# Patient Record
Sex: Female | Born: 1950
Health system: Southern US, Community
[De-identification: ages and names within clinical notes are randomized; demographics above are authoritative.]

## PROBLEM LIST (undated history)

## (undated) DIAGNOSIS — E7800 Pure hypercholesterolemia, unspecified: Secondary | ICD-10-CM

## (undated) DIAGNOSIS — E039 Hypothyroidism, unspecified: Secondary | ICD-10-CM

## (undated) DIAGNOSIS — E079 Disorder of thyroid, unspecified: Secondary | ICD-10-CM

## (undated) DIAGNOSIS — E871 Hypo-osmolality and hyponatremia: Secondary | ICD-10-CM

## (undated) DIAGNOSIS — G3109 Other frontotemporal dementia: Secondary | ICD-10-CM

## (undated) DIAGNOSIS — F0390 Unspecified dementia without behavioral disturbance: Secondary | ICD-10-CM

## (undated) DIAGNOSIS — E119 Type 2 diabetes mellitus without complications: Secondary | ICD-10-CM

## (undated) DIAGNOSIS — F02818 Dementia in other diseases classified elsewhere, unspecified severity, with other behavioral disturbance: Secondary | ICD-10-CM

## (undated) DIAGNOSIS — I10 Essential (primary) hypertension: Secondary | ICD-10-CM

## (undated) HISTORY — DX: Hypothyroidism, unspecified: E03.9

## (undated) HISTORY — DX: Hypo-osmolality and hyponatremia: E87.1

## (undated) HISTORY — DX: Other frontotemporal neurocognitive disorder: G31.09

## (undated) HISTORY — DX: Essential (primary) hypertension: I10

## (undated) HISTORY — DX: Unspecified dementia, unspecified severity, without behavioral disturbance, psychotic disturbance, mood disturbance, and anxiety: F03.90

## (undated) HISTORY — DX: Dementia in other diseases classified elsewhere, unspecified severity, with other behavioral disturbance: F02.818

## (undated) HISTORY — DX: Pure hypercholesterolemia, unspecified: E78.00

---

## 2015-04-21 DIAGNOSIS — Z1289 Encounter for screening for malignant neoplasm of other sites: Secondary | ICD-10-CM | POA: Diagnosis not present

## 2015-04-21 DIAGNOSIS — E7800 Pure hypercholesterolemia, unspecified: Secondary | ICD-10-CM | POA: Diagnosis not present

## 2015-04-21 DIAGNOSIS — E119 Type 2 diabetes mellitus without complications: Secondary | ICD-10-CM | POA: Diagnosis not present

## 2015-04-21 DIAGNOSIS — R319 Hematuria, unspecified: Secondary | ICD-10-CM | POA: Diagnosis not present

## 2015-04-21 DIAGNOSIS — I10 Essential (primary) hypertension: Secondary | ICD-10-CM | POA: Diagnosis not present

## 2015-04-21 DIAGNOSIS — E039 Hypothyroidism, unspecified: Secondary | ICD-10-CM | POA: Diagnosis not present

## 2015-04-21 DIAGNOSIS — K5790 Diverticulosis of intestine, part unspecified, without perforation or abscess without bleeding: Secondary | ICD-10-CM | POA: Diagnosis not present

## 2015-04-21 DIAGNOSIS — Z0000 Encounter for general adult medical examination without abnormal findings: Secondary | ICD-10-CM | POA: Diagnosis not present

## 2015-04-21 DIAGNOSIS — K219 Gastro-esophageal reflux disease without esophagitis: Secondary | ICD-10-CM | POA: Diagnosis not present

## 2015-07-27 DIAGNOSIS — S0501XA Injury of conjunctiva and corneal abrasion without foreign body, right eye, initial encounter: Secondary | ICD-10-CM | POA: Diagnosis not present

## 2015-07-28 DIAGNOSIS — S0501XD Injury of conjunctiva and corneal abrasion without foreign body, right eye, subsequent encounter: Secondary | ICD-10-CM | POA: Diagnosis not present

## 2015-07-31 DIAGNOSIS — B0052 Herpesviral keratitis: Secondary | ICD-10-CM | POA: Diagnosis not present

## 2015-08-02 DIAGNOSIS — B0052 Herpesviral keratitis: Secondary | ICD-10-CM | POA: Diagnosis not present

## 2015-08-24 DIAGNOSIS — E119 Type 2 diabetes mellitus without complications: Secondary | ICD-10-CM | POA: Diagnosis not present

## 2015-08-24 DIAGNOSIS — D696 Thrombocytopenia, unspecified: Secondary | ICD-10-CM | POA: Diagnosis not present

## 2015-08-24 DIAGNOSIS — E039 Hypothyroidism, unspecified: Secondary | ICD-10-CM | POA: Diagnosis not present

## 2015-08-24 DIAGNOSIS — I10 Essential (primary) hypertension: Secondary | ICD-10-CM | POA: Diagnosis not present

## 2015-11-18 DIAGNOSIS — Z23 Encounter for immunization: Secondary | ICD-10-CM | POA: Diagnosis not present

## 2015-12-31 DIAGNOSIS — E119 Type 2 diabetes mellitus without complications: Secondary | ICD-10-CM | POA: Diagnosis not present

## 2015-12-31 DIAGNOSIS — E7800 Pure hypercholesterolemia, unspecified: Secondary | ICD-10-CM | POA: Diagnosis not present

## 2015-12-31 DIAGNOSIS — E039 Hypothyroidism, unspecified: Secondary | ICD-10-CM | POA: Diagnosis not present

## 2015-12-31 DIAGNOSIS — I10 Essential (primary) hypertension: Secondary | ICD-10-CM | POA: Diagnosis not present

## 2015-12-31 DIAGNOSIS — D696 Thrombocytopenia, unspecified: Secondary | ICD-10-CM | POA: Diagnosis not present

## 2016-12-07 DIAGNOSIS — E119 Type 2 diabetes mellitus without complications: Secondary | ICD-10-CM | POA: Diagnosis not present

## 2016-12-07 DIAGNOSIS — Z23 Encounter for immunization: Secondary | ICD-10-CM | POA: Diagnosis not present

## 2016-12-07 DIAGNOSIS — E7800 Pure hypercholesterolemia, unspecified: Secondary | ICD-10-CM | POA: Diagnosis not present

## 2016-12-07 DIAGNOSIS — I10 Essential (primary) hypertension: Secondary | ICD-10-CM | POA: Diagnosis not present

## 2016-12-07 DIAGNOSIS — E039 Hypothyroidism, unspecified: Secondary | ICD-10-CM | POA: Diagnosis not present

## 2016-12-07 DIAGNOSIS — Z0000 Encounter for general adult medical examination without abnormal findings: Secondary | ICD-10-CM | POA: Diagnosis not present

## 2017-01-19 DIAGNOSIS — R5383 Other fatigue: Secondary | ICD-10-CM | POA: Diagnosis not present

## 2017-01-19 DIAGNOSIS — E039 Hypothyroidism, unspecified: Secondary | ICD-10-CM | POA: Diagnosis not present

## 2017-01-19 DIAGNOSIS — E119 Type 2 diabetes mellitus without complications: Secondary | ICD-10-CM | POA: Diagnosis not present

## 2017-01-19 DIAGNOSIS — E7800 Pure hypercholesterolemia, unspecified: Secondary | ICD-10-CM | POA: Diagnosis not present

## 2017-01-19 DIAGNOSIS — I10 Essential (primary) hypertension: Secondary | ICD-10-CM | POA: Diagnosis not present

## 2017-08-21 ENCOUNTER — Other Ambulatory Visit: Payer: Self-pay

## 2017-08-21 NOTE — Patient Outreach (Signed)
[facility] Care Management  08/21/2017  Alexis Becker 01-Jan-1951 [id number]   Medication Adherence call to Alexis Becker left a message for patient to call back patient is due on Metformin Er 500 mg and Simvastatin 20 mg. Alexis Becker is showing past due under [facility].  Alexis AbedAna Ollison-Moran CPhT Pharmacy Technician [facility] Care Management Direct Dial [phone]  Fax [phone] [email or web address]

## 2017-09-20 DIAGNOSIS — E1169 Type 2 diabetes mellitus with other specified complication: Secondary | ICD-10-CM | POA: Diagnosis not present

## 2017-09-20 DIAGNOSIS — R5383 Other fatigue: Secondary | ICD-10-CM | POA: Diagnosis not present

## 2017-09-20 DIAGNOSIS — D696 Thrombocytopenia, unspecified: Secondary | ICD-10-CM | POA: Diagnosis not present

## 2017-09-20 DIAGNOSIS — E039 Hypothyroidism, unspecified: Secondary | ICD-10-CM | POA: Diagnosis not present

## 2017-09-20 DIAGNOSIS — E7800 Pure hypercholesterolemia, unspecified: Secondary | ICD-10-CM | POA: Diagnosis not present

## 2017-09-20 DIAGNOSIS — I10 Essential (primary) hypertension: Secondary | ICD-10-CM | POA: Diagnosis not present

## 2017-10-11 DIAGNOSIS — Z1231 Encounter for screening mammogram for malignant neoplasm of breast: Secondary | ICD-10-CM | POA: Diagnosis not present

## 2017-10-11 DIAGNOSIS — Z89619 Acquired absence of unspecified leg above knee: Secondary | ICD-10-CM | POA: Diagnosis not present

## 2017-10-11 DIAGNOSIS — E1151 Type 2 diabetes mellitus with diabetic peripheral angiopathy without gangrene: Secondary | ICD-10-CM | POA: Diagnosis not present

## 2017-10-22 ENCOUNTER — Other Ambulatory Visit: Payer: Self-pay

## 2017-10-22 NOTE — Patient Outreach (Signed)
[facility]) Care Management  10/22/2017  Alexis Becker [birth year] [id number]   Medication Adherence call to Alexis Becker left a message for patient to call back patient is due on Metformin Er 500 mg and Simvastatin 20 mg. Patient is showing past due under [facility] Ins.   Lillia Abed CPhT Pharmacy Technician [facility] Care Management Direct Dial [phone]  Fax [phone] [name]

## 2017-10-26 DIAGNOSIS — I63511 Cerebral infarction due to unspecified occlusion or stenosis of right middle cerebral artery: Secondary | ICD-10-CM | POA: Diagnosis not present

## 2017-10-26 DIAGNOSIS — G9340 Encephalopathy, unspecified: Secondary | ICD-10-CM | POA: Diagnosis not present

## 2017-10-26 DIAGNOSIS — G319 Degenerative disease of nervous system, unspecified: Secondary | ICD-10-CM | POA: Diagnosis not present

## 2017-12-20 DIAGNOSIS — Z0000 Encounter for general adult medical examination without abnormal findings: Secondary | ICD-10-CM | POA: Diagnosis not present

## 2017-12-20 DIAGNOSIS — E1169 Type 2 diabetes mellitus with other specified complication: Secondary | ICD-10-CM | POA: Diagnosis not present

## 2017-12-20 DIAGNOSIS — Z23 Encounter for immunization: Secondary | ICD-10-CM | POA: Diagnosis not present

## 2017-12-20 DIAGNOSIS — E039 Hypothyroidism, unspecified: Secondary | ICD-10-CM | POA: Diagnosis not present

## 2017-12-20 DIAGNOSIS — E7800 Pure hypercholesterolemia, unspecified: Secondary | ICD-10-CM | POA: Diagnosis not present

## 2018-02-22 DIAGNOSIS — D696 Thrombocytopenia, unspecified: Secondary | ICD-10-CM | POA: Diagnosis not present

## 2018-02-22 DIAGNOSIS — E1169 Type 2 diabetes mellitus with other specified complication: Secondary | ICD-10-CM | POA: Diagnosis not present

## 2018-02-22 DIAGNOSIS — E039 Hypothyroidism, unspecified: Secondary | ICD-10-CM | POA: Diagnosis not present

## 2018-02-22 DIAGNOSIS — E7800 Pure hypercholesterolemia, unspecified: Secondary | ICD-10-CM | POA: Diagnosis not present

## 2018-02-22 DIAGNOSIS — R5383 Other fatigue: Secondary | ICD-10-CM | POA: Diagnosis not present

## 2018-02-22 DIAGNOSIS — I10 Essential (primary) hypertension: Secondary | ICD-10-CM | POA: Diagnosis not present

## 2018-02-22 DIAGNOSIS — B356 Tinea cruris: Secondary | ICD-10-CM | POA: Diagnosis not present

## 2018-03-06 DIAGNOSIS — L03314 Cellulitis of groin: Secondary | ICD-10-CM | POA: Diagnosis not present

## 2018-03-06 DIAGNOSIS — J189 Pneumonia, unspecified organism: Secondary | ICD-10-CM | POA: Diagnosis not present

## 2018-03-06 DIAGNOSIS — R112 Nausea with vomiting, unspecified: Secondary | ICD-10-CM | POA: Diagnosis not present

## 2018-03-06 DIAGNOSIS — R9089 Other abnormal findings on diagnostic imaging of central nervous system: Secondary | ICD-10-CM | POA: Diagnosis not present

## 2018-03-06 DIAGNOSIS — R509 Fever, unspecified: Secondary | ICD-10-CM | POA: Diagnosis not present

## 2018-03-06 DIAGNOSIS — Z7984 Long term (current) use of oral hypoglycemic drugs: Secondary | ICD-10-CM | POA: Diagnosis not present

## 2018-03-06 DIAGNOSIS — I10 Essential (primary) hypertension: Secondary | ICD-10-CM | POA: Diagnosis not present

## 2018-03-06 DIAGNOSIS — E1151 Type 2 diabetes mellitus with diabetic peripheral angiopathy without gangrene: Secondary | ICD-10-CM | POA: Diagnosis not present

## 2018-03-06 DIAGNOSIS — E871 Hypo-osmolality and hyponatremia: Secondary | ICD-10-CM | POA: Diagnosis not present

## 2018-03-06 DIAGNOSIS — E512 Wernicke's encephalopathy: Secondary | ICD-10-CM | POA: Diagnosis not present

## 2018-03-06 DIAGNOSIS — L03115 Cellulitis of right lower limb: Secondary | ICD-10-CM | POA: Diagnosis not present

## 2018-03-06 DIAGNOSIS — E785 Hyperlipidemia, unspecified: Secondary | ICD-10-CM | POA: Diagnosis not present

## 2018-03-06 DIAGNOSIS — Z7982 Long term (current) use of aspirin: Secondary | ICD-10-CM | POA: Diagnosis not present

## 2018-03-06 DIAGNOSIS — M79604 Pain in right leg: Secondary | ICD-10-CM | POA: Diagnosis not present

## 2018-03-06 DIAGNOSIS — Z87891 Personal history of nicotine dependence: Secondary | ICD-10-CM | POA: Diagnosis not present

## 2018-03-06 DIAGNOSIS — Z66 Do not resuscitate: Secondary | ICD-10-CM | POA: Diagnosis not present

## 2018-03-06 DIAGNOSIS — L03319 Cellulitis of trunk, unspecified: Secondary | ICD-10-CM | POA: Diagnosis not present

## 2018-03-06 DIAGNOSIS — Z743 Need for continuous supervision: Secondary | ICD-10-CM | POA: Diagnosis not present

## 2018-03-06 DIAGNOSIS — R410 Disorientation, unspecified: Secondary | ICD-10-CM | POA: Diagnosis not present

## 2018-03-06 DIAGNOSIS — Z89612 Acquired absence of left leg above knee: Secondary | ICD-10-CM | POA: Diagnosis not present

## 2018-03-06 DIAGNOSIS — R0902 Hypoxemia: Secondary | ICD-10-CM | POA: Diagnosis not present

## 2018-03-06 DIAGNOSIS — E1165 Type 2 diabetes mellitus with hyperglycemia: Secondary | ICD-10-CM | POA: Diagnosis not present

## 2018-03-06 DIAGNOSIS — M79605 Pain in left leg: Secondary | ICD-10-CM | POA: Diagnosis not present

## 2018-03-06 DIAGNOSIS — E039 Hypothyroidism, unspecified: Secondary | ICD-10-CM | POA: Diagnosis not present

## 2018-03-06 DIAGNOSIS — Z79899 Other long term (current) drug therapy: Secondary | ICD-10-CM | POA: Diagnosis not present

## 2018-03-06 DIAGNOSIS — Z89619 Acquired absence of unspecified leg above knee: Secondary | ICD-10-CM | POA: Diagnosis not present

## 2018-03-06 DIAGNOSIS — R404 Transient alteration of awareness: Secondary | ICD-10-CM | POA: Diagnosis not present

## 2018-03-06 DIAGNOSIS — E876 Hypokalemia: Secondary | ICD-10-CM | POA: Diagnosis not present

## 2018-03-27 DIAGNOSIS — Z9181 History of falling: Secondary | ICD-10-CM | POA: Diagnosis not present

## 2018-03-27 DIAGNOSIS — M6281 Muscle weakness (generalized): Secondary | ICD-10-CM | POA: Diagnosis not present

## 2018-03-27 DIAGNOSIS — R278 Other lack of coordination: Secondary | ICD-10-CM | POA: Diagnosis not present

## 2018-03-28 DIAGNOSIS — R2681 Unsteadiness on feet: Secondary | ICD-10-CM | POA: Diagnosis not present

## 2018-03-28 DIAGNOSIS — Z9181 History of falling: Secondary | ICD-10-CM | POA: Diagnosis not present

## 2018-03-28 DIAGNOSIS — R278 Other lack of coordination: Secondary | ICD-10-CM | POA: Diagnosis not present

## 2018-03-28 DIAGNOSIS — R488 Other symbolic dysfunctions: Secondary | ICD-10-CM | POA: Diagnosis not present

## 2018-03-29 DIAGNOSIS — R278 Other lack of coordination: Secondary | ICD-10-CM | POA: Diagnosis not present

## 2018-03-29 DIAGNOSIS — Z9181 History of falling: Secondary | ICD-10-CM | POA: Diagnosis not present

## 2018-03-29 DIAGNOSIS — M6281 Muscle weakness (generalized): Secondary | ICD-10-CM | POA: Diagnosis not present

## 2018-04-01 DIAGNOSIS — M6281 Muscle weakness (generalized): Secondary | ICD-10-CM | POA: Diagnosis not present

## 2018-04-01 DIAGNOSIS — R278 Other lack of coordination: Secondary | ICD-10-CM | POA: Diagnosis not present

## 2018-04-01 DIAGNOSIS — Z9181 History of falling: Secondary | ICD-10-CM | POA: Diagnosis not present

## 2018-04-02 DIAGNOSIS — Z9181 History of falling: Secondary | ICD-10-CM | POA: Diagnosis not present

## 2018-04-02 DIAGNOSIS — R488 Other symbolic dysfunctions: Secondary | ICD-10-CM | POA: Diagnosis not present

## 2018-04-02 DIAGNOSIS — R278 Other lack of coordination: Secondary | ICD-10-CM | POA: Diagnosis not present

## 2018-04-02 DIAGNOSIS — R2681 Unsteadiness on feet: Secondary | ICD-10-CM | POA: Diagnosis not present

## 2018-04-03 DIAGNOSIS — R488 Other symbolic dysfunctions: Secondary | ICD-10-CM | POA: Diagnosis not present

## 2018-04-03 DIAGNOSIS — Z9181 History of falling: Secondary | ICD-10-CM | POA: Diagnosis not present

## 2018-04-03 DIAGNOSIS — R278 Other lack of coordination: Secondary | ICD-10-CM | POA: Diagnosis not present

## 2018-04-03 DIAGNOSIS — R2681 Unsteadiness on feet: Secondary | ICD-10-CM | POA: Diagnosis not present

## 2018-04-03 DIAGNOSIS — Z89512 Acquired absence of left leg below knee: Secondary | ICD-10-CM | POA: Diagnosis not present

## 2018-04-03 DIAGNOSIS — Z993 Dependence on wheelchair: Secondary | ICD-10-CM | POA: Diagnosis not present

## 2018-04-04 DIAGNOSIS — Z9181 History of falling: Secondary | ICD-10-CM | POA: Diagnosis not present

## 2018-04-04 DIAGNOSIS — M6281 Muscle weakness (generalized): Secondary | ICD-10-CM | POA: Diagnosis not present

## 2018-04-04 DIAGNOSIS — R278 Other lack of coordination: Secondary | ICD-10-CM | POA: Diagnosis not present

## 2018-04-05 DIAGNOSIS — Z9181 History of falling: Secondary | ICD-10-CM | POA: Diagnosis not present

## 2018-04-05 DIAGNOSIS — M6281 Muscle weakness (generalized): Secondary | ICD-10-CM | POA: Diagnosis not present

## 2018-04-05 DIAGNOSIS — R278 Other lack of coordination: Secondary | ICD-10-CM | POA: Diagnosis not present

## 2018-04-08 DIAGNOSIS — M6281 Muscle weakness (generalized): Secondary | ICD-10-CM | POA: Diagnosis not present

## 2018-04-08 DIAGNOSIS — R278 Other lack of coordination: Secondary | ICD-10-CM | POA: Diagnosis not present

## 2018-04-08 DIAGNOSIS — Z9181 History of falling: Secondary | ICD-10-CM | POA: Diagnosis not present

## 2018-04-09 DIAGNOSIS — R278 Other lack of coordination: Secondary | ICD-10-CM | POA: Diagnosis not present

## 2018-04-09 DIAGNOSIS — R2681 Unsteadiness on feet: Secondary | ICD-10-CM | POA: Diagnosis not present

## 2018-04-09 DIAGNOSIS — R488 Other symbolic dysfunctions: Secondary | ICD-10-CM | POA: Diagnosis not present

## 2018-04-09 DIAGNOSIS — Z9181 History of falling: Secondary | ICD-10-CM | POA: Diagnosis not present

## 2018-04-10 DIAGNOSIS — R2681 Unsteadiness on feet: Secondary | ICD-10-CM | POA: Diagnosis not present

## 2018-04-10 DIAGNOSIS — R278 Other lack of coordination: Secondary | ICD-10-CM | POA: Diagnosis not present

## 2018-04-10 DIAGNOSIS — Z9181 History of falling: Secondary | ICD-10-CM | POA: Diagnosis not present

## 2018-04-10 DIAGNOSIS — R488 Other symbolic dysfunctions: Secondary | ICD-10-CM | POA: Diagnosis not present

## 2018-04-11 DIAGNOSIS — M6281 Muscle weakness (generalized): Secondary | ICD-10-CM | POA: Diagnosis not present

## 2018-04-11 DIAGNOSIS — Z9181 History of falling: Secondary | ICD-10-CM | POA: Diagnosis not present

## 2018-04-11 DIAGNOSIS — R278 Other lack of coordination: Secondary | ICD-10-CM | POA: Diagnosis not present

## 2018-04-12 DIAGNOSIS — Z9181 History of falling: Secondary | ICD-10-CM | POA: Diagnosis not present

## 2018-04-12 DIAGNOSIS — R278 Other lack of coordination: Secondary | ICD-10-CM | POA: Diagnosis not present

## 2018-04-12 DIAGNOSIS — M6281 Muscle weakness (generalized): Secondary | ICD-10-CM | POA: Diagnosis not present

## 2018-04-15 DIAGNOSIS — Z9181 History of falling: Secondary | ICD-10-CM | POA: Diagnosis not present

## 2018-04-15 DIAGNOSIS — M6281 Muscle weakness (generalized): Secondary | ICD-10-CM | POA: Diagnosis not present

## 2018-04-15 DIAGNOSIS — R278 Other lack of coordination: Secondary | ICD-10-CM | POA: Diagnosis not present

## 2018-04-16 DIAGNOSIS — R2681 Unsteadiness on feet: Secondary | ICD-10-CM | POA: Diagnosis not present

## 2018-04-16 DIAGNOSIS — Z9181 History of falling: Secondary | ICD-10-CM | POA: Diagnosis not present

## 2018-04-16 DIAGNOSIS — R488 Other symbolic dysfunctions: Secondary | ICD-10-CM | POA: Diagnosis not present

## 2018-04-16 DIAGNOSIS — R278 Other lack of coordination: Secondary | ICD-10-CM | POA: Diagnosis not present

## 2018-04-17 DIAGNOSIS — R1111 Vomiting without nausea: Secondary | ICD-10-CM | POA: Diagnosis not present

## 2018-04-17 DIAGNOSIS — R2681 Unsteadiness on feet: Secondary | ICD-10-CM | POA: Diagnosis not present

## 2018-04-17 DIAGNOSIS — R488 Other symbolic dysfunctions: Secondary | ICD-10-CM | POA: Diagnosis not present

## 2018-04-17 DIAGNOSIS — Z9181 History of falling: Secondary | ICD-10-CM | POA: Diagnosis not present

## 2018-04-17 DIAGNOSIS — R278 Other lack of coordination: Secondary | ICD-10-CM | POA: Diagnosis not present

## 2018-04-17 DIAGNOSIS — K2900 Acute gastritis without bleeding: Secondary | ICD-10-CM | POA: Diagnosis not present

## 2018-04-18 DIAGNOSIS — Z79899 Other long term (current) drug therapy: Secondary | ICD-10-CM | POA: Diagnosis not present

## 2018-04-18 DIAGNOSIS — R278 Other lack of coordination: Secondary | ICD-10-CM | POA: Diagnosis not present

## 2018-04-18 DIAGNOSIS — M6281 Muscle weakness (generalized): Secondary | ICD-10-CM | POA: Diagnosis not present

## 2018-04-18 DIAGNOSIS — Z9181 History of falling: Secondary | ICD-10-CM | POA: Diagnosis not present

## 2018-04-23 DIAGNOSIS — Z89619 Acquired absence of unspecified leg above knee: Secondary | ICD-10-CM | POA: Diagnosis not present

## 2018-05-06 DIAGNOSIS — B372 Candidiasis of skin and nail: Secondary | ICD-10-CM | POA: Diagnosis not present

## 2018-05-06 DIAGNOSIS — E1159 Type 2 diabetes mellitus with other circulatory complications: Secondary | ICD-10-CM | POA: Diagnosis not present

## 2018-05-09 DIAGNOSIS — E039 Hypothyroidism, unspecified: Secondary | ICD-10-CM | POA: Diagnosis not present

## 2018-05-09 DIAGNOSIS — E1159 Type 2 diabetes mellitus with other circulatory complications: Secondary | ICD-10-CM | POA: Diagnosis not present

## 2018-05-24 DIAGNOSIS — Z89619 Acquired absence of unspecified leg above knee: Secondary | ICD-10-CM | POA: Diagnosis not present

## 2018-06-05 ENCOUNTER — Other Ambulatory Visit: Payer: Self-pay

## 2018-06-05 NOTE — Patient Outreach (Signed)
[facility]) Care Management  06/05/2018  Marcayla Burkey July 26, 1950 [id number]   Medication Adherence call to [name]  HIPPA Compliant Voice message left with a call back number. Mrs. Ray is showing past due on Simvastatin 20 mg and Metformin 500 mg under [employer] Ins.   Lillia Abed CPhT Pharmacy Technician [facility] Care Management Direct Dial [phone]  Fax [phone] [email or web address]

## 2018-06-23 DIAGNOSIS — Z89619 Acquired absence of unspecified leg above knee: Secondary | ICD-10-CM | POA: Diagnosis not present

## 2018-07-22 DIAGNOSIS — E039 Hypothyroidism, unspecified: Secondary | ICD-10-CM | POA: Diagnosis not present

## 2018-07-22 DIAGNOSIS — E1159 Type 2 diabetes mellitus with other circulatory complications: Secondary | ICD-10-CM | POA: Diagnosis not present

## 2018-07-24 DIAGNOSIS — Z89619 Acquired absence of unspecified leg above knee: Secondary | ICD-10-CM | POA: Diagnosis not present

## 2018-08-09 ENCOUNTER — Other Ambulatory Visit: Payer: Self-pay

## 2018-08-09 NOTE — Patient Outreach (Signed)
[facility]) Care Management  08/09/2018  Shree Espey May 12, 1950 [id number]   Medication Adherence call to Mrs. [place] Compliant Voice message left with a call back number. Mrs. Petrakis is showing past due on Simvastatin 20 mg under [place].   [facility] Management Direct Dial [phone]  Fax [phone] [email or web address]

## 2018-08-23 DIAGNOSIS — Z89619 Acquired absence of unspecified leg above knee: Secondary | ICD-10-CM | POA: Diagnosis not present

## 2018-09-04 DIAGNOSIS — Z1159 Encounter for screening for other viral diseases: Secondary | ICD-10-CM | POA: Diagnosis not present

## 2018-09-05 ENCOUNTER — Other Ambulatory Visit: Payer: Self-pay

## 2018-09-05 NOTE — Patient Outreach (Signed)
[facility]) Care Management  09/05/2018  Kynzli Rease 1950/10/05 [id number]   Medication Adherence call to Mrs. [place] Compliant Voice message left with a call back number. Mrs. Heidt is showing past due on Simvastatin 20 mg under [place].   [facility] Management Direct Dial [phone]  Fax [phone] [email or web address]

## 2018-09-09 DIAGNOSIS — U071 COVID-19: Secondary | ICD-10-CM | POA: Diagnosis not present

## 2018-09-09 DIAGNOSIS — R0902 Hypoxemia: Secondary | ICD-10-CM | POA: Diagnosis not present

## 2018-09-11 DIAGNOSIS — Z1159 Encounter for screening for other viral diseases: Secondary | ICD-10-CM | POA: Diagnosis not present

## 2018-09-16 DIAGNOSIS — Z1159 Encounter for screening for other viral diseases: Secondary | ICD-10-CM | POA: Diagnosis not present

## 2018-09-23 DIAGNOSIS — Z03818 Encounter for observation for suspected exposure to other biological agents ruled out: Secondary | ICD-10-CM | POA: Diagnosis not present

## 2018-09-23 DIAGNOSIS — Z89619 Acquired absence of unspecified leg above knee: Secondary | ICD-10-CM | POA: Diagnosis not present

## 2018-09-23 DIAGNOSIS — F5101 Primary insomnia: Secondary | ICD-10-CM | POA: Diagnosis not present

## 2018-09-23 DIAGNOSIS — E1159 Type 2 diabetes mellitus with other circulatory complications: Secondary | ICD-10-CM | POA: Diagnosis not present

## 2018-09-23 DIAGNOSIS — Z79899 Other long term (current) drug therapy: Secondary | ICD-10-CM | POA: Diagnosis not present

## 2018-09-26 DIAGNOSIS — Z1159 Encounter for screening for other viral diseases: Secondary | ICD-10-CM | POA: Diagnosis not present

## 2018-10-24 DIAGNOSIS — Z89619 Acquired absence of unspecified leg above knee: Secondary | ICD-10-CM | POA: Diagnosis not present

## 2018-10-28 DIAGNOSIS — Z712 Person consulting for explanation of examination or test findings: Secondary | ICD-10-CM | POA: Diagnosis not present

## 2018-10-28 DIAGNOSIS — E538 Deficiency of other specified B group vitamins: Secondary | ICD-10-CM | POA: Diagnosis not present

## 2018-10-28 DIAGNOSIS — E1159 Type 2 diabetes mellitus with other circulatory complications: Secondary | ICD-10-CM | POA: Diagnosis not present

## 2019-02-17 DIAGNOSIS — Z1159 Encounter for screening for other viral diseases: Secondary | ICD-10-CM | POA: Diagnosis not present

## 2019-02-20 DIAGNOSIS — Z1159 Encounter for screening for other viral diseases: Secondary | ICD-10-CM | POA: Diagnosis not present

## 2019-02-24 DIAGNOSIS — Z1159 Encounter for screening for other viral diseases: Secondary | ICD-10-CM | POA: Diagnosis not present

## 2019-02-27 DIAGNOSIS — Z1159 Encounter for screening for other viral diseases: Secondary | ICD-10-CM | POA: Diagnosis not present

## 2019-03-03 DIAGNOSIS — Z1159 Encounter for screening for other viral diseases: Secondary | ICD-10-CM | POA: Diagnosis not present

## 2019-03-06 DIAGNOSIS — Z1159 Encounter for screening for other viral diseases: Secondary | ICD-10-CM | POA: Diagnosis not present

## 2019-03-10 DIAGNOSIS — Z1159 Encounter for screening for other viral diseases: Secondary | ICD-10-CM | POA: Diagnosis not present

## 2019-03-13 DIAGNOSIS — Z1159 Encounter for screening for other viral diseases: Secondary | ICD-10-CM | POA: Diagnosis not present

## 2019-03-17 DIAGNOSIS — E1159 Type 2 diabetes mellitus with other circulatory complications: Secondary | ICD-10-CM | POA: Diagnosis not present

## 2019-03-17 DIAGNOSIS — I10 Essential (primary) hypertension: Secondary | ICD-10-CM | POA: Diagnosis not present

## 2019-03-17 DIAGNOSIS — Z1159 Encounter for screening for other viral diseases: Secondary | ICD-10-CM | POA: Diagnosis not present

## 2019-03-17 DIAGNOSIS — Z79899 Other long term (current) drug therapy: Secondary | ICD-10-CM | POA: Diagnosis not present

## 2019-03-20 ENCOUNTER — Other Ambulatory Visit: Payer: Self-pay

## 2019-03-20 DIAGNOSIS — Z1159 Encounter for screening for other viral diseases: Secondary | ICD-10-CM | POA: Diagnosis not present

## 2019-03-20 NOTE — Patient Outreach (Signed)
[facility]) Care Management  03/20/2019  Alexis Becker [birth year] [id number]  Medication Adherence call to Alexis Becker HIPPA Compliant Voice message left with a call back number. Alexis Becker is showing past due on Simvastatin 20 mg and Pioglitazone 15 mg under [employer] Ins.   Alexis Becker CPhT Pharmacy Technician [facility] Management Direct Dial [phone]  Fax [phone] [email or web address]

## 2019-03-24 DIAGNOSIS — Z1159 Encounter for screening for other viral diseases: Secondary | ICD-10-CM | POA: Diagnosis not present

## 2019-03-27 DIAGNOSIS — Z1159 Encounter for screening for other viral diseases: Secondary | ICD-10-CM | POA: Diagnosis not present

## 2019-03-31 DIAGNOSIS — Z1159 Encounter for screening for other viral diseases: Secondary | ICD-10-CM | POA: Diagnosis not present

## 2019-04-03 DIAGNOSIS — Z1159 Encounter for screening for other viral diseases: Secondary | ICD-10-CM | POA: Diagnosis not present

## 2019-04-07 DIAGNOSIS — E039 Hypothyroidism, unspecified: Secondary | ICD-10-CM | POA: Diagnosis not present

## 2019-04-07 DIAGNOSIS — Z1159 Encounter for screening for other viral diseases: Secondary | ICD-10-CM | POA: Diagnosis not present

## 2019-04-07 DIAGNOSIS — E7849 Other hyperlipidemia: Secondary | ICD-10-CM | POA: Diagnosis not present

## 2019-04-07 DIAGNOSIS — K5909 Other constipation: Secondary | ICD-10-CM | POA: Diagnosis not present

## 2019-04-10 DIAGNOSIS — Z1159 Encounter for screening for other viral diseases: Secondary | ICD-10-CM | POA: Diagnosis not present

## 2019-04-14 DIAGNOSIS — Z1159 Encounter for screening for other viral diseases: Secondary | ICD-10-CM | POA: Diagnosis not present

## 2019-04-17 DIAGNOSIS — Z1159 Encounter for screening for other viral diseases: Secondary | ICD-10-CM | POA: Diagnosis not present

## 2019-04-21 DIAGNOSIS — Z1159 Encounter for screening for other viral diseases: Secondary | ICD-10-CM | POA: Diagnosis not present

## 2019-04-24 DIAGNOSIS — Z1159 Encounter for screening for other viral diseases: Secondary | ICD-10-CM | POA: Diagnosis not present

## 2019-04-28 DIAGNOSIS — Z1159 Encounter for screening for other viral diseases: Secondary | ICD-10-CM | POA: Diagnosis not present

## 2019-05-01 DIAGNOSIS — Z1159 Encounter for screening for other viral diseases: Secondary | ICD-10-CM | POA: Diagnosis not present

## 2019-05-05 DIAGNOSIS — Z1159 Encounter for screening for other viral diseases: Secondary | ICD-10-CM | POA: Diagnosis not present

## 2019-05-05 DIAGNOSIS — Z79899 Other long term (current) drug therapy: Secondary | ICD-10-CM | POA: Diagnosis not present

## 2019-05-08 DIAGNOSIS — Z1159 Encounter for screening for other viral diseases: Secondary | ICD-10-CM | POA: Diagnosis not present

## 2019-05-12 DIAGNOSIS — Z1159 Encounter for screening for other viral diseases: Secondary | ICD-10-CM | POA: Diagnosis not present

## 2019-05-15 DIAGNOSIS — Z79899 Other long term (current) drug therapy: Secondary | ICD-10-CM | POA: Diagnosis not present

## 2019-05-15 DIAGNOSIS — Z1159 Encounter for screening for other viral diseases: Secondary | ICD-10-CM | POA: Diagnosis not present

## 2019-05-15 DIAGNOSIS — E1159 Type 2 diabetes mellitus with other circulatory complications: Secondary | ICD-10-CM | POA: Diagnosis not present

## 2019-05-19 DIAGNOSIS — Z1159 Encounter for screening for other viral diseases: Secondary | ICD-10-CM | POA: Diagnosis not present

## 2019-05-22 DIAGNOSIS — Z1159 Encounter for screening for other viral diseases: Secondary | ICD-10-CM | POA: Diagnosis not present

## 2019-05-26 DIAGNOSIS — E1165 Type 2 diabetes mellitus with hyperglycemia: Secondary | ICD-10-CM | POA: Diagnosis not present

## 2019-05-26 DIAGNOSIS — Z794 Long term (current) use of insulin: Secondary | ICD-10-CM | POA: Diagnosis not present

## 2019-05-26 DIAGNOSIS — Z712 Person consulting for explanation of examination or test findings: Secondary | ICD-10-CM | POA: Diagnosis not present

## 2019-05-26 DIAGNOSIS — Z1159 Encounter for screening for other viral diseases: Secondary | ICD-10-CM | POA: Diagnosis not present

## 2019-05-29 DIAGNOSIS — Z1159 Encounter for screening for other viral diseases: Secondary | ICD-10-CM | POA: Diagnosis not present

## 2019-06-02 DIAGNOSIS — Z1159 Encounter for screening for other viral diseases: Secondary | ICD-10-CM | POA: Diagnosis not present

## 2019-06-05 DIAGNOSIS — Z1159 Encounter for screening for other viral diseases: Secondary | ICD-10-CM | POA: Diagnosis not present

## 2019-06-09 DIAGNOSIS — Z1159 Encounter for screening for other viral diseases: Secondary | ICD-10-CM | POA: Diagnosis not present

## 2019-06-12 DIAGNOSIS — Z1159 Encounter for screening for other viral diseases: Secondary | ICD-10-CM | POA: Diagnosis not present

## 2019-06-16 DIAGNOSIS — Z1159 Encounter for screening for other viral diseases: Secondary | ICD-10-CM | POA: Diagnosis not present

## 2019-06-16 DIAGNOSIS — Z794 Long term (current) use of insulin: Secondary | ICD-10-CM | POA: Diagnosis not present

## 2019-06-16 DIAGNOSIS — E1165 Type 2 diabetes mellitus with hyperglycemia: Secondary | ICD-10-CM | POA: Diagnosis not present

## 2019-06-16 DIAGNOSIS — S31821A Laceration without foreign body of left buttock, initial encounter: Secondary | ICD-10-CM | POA: Diagnosis not present

## 2019-06-16 DIAGNOSIS — R21 Rash and other nonspecific skin eruption: Secondary | ICD-10-CM | POA: Diagnosis not present

## 2019-06-16 DIAGNOSIS — S31811A Laceration without foreign body of right buttock, initial encounter: Secondary | ICD-10-CM | POA: Diagnosis not present

## 2019-06-19 DIAGNOSIS — Z1159 Encounter for screening for other viral diseases: Secondary | ICD-10-CM | POA: Diagnosis not present

## 2019-06-20 DIAGNOSIS — R21 Rash and other nonspecific skin eruption: Secondary | ICD-10-CM | POA: Diagnosis not present

## 2019-06-20 DIAGNOSIS — Z89612 Acquired absence of left leg above knee: Secondary | ICD-10-CM | POA: Diagnosis not present

## 2019-06-20 DIAGNOSIS — Z794 Long term (current) use of insulin: Secondary | ICD-10-CM | POA: Diagnosis not present

## 2019-06-20 DIAGNOSIS — L89322 Pressure ulcer of left buttock, stage 2: Secondary | ICD-10-CM | POA: Diagnosis not present

## 2019-06-20 DIAGNOSIS — L89312 Pressure ulcer of right buttock, stage 2: Secondary | ICD-10-CM | POA: Diagnosis not present

## 2019-06-20 DIAGNOSIS — E039 Hypothyroidism, unspecified: Secondary | ICD-10-CM | POA: Diagnosis not present

## 2019-06-20 DIAGNOSIS — E119 Type 2 diabetes mellitus without complications: Secondary | ICD-10-CM | POA: Diagnosis not present

## 2019-06-23 DIAGNOSIS — Z1159 Encounter for screening for other viral diseases: Secondary | ICD-10-CM | POA: Diagnosis not present

## 2019-06-26 DIAGNOSIS — Z1159 Encounter for screening for other viral diseases: Secondary | ICD-10-CM | POA: Diagnosis not present

## 2019-06-28 DIAGNOSIS — L89322 Pressure ulcer of left buttock, stage 2: Secondary | ICD-10-CM | POA: Diagnosis not present

## 2019-06-28 DIAGNOSIS — E119 Type 2 diabetes mellitus without complications: Secondary | ICD-10-CM | POA: Diagnosis not present

## 2019-06-28 DIAGNOSIS — Z794 Long term (current) use of insulin: Secondary | ICD-10-CM | POA: Diagnosis not present

## 2019-06-28 DIAGNOSIS — R21 Rash and other nonspecific skin eruption: Secondary | ICD-10-CM | POA: Diagnosis not present

## 2019-06-28 DIAGNOSIS — L89312 Pressure ulcer of right buttock, stage 2: Secondary | ICD-10-CM | POA: Diagnosis not present

## 2019-06-28 DIAGNOSIS — E039 Hypothyroidism, unspecified: Secondary | ICD-10-CM | POA: Diagnosis not present

## 2019-06-28 DIAGNOSIS — Z89612 Acquired absence of left leg above knee: Secondary | ICD-10-CM | POA: Diagnosis not present

## 2019-06-30 DIAGNOSIS — Z1159 Encounter for screening for other viral diseases: Secondary | ICD-10-CM | POA: Diagnosis not present

## 2019-07-03 DIAGNOSIS — Z1159 Encounter for screening for other viral diseases: Secondary | ICD-10-CM | POA: Diagnosis not present

## 2019-07-05 DIAGNOSIS — E119 Type 2 diabetes mellitus without complications: Secondary | ICD-10-CM | POA: Diagnosis not present

## 2019-07-05 DIAGNOSIS — E039 Hypothyroidism, unspecified: Secondary | ICD-10-CM | POA: Diagnosis not present

## 2019-07-05 DIAGNOSIS — L89322 Pressure ulcer of left buttock, stage 2: Secondary | ICD-10-CM | POA: Diagnosis not present

## 2019-07-05 DIAGNOSIS — R21 Rash and other nonspecific skin eruption: Secondary | ICD-10-CM | POA: Diagnosis not present

## 2019-07-05 DIAGNOSIS — Z794 Long term (current) use of insulin: Secondary | ICD-10-CM | POA: Diagnosis not present

## 2019-07-05 DIAGNOSIS — L89312 Pressure ulcer of right buttock, stage 2: Secondary | ICD-10-CM | POA: Diagnosis not present

## 2019-07-05 DIAGNOSIS — Z89612 Acquired absence of left leg above knee: Secondary | ICD-10-CM | POA: Diagnosis not present

## 2019-07-07 DIAGNOSIS — Z1159 Encounter for screening for other viral diseases: Secondary | ICD-10-CM | POA: Diagnosis not present

## 2019-07-10 DIAGNOSIS — Z1159 Encounter for screening for other viral diseases: Secondary | ICD-10-CM | POA: Diagnosis not present

## 2019-07-11 DIAGNOSIS — Z794 Long term (current) use of insulin: Secondary | ICD-10-CM | POA: Diagnosis not present

## 2019-07-11 DIAGNOSIS — L89312 Pressure ulcer of right buttock, stage 2: Secondary | ICD-10-CM | POA: Diagnosis not present

## 2019-07-11 DIAGNOSIS — E119 Type 2 diabetes mellitus without complications: Secondary | ICD-10-CM | POA: Diagnosis not present

## 2019-07-11 DIAGNOSIS — R21 Rash and other nonspecific skin eruption: Secondary | ICD-10-CM | POA: Diagnosis not present

## 2019-07-11 DIAGNOSIS — E039 Hypothyroidism, unspecified: Secondary | ICD-10-CM | POA: Diagnosis not present

## 2019-07-11 DIAGNOSIS — L89322 Pressure ulcer of left buttock, stage 2: Secondary | ICD-10-CM | POA: Diagnosis not present

## 2019-07-11 DIAGNOSIS — Z89612 Acquired absence of left leg above knee: Secondary | ICD-10-CM | POA: Diagnosis not present

## 2019-07-14 DIAGNOSIS — Z1159 Encounter for screening for other viral diseases: Secondary | ICD-10-CM | POA: Diagnosis not present

## 2019-07-15 DIAGNOSIS — Q845 Enlarged and hypertrophic nails: Secondary | ICD-10-CM | POA: Diagnosis not present

## 2019-07-15 DIAGNOSIS — E1159 Type 2 diabetes mellitus with other circulatory complications: Secondary | ICD-10-CM | POA: Diagnosis not present

## 2019-07-15 DIAGNOSIS — L603 Nail dystrophy: Secondary | ICD-10-CM | POA: Diagnosis not present

## 2019-07-15 DIAGNOSIS — B351 Tinea unguium: Secondary | ICD-10-CM | POA: Diagnosis not present

## 2019-07-15 DIAGNOSIS — Z89512 Acquired absence of left leg below knee: Secondary | ICD-10-CM | POA: Diagnosis not present

## 2019-07-17 DIAGNOSIS — Z1159 Encounter for screening for other viral diseases: Secondary | ICD-10-CM | POA: Diagnosis not present

## 2019-07-17 DIAGNOSIS — E039 Hypothyroidism, unspecified: Secondary | ICD-10-CM | POA: Diagnosis not present

## 2019-07-17 DIAGNOSIS — Z794 Long term (current) use of insulin: Secondary | ICD-10-CM | POA: Diagnosis not present

## 2019-07-17 DIAGNOSIS — E119 Type 2 diabetes mellitus without complications: Secondary | ICD-10-CM | POA: Diagnosis not present

## 2019-07-17 DIAGNOSIS — L89322 Pressure ulcer of left buttock, stage 2: Secondary | ICD-10-CM | POA: Diagnosis not present

## 2019-07-17 DIAGNOSIS — Z89612 Acquired absence of left leg above knee: Secondary | ICD-10-CM | POA: Diagnosis not present

## 2019-07-17 DIAGNOSIS — R21 Rash and other nonspecific skin eruption: Secondary | ICD-10-CM | POA: Diagnosis not present

## 2019-07-17 DIAGNOSIS — L89312 Pressure ulcer of right buttock, stage 2: Secondary | ICD-10-CM | POA: Diagnosis not present

## 2019-07-21 DIAGNOSIS — L89312 Pressure ulcer of right buttock, stage 2: Secondary | ICD-10-CM | POA: Diagnosis not present

## 2019-07-21 DIAGNOSIS — R21 Rash and other nonspecific skin eruption: Secondary | ICD-10-CM | POA: Diagnosis not present

## 2019-07-21 DIAGNOSIS — Z1159 Encounter for screening for other viral diseases: Secondary | ICD-10-CM | POA: Diagnosis not present

## 2019-07-21 DIAGNOSIS — L89322 Pressure ulcer of left buttock, stage 2: Secondary | ICD-10-CM | POA: Diagnosis not present

## 2019-07-24 DIAGNOSIS — Z1159 Encounter for screening for other viral diseases: Secondary | ICD-10-CM | POA: Diagnosis not present

## 2019-07-24 DIAGNOSIS — R21 Rash and other nonspecific skin eruption: Secondary | ICD-10-CM | POA: Diagnosis not present

## 2019-07-24 DIAGNOSIS — L89312 Pressure ulcer of right buttock, stage 2: Secondary | ICD-10-CM | POA: Diagnosis not present

## 2019-07-24 DIAGNOSIS — Z794 Long term (current) use of insulin: Secondary | ICD-10-CM | POA: Diagnosis not present

## 2019-07-24 DIAGNOSIS — Z89612 Acquired absence of left leg above knee: Secondary | ICD-10-CM | POA: Diagnosis not present

## 2019-07-24 DIAGNOSIS — E039 Hypothyroidism, unspecified: Secondary | ICD-10-CM | POA: Diagnosis not present

## 2019-07-24 DIAGNOSIS — L89322 Pressure ulcer of left buttock, stage 2: Secondary | ICD-10-CM | POA: Diagnosis not present

## 2019-07-24 DIAGNOSIS — E119 Type 2 diabetes mellitus without complications: Secondary | ICD-10-CM | POA: Diagnosis not present

## 2019-07-28 DIAGNOSIS — Z1159 Encounter for screening for other viral diseases: Secondary | ICD-10-CM | POA: Diagnosis not present

## 2019-07-31 DIAGNOSIS — Z1159 Encounter for screening for other viral diseases: Secondary | ICD-10-CM | POA: Diagnosis not present

## 2019-08-01 DIAGNOSIS — E119 Type 2 diabetes mellitus without complications: Secondary | ICD-10-CM | POA: Diagnosis not present

## 2019-08-01 DIAGNOSIS — L89322 Pressure ulcer of left buttock, stage 2: Secondary | ICD-10-CM | POA: Diagnosis not present

## 2019-08-01 DIAGNOSIS — Z89612 Acquired absence of left leg above knee: Secondary | ICD-10-CM | POA: Diagnosis not present

## 2019-08-01 DIAGNOSIS — L89312 Pressure ulcer of right buttock, stage 2: Secondary | ICD-10-CM | POA: Diagnosis not present

## 2019-08-01 DIAGNOSIS — Z794 Long term (current) use of insulin: Secondary | ICD-10-CM | POA: Diagnosis not present

## 2019-08-01 DIAGNOSIS — R21 Rash and other nonspecific skin eruption: Secondary | ICD-10-CM | POA: Diagnosis not present

## 2019-08-01 DIAGNOSIS — E039 Hypothyroidism, unspecified: Secondary | ICD-10-CM | POA: Diagnosis not present

## 2019-08-04 DIAGNOSIS — Z1159 Encounter for screening for other viral diseases: Secondary | ICD-10-CM | POA: Diagnosis not present

## 2019-08-07 DIAGNOSIS — Z1159 Encounter for screening for other viral diseases: Secondary | ICD-10-CM | POA: Diagnosis not present

## 2019-08-08 DIAGNOSIS — E039 Hypothyroidism, unspecified: Secondary | ICD-10-CM | POA: Diagnosis not present

## 2019-08-08 DIAGNOSIS — R21 Rash and other nonspecific skin eruption: Secondary | ICD-10-CM | POA: Diagnosis not present

## 2019-08-08 DIAGNOSIS — L89312 Pressure ulcer of right buttock, stage 2: Secondary | ICD-10-CM | POA: Diagnosis not present

## 2019-08-08 DIAGNOSIS — E119 Type 2 diabetes mellitus without complications: Secondary | ICD-10-CM | POA: Diagnosis not present

## 2019-08-08 DIAGNOSIS — L89322 Pressure ulcer of left buttock, stage 2: Secondary | ICD-10-CM | POA: Diagnosis not present

## 2019-08-08 DIAGNOSIS — Z89612 Acquired absence of left leg above knee: Secondary | ICD-10-CM | POA: Diagnosis not present

## 2019-08-08 DIAGNOSIS — Z794 Long term (current) use of insulin: Secondary | ICD-10-CM | POA: Diagnosis not present

## 2019-08-11 DIAGNOSIS — E1165 Type 2 diabetes mellitus with hyperglycemia: Secondary | ICD-10-CM | POA: Diagnosis not present

## 2019-08-11 DIAGNOSIS — Z794 Long term (current) use of insulin: Secondary | ICD-10-CM | POA: Diagnosis not present

## 2019-08-11 DIAGNOSIS — Z1159 Encounter for screening for other viral diseases: Secondary | ICD-10-CM | POA: Diagnosis not present

## 2019-08-11 DIAGNOSIS — Z5329 Procedure and treatment not carried out because of patient's decision for other reasons: Secondary | ICD-10-CM | POA: Diagnosis not present

## 2019-08-14 DIAGNOSIS — E119 Type 2 diabetes mellitus without complications: Secondary | ICD-10-CM | POA: Diagnosis not present

## 2019-08-14 DIAGNOSIS — E039 Hypothyroidism, unspecified: Secondary | ICD-10-CM | POA: Diagnosis not present

## 2019-08-14 DIAGNOSIS — Z1159 Encounter for screening for other viral diseases: Secondary | ICD-10-CM | POA: Diagnosis not present

## 2019-08-14 DIAGNOSIS — L89312 Pressure ulcer of right buttock, stage 2: Secondary | ICD-10-CM | POA: Diagnosis not present

## 2019-08-14 DIAGNOSIS — L89322 Pressure ulcer of left buttock, stage 2: Secondary | ICD-10-CM | POA: Diagnosis not present

## 2019-08-14 DIAGNOSIS — Z794 Long term (current) use of insulin: Secondary | ICD-10-CM | POA: Diagnosis not present

## 2019-08-14 DIAGNOSIS — Z89612 Acquired absence of left leg above knee: Secondary | ICD-10-CM | POA: Diagnosis not present

## 2019-08-14 DIAGNOSIS — R21 Rash and other nonspecific skin eruption: Secondary | ICD-10-CM | POA: Diagnosis not present

## 2019-08-18 DIAGNOSIS — Z79899 Other long term (current) drug therapy: Secondary | ICD-10-CM | POA: Diagnosis not present

## 2019-08-18 DIAGNOSIS — Z1159 Encounter for screening for other viral diseases: Secondary | ICD-10-CM | POA: Diagnosis not present

## 2019-08-19 DIAGNOSIS — Z89612 Acquired absence of left leg above knee: Secondary | ICD-10-CM | POA: Diagnosis not present

## 2019-08-19 DIAGNOSIS — R21 Rash and other nonspecific skin eruption: Secondary | ICD-10-CM | POA: Diagnosis not present

## 2019-08-19 DIAGNOSIS — Z794 Long term (current) use of insulin: Secondary | ICD-10-CM | POA: Diagnosis not present

## 2019-08-19 DIAGNOSIS — E119 Type 2 diabetes mellitus without complications: Secondary | ICD-10-CM | POA: Diagnosis not present

## 2019-08-19 DIAGNOSIS — L89322 Pressure ulcer of left buttock, stage 2: Secondary | ICD-10-CM | POA: Diagnosis not present

## 2019-08-19 DIAGNOSIS — E039 Hypothyroidism, unspecified: Secondary | ICD-10-CM | POA: Diagnosis not present

## 2019-08-19 DIAGNOSIS — L89312 Pressure ulcer of right buttock, stage 2: Secondary | ICD-10-CM | POA: Diagnosis not present

## 2019-08-21 DIAGNOSIS — Z1159 Encounter for screening for other viral diseases: Secondary | ICD-10-CM | POA: Diagnosis not present

## 2019-08-22 DIAGNOSIS — Z794 Long term (current) use of insulin: Secondary | ICD-10-CM | POA: Diagnosis not present

## 2019-08-22 DIAGNOSIS — E119 Type 2 diabetes mellitus without complications: Secondary | ICD-10-CM | POA: Diagnosis not present

## 2019-08-22 DIAGNOSIS — E039 Hypothyroidism, unspecified: Secondary | ICD-10-CM | POA: Diagnosis not present

## 2019-08-22 DIAGNOSIS — L89322 Pressure ulcer of left buttock, stage 2: Secondary | ICD-10-CM | POA: Diagnosis not present

## 2019-08-22 DIAGNOSIS — Z89612 Acquired absence of left leg above knee: Secondary | ICD-10-CM | POA: Diagnosis not present

## 2019-08-22 DIAGNOSIS — R21 Rash and other nonspecific skin eruption: Secondary | ICD-10-CM | POA: Diagnosis not present

## 2019-08-22 DIAGNOSIS — L89312 Pressure ulcer of right buttock, stage 2: Secondary | ICD-10-CM | POA: Diagnosis not present

## 2019-08-25 DIAGNOSIS — Z1159 Encounter for screening for other viral diseases: Secondary | ICD-10-CM | POA: Diagnosis not present

## 2019-08-26 DIAGNOSIS — L89312 Pressure ulcer of right buttock, stage 2: Secondary | ICD-10-CM | POA: Diagnosis not present

## 2019-08-26 DIAGNOSIS — L89322 Pressure ulcer of left buttock, stage 2: Secondary | ICD-10-CM | POA: Diagnosis not present

## 2019-08-26 DIAGNOSIS — E039 Hypothyroidism, unspecified: Secondary | ICD-10-CM | POA: Diagnosis not present

## 2019-08-26 DIAGNOSIS — R21 Rash and other nonspecific skin eruption: Secondary | ICD-10-CM | POA: Diagnosis not present

## 2019-08-26 DIAGNOSIS — Z794 Long term (current) use of insulin: Secondary | ICD-10-CM | POA: Diagnosis not present

## 2019-08-26 DIAGNOSIS — Z89612 Acquired absence of left leg above knee: Secondary | ICD-10-CM | POA: Diagnosis not present

## 2019-08-26 DIAGNOSIS — E119 Type 2 diabetes mellitus without complications: Secondary | ICD-10-CM | POA: Diagnosis not present

## 2019-08-28 DIAGNOSIS — Z1159 Encounter for screening for other viral diseases: Secondary | ICD-10-CM | POA: Diagnosis not present

## 2019-08-29 DIAGNOSIS — L89312 Pressure ulcer of right buttock, stage 2: Secondary | ICD-10-CM | POA: Diagnosis not present

## 2019-08-29 DIAGNOSIS — E039 Hypothyroidism, unspecified: Secondary | ICD-10-CM | POA: Diagnosis not present

## 2019-08-29 DIAGNOSIS — Z89612 Acquired absence of left leg above knee: Secondary | ICD-10-CM | POA: Diagnosis not present

## 2019-08-29 DIAGNOSIS — E119 Type 2 diabetes mellitus without complications: Secondary | ICD-10-CM | POA: Diagnosis not present

## 2019-08-29 DIAGNOSIS — L89322 Pressure ulcer of left buttock, stage 2: Secondary | ICD-10-CM | POA: Diagnosis not present

## 2019-08-29 DIAGNOSIS — Z794 Long term (current) use of insulin: Secondary | ICD-10-CM | POA: Diagnosis not present

## 2019-08-29 DIAGNOSIS — R21 Rash and other nonspecific skin eruption: Secondary | ICD-10-CM | POA: Diagnosis not present

## 2019-09-01 DIAGNOSIS — Z1159 Encounter for screening for other viral diseases: Secondary | ICD-10-CM | POA: Diagnosis not present

## 2019-09-03 DIAGNOSIS — L89322 Pressure ulcer of left buttock, stage 2: Secondary | ICD-10-CM | POA: Diagnosis not present

## 2019-09-03 DIAGNOSIS — R21 Rash and other nonspecific skin eruption: Secondary | ICD-10-CM | POA: Diagnosis not present

## 2019-09-03 DIAGNOSIS — Z794 Long term (current) use of insulin: Secondary | ICD-10-CM | POA: Diagnosis not present

## 2019-09-03 DIAGNOSIS — Z89612 Acquired absence of left leg above knee: Secondary | ICD-10-CM | POA: Diagnosis not present

## 2019-09-03 DIAGNOSIS — E039 Hypothyroidism, unspecified: Secondary | ICD-10-CM | POA: Diagnosis not present

## 2019-09-03 DIAGNOSIS — L89312 Pressure ulcer of right buttock, stage 2: Secondary | ICD-10-CM | POA: Diagnosis not present

## 2019-09-03 DIAGNOSIS — E119 Type 2 diabetes mellitus without complications: Secondary | ICD-10-CM | POA: Diagnosis not present

## 2019-09-04 DIAGNOSIS — E119 Type 2 diabetes mellitus without complications: Secondary | ICD-10-CM | POA: Diagnosis not present

## 2019-09-04 DIAGNOSIS — E039 Hypothyroidism, unspecified: Secondary | ICD-10-CM | POA: Diagnosis not present

## 2019-09-04 DIAGNOSIS — L89312 Pressure ulcer of right buttock, stage 2: Secondary | ICD-10-CM | POA: Diagnosis not present

## 2019-09-04 DIAGNOSIS — Z794 Long term (current) use of insulin: Secondary | ICD-10-CM | POA: Diagnosis not present

## 2019-09-04 DIAGNOSIS — Z89612 Acquired absence of left leg above knee: Secondary | ICD-10-CM | POA: Diagnosis not present

## 2019-09-04 DIAGNOSIS — L89322 Pressure ulcer of left buttock, stage 2: Secondary | ICD-10-CM | POA: Diagnosis not present

## 2019-09-04 DIAGNOSIS — R21 Rash and other nonspecific skin eruption: Secondary | ICD-10-CM | POA: Diagnosis not present

## 2019-09-04 DIAGNOSIS — Z1159 Encounter for screening for other viral diseases: Secondary | ICD-10-CM | POA: Diagnosis not present

## 2019-09-08 DIAGNOSIS — Z1159 Encounter for screening for other viral diseases: Secondary | ICD-10-CM | POA: Diagnosis not present

## 2019-09-09 DIAGNOSIS — E119 Type 2 diabetes mellitus without complications: Secondary | ICD-10-CM | POA: Diagnosis not present

## 2019-09-09 DIAGNOSIS — L89312 Pressure ulcer of right buttock, stage 2: Secondary | ICD-10-CM | POA: Diagnosis not present

## 2019-09-09 DIAGNOSIS — Z794 Long term (current) use of insulin: Secondary | ICD-10-CM | POA: Diagnosis not present

## 2019-09-09 DIAGNOSIS — E039 Hypothyroidism, unspecified: Secondary | ICD-10-CM | POA: Diagnosis not present

## 2019-09-09 DIAGNOSIS — R21 Rash and other nonspecific skin eruption: Secondary | ICD-10-CM | POA: Diagnosis not present

## 2019-09-09 DIAGNOSIS — L89322 Pressure ulcer of left buttock, stage 2: Secondary | ICD-10-CM | POA: Diagnosis not present

## 2019-09-09 DIAGNOSIS — Z89612 Acquired absence of left leg above knee: Secondary | ICD-10-CM | POA: Diagnosis not present

## 2019-09-11 DIAGNOSIS — Z1159 Encounter for screening for other viral diseases: Secondary | ICD-10-CM | POA: Diagnosis not present

## 2019-09-15 DIAGNOSIS — Z1159 Encounter for screening for other viral diseases: Secondary | ICD-10-CM | POA: Diagnosis not present

## 2019-09-17 DIAGNOSIS — Z794 Long term (current) use of insulin: Secondary | ICD-10-CM | POA: Diagnosis not present

## 2019-09-17 DIAGNOSIS — E039 Hypothyroidism, unspecified: Secondary | ICD-10-CM | POA: Diagnosis not present

## 2019-09-17 DIAGNOSIS — L89322 Pressure ulcer of left buttock, stage 2: Secondary | ICD-10-CM | POA: Diagnosis not present

## 2019-09-17 DIAGNOSIS — Z89612 Acquired absence of left leg above knee: Secondary | ICD-10-CM | POA: Diagnosis not present

## 2019-09-17 DIAGNOSIS — R21 Rash and other nonspecific skin eruption: Secondary | ICD-10-CM | POA: Diagnosis not present

## 2019-09-17 DIAGNOSIS — L89312 Pressure ulcer of right buttock, stage 2: Secondary | ICD-10-CM | POA: Diagnosis not present

## 2019-09-17 DIAGNOSIS — E119 Type 2 diabetes mellitus without complications: Secondary | ICD-10-CM | POA: Diagnosis not present

## 2019-09-18 DIAGNOSIS — Z1159 Encounter for screening for other viral diseases: Secondary | ICD-10-CM | POA: Diagnosis not present

## 2019-09-19 DIAGNOSIS — R509 Fever, unspecified: Secondary | ICD-10-CM | POA: Diagnosis not present

## 2019-09-19 DIAGNOSIS — R05 Cough: Secondary | ICD-10-CM | POA: Diagnosis not present

## 2019-09-22 DIAGNOSIS — R05 Cough: Secondary | ICD-10-CM | POA: Diagnosis not present

## 2019-09-22 DIAGNOSIS — Z1159 Encounter for screening for other viral diseases: Secondary | ICD-10-CM | POA: Diagnosis not present

## 2019-09-22 DIAGNOSIS — I10 Essential (primary) hypertension: Secondary | ICD-10-CM | POA: Diagnosis not present

## 2019-09-22 DIAGNOSIS — E1159 Type 2 diabetes mellitus with other circulatory complications: Secondary | ICD-10-CM | POA: Diagnosis not present

## 2019-09-25 DIAGNOSIS — Z1159 Encounter for screening for other viral diseases: Secondary | ICD-10-CM | POA: Diagnosis not present

## 2019-09-25 DIAGNOSIS — L89322 Pressure ulcer of left buttock, stage 2: Secondary | ICD-10-CM | POA: Diagnosis not present

## 2019-09-25 DIAGNOSIS — Z794 Long term (current) use of insulin: Secondary | ICD-10-CM | POA: Diagnosis not present

## 2019-09-25 DIAGNOSIS — L89312 Pressure ulcer of right buttock, stage 2: Secondary | ICD-10-CM | POA: Diagnosis not present

## 2019-09-25 DIAGNOSIS — R21 Rash and other nonspecific skin eruption: Secondary | ICD-10-CM | POA: Diagnosis not present

## 2019-09-25 DIAGNOSIS — E039 Hypothyroidism, unspecified: Secondary | ICD-10-CM | POA: Diagnosis not present

## 2019-09-25 DIAGNOSIS — E119 Type 2 diabetes mellitus without complications: Secondary | ICD-10-CM | POA: Diagnosis not present

## 2019-09-25 DIAGNOSIS — Z89612 Acquired absence of left leg above knee: Secondary | ICD-10-CM | POA: Diagnosis not present

## 2019-09-29 DIAGNOSIS — Z1159 Encounter for screening for other viral diseases: Secondary | ICD-10-CM | POA: Diagnosis not present

## 2019-10-02 DIAGNOSIS — Z1159 Encounter for screening for other viral diseases: Secondary | ICD-10-CM | POA: Diagnosis not present

## 2019-10-06 DIAGNOSIS — Z794 Long term (current) use of insulin: Secondary | ICD-10-CM | POA: Diagnosis not present

## 2019-10-06 DIAGNOSIS — E1159 Type 2 diabetes mellitus with other circulatory complications: Secondary | ICD-10-CM | POA: Diagnosis not present

## 2019-10-06 DIAGNOSIS — Z1159 Encounter for screening for other viral diseases: Secondary | ICD-10-CM | POA: Diagnosis not present

## 2019-10-06 DIAGNOSIS — I10 Essential (primary) hypertension: Secondary | ICD-10-CM | POA: Diagnosis not present

## 2019-10-09 DIAGNOSIS — Z1159 Encounter for screening for other viral diseases: Secondary | ICD-10-CM | POA: Diagnosis not present

## 2019-10-13 DIAGNOSIS — Z1159 Encounter for screening for other viral diseases: Secondary | ICD-10-CM | POA: Diagnosis not present

## 2019-10-16 DIAGNOSIS — Z1159 Encounter for screening for other viral diseases: Secondary | ICD-10-CM | POA: Diagnosis not present

## 2019-10-20 DIAGNOSIS — Z1159 Encounter for screening for other viral diseases: Secondary | ICD-10-CM | POA: Diagnosis not present

## 2019-10-21 DIAGNOSIS — Z79899 Other long term (current) drug therapy: Secondary | ICD-10-CM | POA: Diagnosis not present

## 2019-10-21 DIAGNOSIS — E1159 Type 2 diabetes mellitus with other circulatory complications: Secondary | ICD-10-CM | POA: Diagnosis not present

## 2019-10-21 DIAGNOSIS — Z794 Long term (current) use of insulin: Secondary | ICD-10-CM | POA: Diagnosis not present

## 2019-10-23 DIAGNOSIS — Z1159 Encounter for screening for other viral diseases: Secondary | ICD-10-CM | POA: Diagnosis not present

## 2019-10-27 DIAGNOSIS — E538 Deficiency of other specified B group vitamins: Secondary | ICD-10-CM | POA: Diagnosis not present

## 2019-10-27 DIAGNOSIS — I10 Essential (primary) hypertension: Secondary | ICD-10-CM | POA: Diagnosis not present

## 2019-10-27 DIAGNOSIS — Z1159 Encounter for screening for other viral diseases: Secondary | ICD-10-CM | POA: Diagnosis not present

## 2019-10-27 DIAGNOSIS — E1159 Type 2 diabetes mellitus with other circulatory complications: Secondary | ICD-10-CM | POA: Diagnosis not present

## 2019-10-30 DIAGNOSIS — Z1159 Encounter for screening for other viral diseases: Secondary | ICD-10-CM | POA: Diagnosis not present

## 2019-11-03 DIAGNOSIS — Z1159 Encounter for screening for other viral diseases: Secondary | ICD-10-CM | POA: Diagnosis not present

## 2019-11-06 DIAGNOSIS — Z1159 Encounter for screening for other viral diseases: Secondary | ICD-10-CM | POA: Diagnosis not present

## 2019-11-10 DIAGNOSIS — L89312 Pressure ulcer of right buttock, stage 2: Secondary | ICD-10-CM | POA: Diagnosis not present

## 2019-11-10 DIAGNOSIS — L89322 Pressure ulcer of left buttock, stage 2: Secondary | ICD-10-CM | POA: Diagnosis not present

## 2019-11-10 DIAGNOSIS — Z1159 Encounter for screening for other viral diseases: Secondary | ICD-10-CM | POA: Diagnosis not present

## 2019-11-10 DIAGNOSIS — R21 Rash and other nonspecific skin eruption: Secondary | ICD-10-CM | POA: Diagnosis not present

## 2019-11-13 DIAGNOSIS — Z1159 Encounter for screening for other viral diseases: Secondary | ICD-10-CM | POA: Diagnosis not present

## 2019-11-17 DIAGNOSIS — Z1159 Encounter for screening for other viral diseases: Secondary | ICD-10-CM | POA: Diagnosis not present

## 2019-11-20 DIAGNOSIS — Z1159 Encounter for screening for other viral diseases: Secondary | ICD-10-CM | POA: Diagnosis not present

## 2019-11-24 DIAGNOSIS — Z1159 Encounter for screening for other viral diseases: Secondary | ICD-10-CM | POA: Diagnosis not present

## 2019-11-27 DIAGNOSIS — Z1159 Encounter for screening for other viral diseases: Secondary | ICD-10-CM | POA: Diagnosis not present

## 2019-12-01 DIAGNOSIS — E1159 Type 2 diabetes mellitus with other circulatory complications: Secondary | ICD-10-CM | POA: Diagnosis not present

## 2019-12-01 DIAGNOSIS — Z1159 Encounter for screening for other viral diseases: Secondary | ICD-10-CM | POA: Diagnosis not present

## 2019-12-01 DIAGNOSIS — I10 Essential (primary) hypertension: Secondary | ICD-10-CM | POA: Diagnosis not present

## 2019-12-04 DIAGNOSIS — Z1159 Encounter for screening for other viral diseases: Secondary | ICD-10-CM | POA: Diagnosis not present

## 2019-12-08 DIAGNOSIS — L603 Nail dystrophy: Secondary | ICD-10-CM | POA: Diagnosis not present

## 2019-12-08 DIAGNOSIS — Z1159 Encounter for screening for other viral diseases: Secondary | ICD-10-CM | POA: Diagnosis not present

## 2019-12-08 DIAGNOSIS — Q845 Enlarged and hypertrophic nails: Secondary | ICD-10-CM | POA: Diagnosis not present

## 2019-12-11 DIAGNOSIS — Z1159 Encounter for screening for other viral diseases: Secondary | ICD-10-CM | POA: Diagnosis not present

## 2019-12-12 DIAGNOSIS — Z1159 Encounter for screening for other viral diseases: Secondary | ICD-10-CM | POA: Diagnosis not present

## 2019-12-15 DIAGNOSIS — Z1159 Encounter for screening for other viral diseases: Secondary | ICD-10-CM | POA: Diagnosis not present

## 2019-12-18 DIAGNOSIS — Z1159 Encounter for screening for other viral diseases: Secondary | ICD-10-CM | POA: Diagnosis not present

## 2019-12-22 DIAGNOSIS — Z1159 Encounter for screening for other viral diseases: Secondary | ICD-10-CM | POA: Diagnosis not present

## 2019-12-25 DIAGNOSIS — Z1159 Encounter for screening for other viral diseases: Secondary | ICD-10-CM | POA: Diagnosis not present

## 2019-12-29 DIAGNOSIS — Z1159 Encounter for screening for other viral diseases: Secondary | ICD-10-CM | POA: Diagnosis not present

## 2020-01-01 DIAGNOSIS — Z1159 Encounter for screening for other viral diseases: Secondary | ICD-10-CM | POA: Diagnosis not present

## 2020-01-05 DIAGNOSIS — Z1159 Encounter for screening for other viral diseases: Secondary | ICD-10-CM | POA: Diagnosis not present

## 2020-01-15 DIAGNOSIS — Z1159 Encounter for screening for other viral diseases: Secondary | ICD-10-CM | POA: Diagnosis not present

## 2020-01-19 DIAGNOSIS — Z1159 Encounter for screening for other viral diseases: Secondary | ICD-10-CM | POA: Diagnosis not present

## 2020-01-22 DIAGNOSIS — Z1159 Encounter for screening for other viral diseases: Secondary | ICD-10-CM | POA: Diagnosis not present

## 2020-01-24 ENCOUNTER — Inpatient Hospital Stay (HOSPITAL_COMMUNITY)
Admission: EM | Admit: 2020-01-24 | Discharge: 2020-01-24 | DRG: 605 | Disposition: A | Discharge status: Skilled Nursing Facility | Payer: Medicare Other | Source: Skilled Nursing Facility | Attending: Emergency Medicine | Admitting: Emergency Medicine

## 2020-01-24 ENCOUNTER — Other Ambulatory Visit: Payer: Self-pay

## 2020-01-24 ENCOUNTER — Encounter (HOSPITAL_COMMUNITY): Payer: Self-pay | Admitting: Emergency Medicine

## 2020-01-24 ENCOUNTER — Emergency Department (HOSPITAL_COMMUNITY): Payer: Medicare Other

## 2020-01-24 DIAGNOSIS — R238 Other skin changes: Secondary | ICD-10-CM | POA: Diagnosis not present

## 2020-01-24 DIAGNOSIS — D696 Thrombocytopenia, unspecified: Secondary | ICD-10-CM

## 2020-01-24 DIAGNOSIS — Z7984 Long term (current) use of oral hypoglycemic drugs: Secondary | ICD-10-CM | POA: Diagnosis not present

## 2020-01-24 DIAGNOSIS — E119 Type 2 diabetes mellitus without complications: Secondary | ICD-10-CM | POA: Diagnosis not present

## 2020-01-24 DIAGNOSIS — Z89612 Acquired absence of left leg above knee: Secondary | ICD-10-CM

## 2020-01-24 DIAGNOSIS — Z743 Need for continuous supervision: Secondary | ICD-10-CM | POA: Diagnosis not present

## 2020-01-24 DIAGNOSIS — E079 Disorder of thyroid, unspecified: Secondary | ICD-10-CM | POA: Diagnosis not present

## 2020-01-24 DIAGNOSIS — Z79899 Other long term (current) drug therapy: Secondary | ICD-10-CM | POA: Diagnosis not present

## 2020-01-24 DIAGNOSIS — R233 Spontaneous ecchymoses: Secondary | ICD-10-CM

## 2020-01-24 DIAGNOSIS — F0390 Unspecified dementia without behavioral disturbance: Secondary | ICD-10-CM | POA: Diagnosis present

## 2020-01-24 DIAGNOSIS — S9001XA Contusion of right ankle, initial encounter: Secondary | ICD-10-CM | POA: Diagnosis not present

## 2020-01-24 DIAGNOSIS — R404 Transient alteration of awareness: Secondary | ICD-10-CM | POA: Diagnosis not present

## 2020-01-24 DIAGNOSIS — R600 Localized edema: Secondary | ICD-10-CM | POA: Diagnosis not present

## 2020-01-24 DIAGNOSIS — F32A Depression, unspecified: Secondary | ICD-10-CM | POA: Diagnosis not present

## 2020-01-24 DIAGNOSIS — Z79890 Hormone replacement therapy (postmenopausal): Secondary | ICD-10-CM

## 2020-01-24 DIAGNOSIS — R0789 Other chest pain: Secondary | ICD-10-CM | POA: Diagnosis not present

## 2020-01-24 DIAGNOSIS — Z794 Long term (current) use of insulin: Secondary | ICD-10-CM | POA: Diagnosis not present

## 2020-01-24 DIAGNOSIS — S90121A Contusion of right lesser toe(s) without damage to nail, initial encounter: Secondary | ICD-10-CM | POA: Diagnosis not present

## 2020-01-24 DIAGNOSIS — M2550 Pain in unspecified joint: Secondary | ICD-10-CM | POA: Diagnosis not present

## 2020-01-24 DIAGNOSIS — R109 Unspecified abdominal pain: Secondary | ICD-10-CM | POA: Diagnosis not present

## 2020-01-24 DIAGNOSIS — Z7401 Bed confinement status: Secondary | ICD-10-CM | POA: Diagnosis not present

## 2020-01-24 DIAGNOSIS — S9031XA Contusion of right foot, initial encounter: Secondary | ICD-10-CM | POA: Diagnosis not present

## 2020-01-24 DIAGNOSIS — R079 Chest pain, unspecified: Secondary | ICD-10-CM | POA: Diagnosis not present

## 2020-01-24 DIAGNOSIS — R6889 Other general symptoms and signs: Secondary | ICD-10-CM | POA: Diagnosis not present

## 2020-01-24 HISTORY — DX: Unspecified dementia, unspecified severity, without behavioral disturbance, psychotic disturbance, mood disturbance, and anxiety: F03.90

## 2020-01-24 HISTORY — DX: Type 2 diabetes mellitus without complications: E11.9

## 2020-01-24 HISTORY — DX: Disorder of thyroid, unspecified: E07.9

## 2020-01-24 LAB — DIC (DISSEMINATED INTRAVASCULAR COAGULATION)PANEL
D-Dimer, Quant: 1.18 ug/mL-FEU — ABNORMAL HIGH (ref 0.00–0.50)
Fibrinogen: 530 mg/dL — ABNORMAL HIGH (ref 210–475)
INR: 1 (ref 0.8–1.2)
Platelets: 252 10*3/uL (ref 150–400)
Prothrombin Time: 13 seconds (ref 11.4–15.2)
aPTT: 30 seconds (ref 24–36)

## 2020-01-24 LAB — COMPREHENSIVE METABOLIC PANEL
ALT: 14 U/L (ref 0–44)
AST: 18 U/L (ref 15–41)
Albumin: 3.8 g/dL (ref 3.5–5.0)
Alkaline Phosphatase: 55 U/L (ref 38–126)
Anion gap: 7 (ref 5–15)
BUN: 16 mg/dL (ref 8–23)
CO2: 28 mmol/L (ref 22–32)
Calcium: 9.8 mg/dL (ref 8.9–10.3)
Chloride: 96 mmol/L — ABNORMAL LOW (ref 98–111)
Creatinine, Ser: 0.71 mg/dL (ref 0.44–1.00)
GFR, Estimated: >60 mL/min (ref >60)
Glucose, Bld: 80 mg/dL (ref 70–99)
Potassium: 4.2 mmol/L (ref 3.5–5.1)
Sodium: 131 mmol/L — ABNORMAL LOW (ref 135–145)
Total Bilirubin: 0.7 mg/dL (ref 0.3–1.2)
Total Protein: 6.5 g/dL (ref 6.5–8.1)

## 2020-01-24 LAB — CBC WITH DIFFERENTIAL/PLATELET
Abs Immature Granulocytes: 0.05 10*3/uL (ref 0.00–0.07)
Basophils Absolute: 0 10*3/uL (ref 0.0–0.1)
Basophils Relative: 0 %
Eosinophils Absolute: 0.1 10*3/uL (ref 0.0–0.5)
Eosinophils Relative: 2 %
HCT: 34.8 % — ABNORMAL LOW (ref 36.0–46.0)
Hemoglobin: 11.8 g/dL — ABNORMAL LOW (ref 12.0–15.0)
Immature Granulocytes: 2 %
Lymphocytes Relative: 49 %
Lymphs Abs: 1.6 10*3/uL (ref 0.7–4.0)
MCH: 29 pg (ref 26.0–34.0)
MCHC: 33.9 g/dL (ref 30.0–36.0)
MCV: 85.5 fL (ref 80.0–100.0)
Monocytes Absolute: 0.1 10*3/uL (ref 0.1–1.0)
Monocytes Relative: 4 %
Neutro Abs: 1.4 10*3/uL — ABNORMAL LOW (ref 1.7–7.7)
Neutrophils Relative %: 43 %
Platelets: 5 10*3/uL — CL (ref 150–400)
RBC: 4.07 MIL/uL (ref 3.87–5.11)
RDW: 12.2 % (ref 11.5–15.5)
WBC: 3.2 10*3/uL — ABNORMAL LOW (ref 4.0–10.5)
nRBC: 0 % (ref 0.0–0.2)

## 2020-01-24 LAB — CBG MONITORING, ED: Glucose-Capillary: 80 mg/dL (ref 70–99)

## 2020-01-24 MED ORDER — SODIUM CHLORIDE 0.9 % IV SOLN: 10.0000 mL/h | Freq: Once | INTRAVENOUS | Status: DC

## 2020-01-24 NOTE — ED Notes (Signed)
Patient transported to X-ray 

## 2020-01-24 NOTE — ED Notes (Signed)
[facility] called wanting an update on pt. Informed that we are waiting on results of repeated PLT that is in process as if still abnormal than will be admitted if normal than will be discharged back to facility.

## 2020-01-24 NOTE — Discharge Instructions (Addendum)
Get help right away if: You have severe pain. Your foot or toes become numb. Your foot or toes become pale or cold. You cannot move your foot or ankle. Your foot is warm to the touch. 

## 2020-01-24 NOTE — ED Provider Notes (Signed)
[facility]-EMERGENCY DEPT Provider Note   CSN: [id number] Arrival date & time: 01/24/20  1104     History Chief Complaint  Patient presents with  . Foot Injury    Alexis Becker is a 69 y.o. female who presents from memory care tablets with.  She has a past medical history of diabetes, dementia and is status post left AKA.  Apparently staff noticed redness, bruising and swelling of the right foot and toes.  He has not had any witnessed falls or injuries so they sent her for further evaluation.  The patient does complain of pain in the right foot and ankle.  HPI     Past Medical History:  Diagnosis Date  . Dementia (HCC)   . Diabetes mellitus without complication (HCC)   . Thyroid disease     There are no problems to display for this patient.   History reviewed. No pertinent surgical history.   OB History   No obstetric history on file.     No family history on file.     Home Medications Prior to Admission medications   Not on File    Allergies    Patient has no known allergies.  Review of Systems   Review of Systems Unable to review systems due to dementia  Physical Exam Updated Vital Signs BP (!) 152/134 (BP Location: Left Arm)   Pulse 70   Temp 98.3 F (36.8 C) (Oral)   Resp 19   SpO2 98%   Physical Exam Vitals and nursing note reviewed.  Constitutional:      General: She is not in acute distress.    Appearance: She is well-developed and well-nourished. She is not diaphoretic.  HENT:     Head: Normocephalic and atraumatic.  Eyes:     General: No scleral icterus.    Conjunctiva/sclera: Conjunctivae normal.  Cardiovascular:     Rate and Rhythm: Normal rate and regular rhythm.     Heart sounds: Normal heart sounds. No murmur heard. No friction rub. No gallop.   Pulmonary:     Effort: Pulmonary effort is normal. No respiratory distress.     Breath sounds: Normal breath sounds.  Abdominal:     General: Bowel sounds are  normal. There is no distension.     Palpations: Abdomen is soft. There is no mass.     Tenderness: There is no abdominal tenderness. There is no guarding.  Musculoskeletal:     Cervical back: Normal range of motion.  Skin:    General: Skin is warm and dry.  Neurological:     Mental Status: She is alert and oriented to person, place, and time.  Psychiatric:        Behavior: Behavior normal.     ED Results / Procedures / Treatments   Labs (all labs ordered are listed, but only abnormal results are displayed) Labs Reviewed - No data to display  EKG None  Radiology No results found.  Procedures Procedures (including critical care time)  Medications Ordered in ED Medications - No data to display  ED Course  I have reviewed the triage vital signs and the nursing notes.  Pertinent labs & imaging results that were available during my care of the patient were reviewed by me and considered in my medical decision making (see chart for details).  Clinical Course as of 01/24/20 1722  Sat Jan 24, 2020  1512 Abs Immature Granulocytes: 0.05 [AH]  1646 Platelets: 252 [AH]    Clinical Course User  Index [AH] Arthor Captain, PA-C   MDM Rules/Calculators/A&P                         Patient here with a history of dementia.  Bruising to the right foot of unknown origin.  No evidence of cellulitis, I ordered and reviewed a right foot and ankle x-ray which show no evidence of fracture or dislocation.  She has no venous dilation or evidence of DVT.  Initial CBC with differential showed a platelet count of 5 however after DIC panel was ordered repeat platelet count showed that platelets were within normal limits.  Thrombocytopenia appears to be lab error.  I had previously at call for admission and consult with Dr. Dr. Darnelle Catalan however patient no longer needs admission will be discharged back to her facility with a diagnosis of foot bruise.  Patient appears otherwise stable and appropriate for  discharge at this time Final Clinical Impression(s) / ED Diagnoses Final diagnoses:  None    Rx / DC Orders ED Discharge Orders    None       Arthor Captain, PA-C 01/24/20 1723    Mancel Bale, MD 01/24/20 2010

## 2020-01-24 NOTE — ED Provider Notes (Signed)
  Face-to-face evaluation   History: Patient presents for evaluation of atraumatic bruising of the toes, fourth and fifth, right.  She is unable to give history.  Patient cooperates with examination however cannot give history.  Physical exam: Alert obese female who is calm and comfortable.  No respiratory distress.  Abdomen soft nontender.  Extremities normal range of motion with exception of the toes 4 and 5 on the right are tender to palpation.  She has a left leg AKA.  There is bruising of the webspace of right foot, toes 4 and 5.  There is no significant associated cellulitis type changes, drainage or active bleeding.  Skin, no other clear rash.  Medical screening examination/treatment/procedure(s) were conducted as a shared visit with non-physician practitioner(s) and myself.  I personally evaluated the patient during the encounter    Mancel Bale, MD 01/24/20 2010

## 2020-01-24 NOTE — ED Notes (Signed)
Pt stuck multiple times without success for IV access and blood work.

## 2020-01-24 NOTE — ED Triage Notes (Signed)
Per [facility] pt [facility] for bruising on right 4th and 5th toes and redness on right foot and ankle that started last night. Staff denied any falls or injuries they were aware of to EMS.

## 2020-01-24 NOTE — Progress Notes (Signed)
[facility]  Telephone:([phone]) [phone] [phone]     ID: Alexis Becker DOB: October 17, 1950  MR#: [id number]  HPD#:[id number]  Patient Care Team: Patient, No Pcp Per as PCP - General (General Practice) Lowella Dell, MD OTHER MD:  . HISTORY OF CURRENT ILLNESS: Alexis Becker was sent to the ED from her SNF because of apparent trauma to her Right foot. In the ED labs were obtained showing a WBC 3.2, ANC 1.4, Hb 11.8 but platelets 5K. We were consulted for evaluation of severe thrombocytopenia  The patient's subsequent history is as detailed below.  INTERVAL HISTORY: I met with the patient in her ED room, discussed case with lab staff and with ED/PA.   REVIEW OF SYSTEMS: Patient tells me she is "fine," denies pain, not aware of falls; denies h/a, visual changes, cough, SOB or change in bowel or bladder habits. Note patient is demented and ROS is unreliable. However patient was able to tell me that she had diabetes and was able to name her sister locally.  PAST MEDICAL HISTORY: Past Medical History:  Diagnosis Date   Dementia (HCC)    Diabetes mellitus without complication (HCC)    Thyroid disease     PAST SURGICAL HISTORY: History reviewed. No pertinent surgical history.  Per patient, she [place] cardiac [place] in [place]  FAMILY HISTORY No family history on file.  States her parents are living. States she has one brother and one sister. Not aware of blood problems in family.  GYNECOLOGIC HISTORY:  No LMP recorded. Menarche: 69 years old GX P 0 LMPdoes not recall Hysterectomy? 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 She now lives "in [place]." She moved to this area to be near her sister Alexis Becker.    ADVANCED DIRECTIVES: Patient tells me she does not have a HCPOA      No Known Allergies  Current Facility-Administered Medications  Medication  Dose Route Frequency Provider Last Rate Last Admin   0.9 %  sodium chloride infusion  10 mL/hr Intravenous Once Arthor Captain, PA-C       Current Outpatient Medications  Medication Sig Dispense Refill   ARIPiprazole (ABILIFY) 2 MG tablet Take 2 mg by mouth daily.     calcium carbonate (OS-CAL) 600 MG TABS tablet Take 600 mg by mouth 2 (two) times daily.     clonazePAM (KLONOPIN) 0.5 MG tablet Take 0.25 mg by mouth 3 (three) times daily.     Docusate Sodium 100 MG capsule Take 100 mg by mouth 2 (two) times daily.     escitalopram (LEXAPRO) 20 MG tablet Take 20 mg by mouth daily.     guaiFENesin (ROBITUSSIN) 100 MG/5ML liquid Take 200 mg by mouth every 4 (four) hours as needed for cough or congestion.     LANTUS SOLOSTAR 100 UNIT/ML Solostar Pen Inject 14 Units into the skin every morning. Hold for sugar < 110; prime with 2 units prior to sue - use within 28 days once opened and use with safety pen needles.     levothyroxine (SYNTHROID) 50 MCG tablet Take 50 mcg by mouth daily.     lisinopril (ZESTRIL) 2.5 MG tablet Take 2.5 mg by mouth daily.     loperamide (IMODIUM A-D) 2 MG tablet Take 2 mg by mouth 3 (three) times daily as needed for diarrhea or loose stools.     melatonin 3 MG TABS tablet Take 3 mg by  mouth at bedtime.     Menthol, Topical Analgesic, (PAIN RELIEVING PATCH ULTRA ST EX) Apply 1 patch topically daily as needed (pain; apply to left stump).     metFORMIN (GLUCOPHAGE) 1000 MG tablet Take 1,000 mg by mouth 2 (two) times daily.     Multiple Vitamins-Minerals (HEALTHY EYES PO) Take 1 capsule by mouth daily.     NYAMYC powder Apply 1 application topically 2 (two) times daily. Apply to groin affected are until healed.     pioglitazone (ACTOS) 15 MG tablet Take 15 mg by mouth daily.     polyethylene glycol powder (GLYCOLAX/MIRALAX) 17 GM/SCOOP powder Take 17 g by mouth as needed (constipation).     simvastatin (ZOCOR) 20 MG tablet Take 20 mg by mouth every evening.      sodium chloride 1 g tablet Take 1 g by mouth 2 (two) times daily.     thiamine 100 MG tablet Take 100 mg by mouth daily.     vitamin B-12 (CYANOCOBALAMIN) 500 MCG tablet Take 500 mcg by mouth daily.     zinc oxide 20 % ointment Apply 1 application topically 3 (three) times daily as needed (apply to buttocks for redness).      OBJECTIVE: White woman examined in bed  Vitals:   01/24/20 1522 01/24/20 1651  BP: (!) 137/56 (!) 106/46  Pulse: 73 79  Resp: 18 18  Temp:    SpO2: 100% 98%     There is no height or weight on file to calculate BMI.   Wt Readings from Last 3 Encounters:  No data found for Wt    Ocular: Sclerae unicteric, pupils round and equal Lungs no rales or rhonchi Heart regular rate and rhythm Abd soft, nontender, positive bowel sounds MSK bruised 5th Right toe, no evidence of cellulitis Neuro: non-focal, flat affect; not oriented x3 ("1989," "Maudie Flakes," "this is not a hospital") Breasts: deferred   LAB RESULTS:  CMP     Component Value Date/Time   NA 131 (L) 01/24/2020 1359   K 4.2 01/24/2020 1359   CL 96 (L) 01/24/2020 1359   CO2 28 01/24/2020 1359   GLUCOSE 80 01/24/2020 1359   BUN 16 01/24/2020 1359   CREATININE 0.71 01/24/2020 1359   CALCIUM 9.8 01/24/2020 1359   PROT 6.5 01/24/2020 1359   ALBUMIN 3.8 01/24/2020 1359   AST 18 01/24/2020 1359   ALT 14 01/24/2020 1359   ALKPHOS 55 01/24/2020 1359   BILITOT 0.7 01/24/2020 1359   GFRNONAA >60 01/24/2020 1359    No results found for: TOTALPROTELP, ALBUMINELP, A1GS, A2GS, BETS, BETA2SER, GAMS, MSPIKE, SPEI  No results found for: KPAFRELGTCHN, LAMBDASER, KAPLAMBRATIO  Lab Results  Component Value Date   WBC 3.2 (L) 01/24/2020   NEUTROABS 1.4 (L) 01/24/2020   HGB 11.8 (L) 01/24/2020   HCT 34.8 (L) 01/24/2020   MCV 85.5 01/24/2020   PLT 252 01/24/2020    '@LASTCHEMISTRY'$ @  No results found for: LABCA2  No components found for: LPFXTK240  Recent Labs  Lab 01/24/20 1610  INR 1.0     No results found for: LABCA2  No results found for: XBD532  No results found for: DJM426  No results found for: STM196  No results found for: CA2729  No components found for: HGQUANT  No results found for: CEA1 / No results found for: CEA1   No results found for: AFPTUMOR  No results found for: CHROMOGRNA  No results found for: PSA1  Admission on 01/24/2020  Component Date  Value Ref Range Status   WBC 01/24/2020 3.2* 4.0 - 10.5 K/uL Final   RBC 01/24/2020 4.07  3.87 - 5.11 MIL/uL Final   Hemoglobin 01/24/2020 11.8* 12.0 - 15.0 g/dL Final   HCT 01/24/2020 34.8* 36.0 - 46.0 % Final   MCV 01/24/2020 85.5  80.0 - 100.0 fL Final   MCH 01/24/2020 29.0  26.0 - 34.0 pg Final   MCHC 01/24/2020 33.9  30.0 - 36.0 g/dL Final   RDW 01/24/2020 12.2  11.5 - 15.5 % Final   Platelets 01/24/2020 5* 150 - 400 K/uL Final   Comment: SPECIMEN CHECKED FOR CLOTS THIS CRITICAL RESULT HAS VERIFIED AND BEEN CALLED TO HALL,C BY KENNEDY JACKSON ON 12 11 2021 AT 1450, AND HAS BEEN READ BACK. CRITICAL RESULT VERIFIED    nRBC 01/24/2020 0.0  0.0 - 0.2 % Final   Neutrophils Relative % 01/24/2020 43  % Final   Neutro Abs 01/24/2020 1.4* 1.7 - 7.7 K/uL Final   Lymphocytes Relative 01/24/2020 49  % Final   Lymphs Abs 01/24/2020 1.6  0.7 - 4.0 K/uL Final   Monocytes Relative 01/24/2020 4  % Final   Monocytes Absolute 01/24/2020 0.1  0.1 - 1.0 K/uL Final   Eosinophils Relative 01/24/2020 2  % Final   Eosinophils Absolute 01/24/2020 0.1  0.0 - 0.5 K/uL Final   Basophils Relative 01/24/2020 0  % Final   Basophils Absolute 01/24/2020 0.0  0.0 - 0.1 K/uL Final   Immature Granulocytes 01/24/2020 2  % Final   Abs Immature Granulocytes 01/24/2020 0.05  0.00 - 0.07 K/uL Final   Performed at [facility], [place] [address], [place], [place] [address]   Sodium 01/24/2020 131* 135 - 145 mmol/L Final   Potassium 01/24/2020 4.2  3.5 - 5.1 mmol/L Final   Chloride  01/24/2020 96* 98 - 111 mmol/L Final   CO2 01/24/2020 28  22 - 32 mmol/L Final   Glucose, Bld 01/24/2020 80  70 - 99 mg/dL Final   Glucose reference range applies only to samples taken after fasting for at least 8 hours.   BUN 01/24/2020 16  8 - 23 mg/dL Final   Creatinine, Ser 01/24/2020 0.71  0.44 - 1.00 mg/dL Final   Calcium 01/24/2020 9.8  8.9 - 10.3 mg/dL Final   Total Protein 01/24/2020 6.5  6.5 - 8.1 g/dL Final   Albumin 01/24/2020 3.8  3.5 - 5.0 g/dL Final   AST 01/24/2020 18  15 - 41 U/L Final   ALT 01/24/2020 14  0 - 44 U/L Final   Alkaline Phosphatase 01/24/2020 55  38 - 126 U/L Final   Total Bilirubin 01/24/2020 0.7  0.3 - 1.2 mg/dL Final   GFR, Estimated 01/24/2020 >60  >60 mL/min Final   Comment: (NOTE) Calculated using the CKD-EPI Creatinine Equation (2021)    Anion gap 01/24/2020 7  5 - 15 Final   Performed at [facility], [place] [address], [place],  [address]   Glucose-Capillary 01/24/2020 80  70 - 99 mg/dL Final   Glucose reference range applies only to samples taken after fasting for at least 8 hours.   Prothrombin Time 01/24/2020 13.0  11.4 - 15.2 seconds Final   INR 01/24/2020 1.0  0.8 - 1.2 Final   Comment: (NOTE) INR goal varies based on device and disease states.    aPTT 01/24/2020 30  24 - 36 seconds Final   Fibrinogen 01/24/2020 530* 210 - 475 mg/dL Final   D-Dimer, America Brown 01/24/2020  1.18* 0.00 - 0.50 ug/mL-FEU Final   Comment: (NOTE) At the manufacturer cut-off value of 0.5 g/mL FEU, this assay has a negative predictive value of 95-100%.This assay is intended for use in conjunction with a clinical pretest probability (PTP) assessment model to exclude pulmonary embolism (PE) and deep venous thrombosis (DVT) in outpatients suspected of PE or DVT. Results should be correlated with clinical presentation.    Platelets 01/24/2020 252  150 - 400 K/uL Final   Comment: REPEATED TO VERIFY DELTA CHECK NOTED VERIFIED  WITH PA A,HARRIS.    Smear Review 01/24/2020 SMEAR REVIEW COMPLETE   Final   Comment: NO SCHISTOCYTES SEEN Performed at [facility], [place] [address] [place], [place] [address]     (this displays the last labs from the last 3 days)  No results found for: TOTALPROTELP, ALBUMINELP, A1GS, A2GS, BETS, BETA2SER, GAMS, MSPIKE, SPEI (this displays SPEP labs)  No results found for: KPAFRELGTCHN, LAMBDASER, KAPLAMBRATIO (kappa/lambda light chains)  No results found for: HGBA, HGBA2QUANT, HGBFQUANT, HGBSQUAN (Hemoglobinopathy evaluation)   No results found for: LDH  No results found for: IRON, TIBC, IRONPCTSAT (Iron and TIBC)  No results found for: FERRITIN  Urinalysis No results found for: COLORURINE, APPEARANCEUR, LABSPEC, PHURINE, GLUCOSEU, HGBUR, BILIRUBINUR, KETONESUR, PROTEINUR, UROBILINOGEN, NITRITE, LEUKOCYTESUR   STUDIES: DG Ankle Complete Right  Result Date: 01/24/2020 CLINICAL DATA:  Bruising on fourth and fifth toes, redness on right foot and ankle, onset last night, no known injury EXAM: RIGHT ANKLE - COMPLETE 3+ VIEW COMPARISON:  None. FINDINGS: There is no evidence of acute fracture, dislocation, or joint effusion. Corticated, nonacute posttraumatic ossicle at the tip of the medial malleolus. There is no evidence of arthropathy or other focal bone abnormality. Diffuse soft tissue edema about the ankle. IMPRESSION: 1. No acute fracture or dislocation of the right ankle. 2. Diffuse soft tissue edema about the ankle. Electronically Signed   By: Eddie Candle M.D.   On: 01/24/2020 14:09   DG Foot Complete Right  Result Date: 01/24/2020 CLINICAL DATA:  69 year old female with bruising on the right fourth and fifth toes, redness on the right foot and ankle. EXAM: RIGHT FOOT COMPLETE - 3+ VIEW COMPARISON:  None. FINDINGS: There is no evidence of fracture or dislocation. No significant arthropathy. Mild plantar calcaneal enthesopathy. Soft tissues are  unremarkable. IMPRESSION: No acute fracture or malalignment. Electronically Signed   By: Ruthann Cancer MD   On: 01/24/2020 13:58    ASSESSMENT: 69 y.o. J[place] Ser woman referred for evaluation of severe thrombocytopenia.  REVIEW OF BLOOD FILM: (third blood draw) There are no schistocytes; RBCs are bland; no significant left shift in white series; no artefactual platelet clumps; platelets not decreased  PLAN: We were consulted re severe thrombocytopenia and requested a DIC panel. However the reading on that returned with a normal platelet count. A second, separate blood draw was requested, confirming normal counts, as does the blood film review  It appears the initial blood draw was done by fingerstick, which may explain the discrepancy.  Final Diagnosis: artefactual thrombocytopenia.  Disposition: patient is being discharged from the ED   Chauncey Cruel, MD   01/24/2020 5:04 PM Medical Oncology and Hematology [facility] [address] [place], [place] [address] Tel. [phone]    Fax. [phone]

## 2020-01-24 NOTE — ED Notes (Signed)
Date and time results received: 01/24/20 2:51 PM  (use smartphrase ".now" to insert current time)  Test: PLT  Critical Value: 5  Name of Provider Notified: Abigail PA   Orders Received? Or Actions Taken?:

## 2020-01-24 NOTE — ED Notes (Signed)
[facility] notified of transport needed to [facility]. Was told it would be a couple hours.

## 2020-01-26 DIAGNOSIS — Z1159 Encounter for screening for other viral diseases: Secondary | ICD-10-CM | POA: Diagnosis not present

## 2020-01-26 DIAGNOSIS — S9031XA Contusion of right foot, initial encounter: Secondary | ICD-10-CM | POA: Diagnosis not present

## 2020-01-26 DIAGNOSIS — R238 Other skin changes: Secondary | ICD-10-CM | POA: Diagnosis not present

## 2020-01-26 DIAGNOSIS — I10 Essential (primary) hypertension: Secondary | ICD-10-CM | POA: Diagnosis not present

## 2020-01-26 DIAGNOSIS — E1159 Type 2 diabetes mellitus with other circulatory complications: Secondary | ICD-10-CM | POA: Diagnosis not present

## 2020-01-29 DIAGNOSIS — Z1159 Encounter for screening for other viral diseases: Secondary | ICD-10-CM | POA: Diagnosis not present

## 2020-02-02 DIAGNOSIS — Z1159 Encounter for screening for other viral diseases: Secondary | ICD-10-CM | POA: Diagnosis not present

## 2020-02-05 DIAGNOSIS — Z1159 Encounter for screening for other viral diseases: Secondary | ICD-10-CM | POA: Diagnosis not present

## 2020-02-09 DIAGNOSIS — Z1159 Encounter for screening for other viral diseases: Secondary | ICD-10-CM | POA: Diagnosis not present

## 2020-02-12 DIAGNOSIS — Z1159 Encounter for screening for other viral diseases: Secondary | ICD-10-CM | POA: Diagnosis not present

## 2020-02-16 DIAGNOSIS — Z1159 Encounter for screening for other viral diseases: Secondary | ICD-10-CM | POA: Diagnosis not present

## 2020-02-19 DIAGNOSIS — Z1159 Encounter for screening for other viral diseases: Secondary | ICD-10-CM | POA: Diagnosis not present

## 2020-02-22 DIAGNOSIS — Z1159 Encounter for screening for other viral diseases: Secondary | ICD-10-CM | POA: Diagnosis not present

## 2020-02-23 DIAGNOSIS — Z1159 Encounter for screening for other viral diseases: Secondary | ICD-10-CM | POA: Diagnosis not present

## 2020-02-26 DIAGNOSIS — Z1159 Encounter for screening for other viral diseases: Secondary | ICD-10-CM | POA: Diagnosis not present

## 2020-03-01 DIAGNOSIS — Z1159 Encounter for screening for other viral diseases: Secondary | ICD-10-CM | POA: Diagnosis not present

## 2020-03-04 DIAGNOSIS — U071 COVID-19: Secondary | ICD-10-CM | POA: Diagnosis not present

## 2020-03-04 DIAGNOSIS — J9811 Atelectasis: Secondary | ICD-10-CM | POA: Diagnosis not present

## 2020-03-04 DIAGNOSIS — Z1159 Encounter for screening for other viral diseases: Secondary | ICD-10-CM | POA: Diagnosis not present

## 2020-03-08 DIAGNOSIS — U071 COVID-19: Secondary | ICD-10-CM | POA: Diagnosis not present

## 2020-03-08 DIAGNOSIS — I10 Essential (primary) hypertension: Secondary | ICD-10-CM | POA: Diagnosis not present

## 2020-03-08 DIAGNOSIS — Z79899 Other long term (current) drug therapy: Secondary | ICD-10-CM | POA: Diagnosis not present

## 2020-03-08 DIAGNOSIS — Z1159 Encounter for screening for other viral diseases: Secondary | ICD-10-CM | POA: Diagnosis not present

## 2020-03-11 DIAGNOSIS — Z1159 Encounter for screening for other viral diseases: Secondary | ICD-10-CM | POA: Diagnosis not present

## 2020-03-15 DIAGNOSIS — Z1159 Encounter for screening for other viral diseases: Secondary | ICD-10-CM | POA: Diagnosis not present

## 2020-03-17 ENCOUNTER — Emergency Department (HOSPITAL_COMMUNITY)
Admission: EM | Admit: 2020-03-17 | Discharge: 2020-03-17 | Disposition: A | Discharge status: Home / Self Care | Payer: Medicare Other | Attending: Emergency Medicine | Admitting: Emergency Medicine

## 2020-03-17 ENCOUNTER — Other Ambulatory Visit: Payer: Self-pay

## 2020-03-17 ENCOUNTER — Emergency Department (HOSPITAL_COMMUNITY): Payer: Medicare Other

## 2020-03-17 ENCOUNTER — Encounter (HOSPITAL_COMMUNITY): Payer: Self-pay

## 2020-03-17 DIAGNOSIS — S0081XA Abrasion of other part of head, initial encounter: Secondary | ICD-10-CM | POA: Insufficient documentation

## 2020-03-17 DIAGNOSIS — R220 Localized swelling, mass and lump, head: Secondary | ICD-10-CM | POA: Diagnosis not present

## 2020-03-17 DIAGNOSIS — S0033XA Contusion of nose, initial encounter: Secondary | ICD-10-CM | POA: Diagnosis not present

## 2020-03-17 DIAGNOSIS — W19XXXA Unspecified fall, initial encounter: Secondary | ICD-10-CM | POA: Insufficient documentation

## 2020-03-17 DIAGNOSIS — S0990XA Unspecified injury of head, initial encounter: Secondary | ICD-10-CM | POA: Diagnosis not present

## 2020-03-17 DIAGNOSIS — Y92129 Unspecified place in nursing home as the place of occurrence of the external cause: Secondary | ICD-10-CM | POA: Diagnosis not present

## 2020-03-17 DIAGNOSIS — R531 Weakness: Secondary | ICD-10-CM | POA: Diagnosis not present

## 2020-03-17 DIAGNOSIS — M2550 Pain in unspecified joint: Secondary | ICD-10-CM | POA: Diagnosis not present

## 2020-03-17 DIAGNOSIS — Z7984 Long term (current) use of oral hypoglycemic drugs: Secondary | ICD-10-CM | POA: Insufficient documentation

## 2020-03-17 DIAGNOSIS — E119 Type 2 diabetes mellitus without complications: Secondary | ICD-10-CM | POA: Diagnosis not present

## 2020-03-17 DIAGNOSIS — Z743 Need for continuous supervision: Secondary | ICD-10-CM | POA: Diagnosis not present

## 2020-03-17 DIAGNOSIS — Z7401 Bed confinement status: Secondary | ICD-10-CM | POA: Diagnosis not present

## 2020-03-17 DIAGNOSIS — R404 Transient alteration of awareness: Secondary | ICD-10-CM | POA: Diagnosis not present

## 2020-03-17 DIAGNOSIS — F0390 Unspecified dementia without behavioral disturbance: Secondary | ICD-10-CM | POA: Diagnosis not present

## 2020-03-17 DIAGNOSIS — R0902 Hypoxemia: Secondary | ICD-10-CM | POA: Diagnosis not present

## 2020-03-17 DIAGNOSIS — Z043 Encounter for examination and observation following other accident: Secondary | ICD-10-CM | POA: Diagnosis not present

## 2020-03-17 DIAGNOSIS — R6889 Other general symptoms and signs: Secondary | ICD-10-CM | POA: Diagnosis not present

## 2020-03-17 DIAGNOSIS — S0992XA Unspecified injury of nose, initial encounter: Secondary | ICD-10-CM | POA: Diagnosis present

## 2020-03-17 DIAGNOSIS — R5381 Other malaise: Secondary | ICD-10-CM | POA: Diagnosis not present

## 2020-03-17 DIAGNOSIS — R9431 Abnormal electrocardiogram [ECG] [EKG]: Secondary | ICD-10-CM | POA: Diagnosis not present

## 2020-03-17 LAB — COMPREHENSIVE METABOLIC PANEL
ALT: 15 U/L (ref 0–44)
AST: 19 U/L (ref 15–41)
Albumin: 4 g/dL (ref 3.5–5.0)
Alkaline Phosphatase: 65 U/L (ref 38–126)
Anion gap: 12 (ref 5–15)
BUN: 25 mg/dL — ABNORMAL HIGH (ref 8–23)
CO2: 24 mmol/L (ref 22–32)
Calcium: 10 mg/dL (ref 8.9–10.3)
Chloride: 103 mmol/L (ref 98–111)
Creatinine, Ser: 0.77 mg/dL (ref 0.44–1.00)
GFR, Estimated: >60 mL/min (ref >60)
Glucose, Bld: 120 mg/dL — ABNORMAL HIGH (ref 70–99)
Potassium: 4 mmol/L (ref 3.5–5.1)
Sodium: 139 mmol/L (ref 135–145)
Total Bilirubin: 0.5 mg/dL (ref 0.3–1.2)
Total Protein: 7.5 g/dL (ref 6.5–8.1)

## 2020-03-17 LAB — CBC WITH DIFFERENTIAL/PLATELET
Abs Immature Granulocytes: 0.06 10*3/uL (ref 0.00–0.07)
Basophils Absolute: 0 10*3/uL (ref 0.0–0.1)
Basophils Relative: 0 %
Eosinophils Absolute: 0.3 10*3/uL (ref 0.0–0.5)
Eosinophils Relative: 2 %
HCT: 40.8 % (ref 36.0–46.0)
Hemoglobin: 13.2 g/dL (ref 12.0–15.0)
Immature Granulocytes: 1 %
Lymphocytes Relative: 14 %
Lymphs Abs: 1.5 10*3/uL (ref 0.7–4.0)
MCH: 28.3 pg (ref 26.0–34.0)
MCHC: 32.4 g/dL (ref 30.0–36.0)
MCV: 87.4 fL (ref 80.0–100.0)
Monocytes Absolute: 0.6 10*3/uL (ref 0.1–1.0)
Monocytes Relative: 5 %
Neutro Abs: 8.5 10*3/uL — ABNORMAL HIGH (ref 1.7–7.7)
Neutrophils Relative %: 78 %
Platelets: 297 10*3/uL (ref 150–400)
RBC: 4.67 MIL/uL (ref 3.87–5.11)
RDW: 13 % (ref 11.5–15.5)
WBC: 11 10*3/uL — ABNORMAL HIGH (ref 4.0–10.5)
nRBC: 0 % (ref 0.0–0.2)

## 2020-03-17 LAB — URINALYSIS, ROUTINE W REFLEX MICROSCOPIC
Bilirubin Urine: NEGATIVE
Glucose, UA: NEGATIVE mg/dL
Ketones, ur: 5 mg/dL — AB
Leukocytes,Ua: NEGATIVE
Nitrite: NEGATIVE
Protein, ur: NEGATIVE mg/dL
Specific Gravity, Urine: 1.027 (ref 1.005–1.030)
pH: 5 (ref 5.0–8.0)

## 2020-03-17 LAB — CK: Total CK: 124 U/L (ref 38–234)

## 2020-03-17 NOTE — ED Notes (Signed)
Patient transported to CT 

## 2020-03-17 NOTE — ED Notes (Signed)
[facility]AR here to transport patient.  Report given to Alric Quan at [facility].  Patient's sister made aware.

## 2020-03-17 NOTE — ED Triage Notes (Signed)
Patient BIB [facility] from [facility] facility with an unwitnessed fall and is a left leg amputee. Patient has abrasion to forehead and bridge of nose. Patient acting her baseline but facility protocol to have her sent out. Patient not on blood thinners. EMS placed patient on C collar. Patient diabetic, CBG 154.  EMS vitals 158/78

## 2020-03-17 NOTE — Discharge Instructions (Addendum)
Your workup today was overall reasurring. Your urine did show some evidence of blood, please make sure to follow-up with your primary care doctor about this.  Return to the ER for any new or worsening symptoms.

## 2020-03-17 NOTE — ED Provider Notes (Signed)
[facility]-EMERGENCY DEPT Provider Note   CSN: [id number] Arrival date & time: 03/17/20  4503     History Chief Complaint  Patient presents with  . Fall    Alexis Becker is a 70 y.o. female.  HPI  Level 5 caveat 2/2 dementia 70 year old female with history of dementia, DM type II, thyroid disease presents to the ER after a fall at [facility] nursing facility.  I was able to speak with her facility, staff notes that this morning when they walked into the patient's room she was found to be on the floor.  They are not sure how long the patient was down.  Patient was acting per baseline per staff.  Patient not answering questions which is baseline per staff.  She has a left leg amputee.  On arrival, no visible facial droop, unilateral weakness.  Patient not following commands.    Past Medical History:  Diagnosis Date  . Dementia (HCC)   . Diabetes mellitus without complication (HCC)   . Thyroid disease     Patient Active Problem List   Diagnosis Date Noted  . Thrombocytopenia (HCC) 01/24/2020    History reviewed. No pertinent surgical history.   OB History   No obstetric history on file.     History reviewed. No pertinent family history.  Social History   Tobacco Use  . Smoking status: Unknown If Ever Smoked    Home Medications Prior to Admission medications   Medication Sig Start Date End Date Taking? Authorizing Provider  ARIPiprazole (ABILIFY) 2 MG tablet Take 2 mg by mouth daily. 01/21/20   [provider]  calcium carbonate (OS-CAL) 600 MG TABS tablet Take 600 mg by mouth 2 (two) times daily.    [provider]  clonazePAM (KLONOPIN) 0.5 MG tablet Take 0.25 mg by mouth 3 (three) times daily. 01/02/20   [provider]  Docusate Sodium 100 MG capsule Take 100 mg by mouth 2 (two) times daily.    [provider]  escitalopram (LEXAPRO) 20 MG tablet Take 20 mg by mouth daily. 01/21/20   [provider]   guaiFENesin (ROBITUSSIN) 100 MG/5ML liquid Take 200 mg by mouth every 4 (four) hours as needed for cough or congestion.    [provider]  LANTUS SOLOSTAR 100 UNIT/ML Solostar Pen Inject 14 Units into the skin every morning. Hold for sugar < 110; prime with 2 units prior to sue - use within 28 days once opened and use with safety pen needles. 01/08/20   [provider]  levothyroxine (SYNTHROID) 50 MCG tablet Take 50 mcg by mouth daily. 01/21/20   [provider]  lisinopril (ZESTRIL) 2.5 MG tablet Take 2.5 mg by mouth daily. 01/21/20   [provider]  loperamide (IMODIUM A-D) 2 MG tablet Take 2 mg by mouth 3 (three) times daily as needed for diarrhea or loose stools.    [provider]  melatonin 3 MG TABS tablet Take 3 mg by mouth at bedtime. 03/26/18   [provider]  Menthol, Topical Analgesic, (PAIN RELIEVING PATCH ULTRA ST EX) Apply 1 patch topically daily as needed (pain; apply to left stump).    [provider]  metFORMIN (GLUCOPHAGE) 1000 MG tablet Take 1,000 mg by mouth 2 (two) times daily. 01/21/20   [provider]  Multiple Vitamins-Minerals (HEALTHY EYES PO) Take 1 capsule by mouth daily.    [provider]  [facility] powder Apply 1 application topically 2 (two) times daily. Apply to groin  affected are until healed. 10/12/19   [provider]  pioglitazone (ACTOS) 15 MG tablet Take 15 mg by mouth daily. 01/21/20   [provider]  polyethylene glycol powder (GLYCOLAX/MIRALAX) 17 GM/SCOOP powder Take 17 g by mouth as needed (constipation).    [provider]  simvastatin (ZOCOR) 20 MG tablet Take 20 mg by mouth every evening. 01/21/20   [provider]  sodium chloride 1 g tablet Take 1 g by mouth 2 (two) times daily.    [provider]  thiamine 100 MG tablet Take 100 mg by mouth daily. 10/16/17   [provider]  vitamin B-12 (CYANOCOBALAMIN) 500 MCG tablet  Take 500 mcg by mouth daily.    [provider]  zinc oxide 20 % ointment Apply 1 application topically 3 (three) times daily as needed (apply to buttocks for redness).    [provider]    Allergies    Patient has no known allergies.  Review of Systems   Review of Systems  Unable to perform ROS: Dementia    Physical Exam Updated Vital Signs BP 122/90 (BP Location: Left Arm)   Pulse 67   Temp 98 F (36.7 C) (Axillary)   Resp 18   SpO2 99%   Physical Exam Vitals and nursing note reviewed.  Constitutional:      General: She is not in acute distress.    Appearance: She is well-developed and well-nourished.  HENT:     Head: Normocephalic and atraumatic.     Comments: Superficial abrasion to the forehead with some bruising as well as nasal bridge.  No visible step-offs or crepitus. No hemotympanum, battle sign.  No visible abrasion/lacerations to the head.  EOMs intact.  Pupils equal and reactive. Eyes:     Conjunctiva/sclera: Conjunctivae normal.  Neck:     Comments: Neck exam limited due to c-collar in place Cardiovascular:     Rate and Rhythm: Normal rate and regular rhythm.     Heart sounds: No murmur heard.   Pulmonary:     Effort: Pulmonary effort is normal. No respiratory distress.     Breath sounds: Normal breath sounds.  Abdominal:     Palpations: Abdomen is soft.     Tenderness: There is no abdominal tenderness.  Musculoskeletal:        General: No edema.     Cervical back: Neck supple.     Comments: Moving all 4 extremities without difficulty.  Left leg amputation without evidence of abrasions, deformities, sensations intact.  Skin:    General: Skin is warm and dry.     Comments: Superficial abrasion to the forehead with some bruising as well as nasal bridge.  No visible step-offs or crepitus.  Neurological:     Mental Status: She is alert.  Psychiatric:        Mood and Affect: Mood and affect normal.     ED Results / Procedures /  Treatments   Labs (all labs ordered are listed, but only abnormal results are displayed) Labs Reviewed  CBC WITH DIFFERENTIAL/PLATELET - Abnormal; Notable for the following components:      Result Value   WBC 11.0 (*)    Neutro Abs 8.5 (*)    All other components within normal limits  COMPREHENSIVE METABOLIC PANEL - Abnormal; Notable for the following components:   Glucose, Bld 120 (*)    BUN 25 (*)    All other components within normal limits  URINALYSIS, ROUTINE W REFLEX MICROSCOPIC - Abnormal; Notable  for the following components:   APPearance HAZY (*)    Hgb urine dipstick SMALL (*)    Ketones, ur 5 (*)    Bacteria, UA FEW (*)    All other components within normal limits  CK    EKG None  Radiology CT Head Wo Contrast  Result Date: 03/17/2020 CLINICAL DATA:  Unwitnessed fall with head trauma.  Dementia. EXAM: CT HEAD WITHOUT CONTRAST CT MAXILLOFACIAL WITHOUT CONTRAST CT CERVICAL SPINE WITHOUT CONTRAST TECHNIQUE: Multidetector CT imaging of the head, cervical spine, and maxillofacial structures were performed using the standard protocol without intravenous contrast. Multiplanar CT image reconstructions of the cervical spine and maxillofacial structures were also generated. COMPARISON:  None. FINDINGS: CT HEAD FINDINGS Brain: No evidence of acute infarction, hemorrhage, hydrocephalus, extra-axial collection or mass lesion/mass effect. Severe brain atrophy, especially in the frontal and temporal lobes and worse on the right. Remote lacunar infarct at the right caudate head. Vascular: No hyperdense vessel or unexpected calcification. Skull: No acute fracture CT MAXILLOFACIAL FINDINGS Osseous: No acute fracture or mandibular dislocation. Orbits: No evidence of injury Sinuses: Negative for hemosinus Soft tissues: Mild forehead swelling without opaque foreign body or gas. CT CERVICAL SPINE FINDINGS Alignment: Normal Skull base and vertebrae: C3-C7 ACDF with solid arthrodesis. No acute  fracture. No evidence of bone lesion Soft tissues and spinal canal: No prevertebral fluid or swelling. No visible canal hematoma. Disc levels: Multilevel solid arthrodesis. 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 Upper chest: No visible injury IMPRESSION: 1. No evidence of acute intracranial or cervical spine injury. 2. Forehead swelling without facial fracture. 3. Severe frontotemporal brain atrophy. Electronically Signed   By: Marnee Spring M.D.   On: 03/17/2020 07:49   CT Cervical Spine Wo Contrast  Result Date: 03/17/2020 CLINICAL DATA:  Unwitnessed fall with head trauma.  Dementia. EXAM: CT HEAD WITHOUT CONTRAST CT MAXILLOFACIAL WITHOUT CONTRAST CT CERVICAL SPINE WITHOUT CONTRAST TECHNIQUE: Multidetector CT imaging of the head, cervical spine, and maxillofacial structures were performed using the standard protocol without intravenous contrast. Multiplanar CT image reconstructions of the cervical spine and maxillofacial structures were also generated. COMPARISON:  None. FINDINGS: CT HEAD FINDINGS Brain: No evidence of acute infarction, hemorrhage, hydrocephalus, extra-axial collection or mass lesion/mass effect. Severe brain atrophy, especially in the frontal and temporal lobes and worse on the right. Remote lacunar infarct at the right caudate head. Vascular: No hyperdense vessel or unexpected calcification. Skull: No acute fracture CT MAXILLOFACIAL FINDINGS Osseous: No acute fracture or mandibular dislocation. Orbits: No evidence of injury Sinuses: Negative for hemosinus Soft tissues: Mild forehead swelling without opaque foreign body or gas. CT CERVICAL SPINE FINDINGS Alignment: Normal Skull base and vertebrae: C3-C7 ACDF with solid arthrodesis. No acute fracture. No evidence of bone lesion Soft tissues and spinal canal: No prevertebral fluid or swelling. No visible canal hematoma. Disc levels: Multilevel solid arthrodesis. 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 Upper chest: No visible injury IMPRESSION:  1. No evidence of acute intracranial or cervical spine injury. 2. Forehead swelling without facial fracture. 3. Severe frontotemporal brain atrophy. Electronically Signed   By: Marnee Spring M.D.   On: 03/17/2020 07:49   CT Maxillofacial Wo Contrast  Result Date: 03/17/2020 CLINICAL DATA:  Unwitnessed fall with head trauma.  Dementia. EXAM: CT HEAD WITHOUT CONTRAST CT MAXILLOFACIAL WITHOUT CONTRAST CT CERVICAL SPINE WITHOUT CONTRAST TECHNIQUE: Multidetector CT imaging of the head, cervical spine, and maxillofacial structures were performed using the standard protocol without intravenous contrast. Multiplanar CT image reconstructions of the cervical spine and maxillofacial structures  were also generated. COMPARISON:  None. FINDINGS: CT HEAD FINDINGS Brain: No evidence of acute infarction, hemorrhage, hydrocephalus, extra-axial collection or mass lesion/mass effect. Severe brain atrophy, especially in the frontal and temporal lobes and worse on the right. Remote lacunar infarct at the right caudate head. Vascular: No hyperdense vessel or unexpected calcification. Skull: No acute fracture CT MAXILLOFACIAL FINDINGS Osseous: No acute fracture or mandibular dislocation. Orbits: No evidence of injury Sinuses: Negative for hemosinus Soft tissues: Mild forehead swelling without opaque foreign body or gas. CT CERVICAL SPINE FINDINGS Alignment: Normal Skull base and vertebrae: C3-C7 ACDF with solid arthrodesis. No acute fracture. No evidence of bone lesion Soft tissues and spinal canal: No prevertebral fluid or swelling. No visible canal hematoma. Disc levels: Multilevel solid arthrodesis. 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 Upper chest: No visible injury IMPRESSION: 1. No evidence of acute intracranial or cervical spine injury. 2. Forehead swelling without facial fracture. 3. Severe frontotemporal brain atrophy. Electronically Signed   By: Marnee Spring M.D.   On: 03/17/2020 07:49    Procedures Procedures    Medications Ordered in ED Medications - No data to display  ED Course  I have reviewed the triage vital signs and the nursing notes.  Pertinent labs & imaging results that were available during my care of the patient were reviewed by me and considered in my medical decision making (see chart for details).    MDM Rules/Calculators/A&P                          70 year old female presents to the ER after a fall.  Demented at baseline.  Nursing staff reported that she has been acting per her baseline mental status after the fall.  She has visible superficial abrasions to her forehead with some bruising and nasal bridge.  She is not following commands.  Vitals overall reassuring on arrival.  CT of the head, max face, C-spine without evidence of any acute injuries.  Plan for labs, UA, suspect she will be stable for discharge.  I personally ordered, reviewed and interpreted her labs -CBC with a mild leukocytosis of 11 -CMP without any significant electrode abnormalities, glucose of 120, slightly elevated BUN of 25 but normal creatinine.  Normal LFTs. -CK normal -UA with small amount of hemoglobin, ketones, few bacteria.  No significant evidence of UTI, no prior UA to compare if the hemoglobin in her urine is at baseline.  Overall work-up reassuring.  No evidence of metabolic derangement as a cause of her fall.  CT scan without evidence of stroke or injuries from the fall. I spoke with her sister Wallene Huh who is her power of attorney and informed her of the reassuring work-up.  I did inform her to follow-up with her PCP about the hematuria.  Stable for discharge at this time.  This was a shared visit with my supervising physician Dr. Stevie Kern who independently saw and evaluated the patient & provided guidance in evaluation/management/disposition ,in agreement with care   Final Clinical Impression(s) / ED Diagnoses Final diagnoses:  Fall, initial encounter    Rx / DC Orders ED Discharge  Orders    None       Leone Brand 03/17/20 [id number]    Milagros Loll, MD 03/17/20 [id number]

## 2020-03-17 NOTE — ED Notes (Addendum)
[facility] called for transport.  Attempted to give report to [place], left message.

## 2020-03-17 NOTE — ED Notes (Signed)
Patient back from CT.

## 2020-03-18 DIAGNOSIS — Z1159 Encounter for screening for other viral diseases: Secondary | ICD-10-CM | POA: Diagnosis not present

## 2020-03-22 DIAGNOSIS — Z1159 Encounter for screening for other viral diseases: Secondary | ICD-10-CM | POA: Diagnosis not present

## 2020-03-25 DIAGNOSIS — Z1159 Encounter for screening for other viral diseases: Secondary | ICD-10-CM | POA: Diagnosis not present

## 2020-03-29 DIAGNOSIS — Z1159 Encounter for screening for other viral diseases: Secondary | ICD-10-CM | POA: Diagnosis not present

## 2020-04-01 DIAGNOSIS — Z1159 Encounter for screening for other viral diseases: Secondary | ICD-10-CM | POA: Diagnosis not present

## 2020-04-05 DIAGNOSIS — Z1159 Encounter for screening for other viral diseases: Secondary | ICD-10-CM | POA: Diagnosis not present

## 2020-04-08 DIAGNOSIS — Z1159 Encounter for screening for other viral diseases: Secondary | ICD-10-CM | POA: Diagnosis not present

## 2020-04-12 DIAGNOSIS — Z1159 Encounter for screening for other viral diseases: Secondary | ICD-10-CM | POA: Diagnosis not present

## 2020-04-15 DIAGNOSIS — Z1159 Encounter for screening for other viral diseases: Secondary | ICD-10-CM | POA: Diagnosis not present

## 2020-04-19 DIAGNOSIS — Z1159 Encounter for screening for other viral diseases: Secondary | ICD-10-CM | POA: Diagnosis not present

## 2020-04-22 DIAGNOSIS — Z1159 Encounter for screening for other viral diseases: Secondary | ICD-10-CM | POA: Diagnosis not present

## 2020-04-26 DIAGNOSIS — Z79899 Other long term (current) drug therapy: Secondary | ICD-10-CM | POA: Diagnosis not present

## 2020-04-26 DIAGNOSIS — K5901 Slow transit constipation: Secondary | ICD-10-CM | POA: Diagnosis not present

## 2020-04-26 DIAGNOSIS — Z1159 Encounter for screening for other viral diseases: Secondary | ICD-10-CM | POA: Diagnosis not present

## 2020-04-26 DIAGNOSIS — E039 Hypothyroidism, unspecified: Secondary | ICD-10-CM | POA: Diagnosis not present

## 2020-04-29 DIAGNOSIS — Z1159 Encounter for screening for other viral diseases: Secondary | ICD-10-CM | POA: Diagnosis not present

## 2020-05-03 DIAGNOSIS — Z1159 Encounter for screening for other viral diseases: Secondary | ICD-10-CM | POA: Diagnosis not present

## 2020-05-06 DIAGNOSIS — Z1159 Encounter for screening for other viral diseases: Secondary | ICD-10-CM | POA: Diagnosis not present

## 2020-05-10 DIAGNOSIS — Z1159 Encounter for screening for other viral diseases: Secondary | ICD-10-CM | POA: Diagnosis not present

## 2020-05-13 DIAGNOSIS — Z1159 Encounter for screening for other viral diseases: Secondary | ICD-10-CM | POA: Diagnosis not present

## 2020-05-17 DIAGNOSIS — Z1159 Encounter for screening for other viral diseases: Secondary | ICD-10-CM | POA: Diagnosis not present

## 2020-05-20 DIAGNOSIS — Z1159 Encounter for screening for other viral diseases: Secondary | ICD-10-CM | POA: Diagnosis not present

## 2020-05-24 DIAGNOSIS — Z79899 Other long term (current) drug therapy: Secondary | ICD-10-CM | POA: Diagnosis not present

## 2020-05-24 DIAGNOSIS — E1151 Type 2 diabetes mellitus with diabetic peripheral angiopathy without gangrene: Secondary | ICD-10-CM | POA: Diagnosis not present

## 2020-05-24 DIAGNOSIS — Z794 Long term (current) use of insulin: Secondary | ICD-10-CM | POA: Diagnosis not present

## 2020-05-24 DIAGNOSIS — Z1159 Encounter for screening for other viral diseases: Secondary | ICD-10-CM | POA: Diagnosis not present

## 2020-05-27 DIAGNOSIS — Z1159 Encounter for screening for other viral diseases: Secondary | ICD-10-CM | POA: Diagnosis not present

## 2020-05-31 DIAGNOSIS — Z1159 Encounter for screening for other viral diseases: Secondary | ICD-10-CM | POA: Diagnosis not present

## 2020-06-03 DIAGNOSIS — Z1159 Encounter for screening for other viral diseases: Secondary | ICD-10-CM | POA: Diagnosis not present

## 2020-06-07 DIAGNOSIS — Z1159 Encounter for screening for other viral diseases: Secondary | ICD-10-CM | POA: Diagnosis not present

## 2020-06-10 DIAGNOSIS — Z1159 Encounter for screening for other viral diseases: Secondary | ICD-10-CM | POA: Diagnosis not present

## 2020-06-14 DIAGNOSIS — Z1159 Encounter for screening for other viral diseases: Secondary | ICD-10-CM | POA: Diagnosis not present

## 2020-06-17 DIAGNOSIS — Z1159 Encounter for screening for other viral diseases: Secondary | ICD-10-CM | POA: Diagnosis not present

## 2020-06-21 DIAGNOSIS — Z79899 Other long term (current) drug therapy: Secondary | ICD-10-CM | POA: Diagnosis not present

## 2020-06-21 DIAGNOSIS — I10 Essential (primary) hypertension: Secondary | ICD-10-CM | POA: Diagnosis not present

## 2020-06-21 DIAGNOSIS — F5101 Primary insomnia: Secondary | ICD-10-CM | POA: Diagnosis not present

## 2020-06-21 DIAGNOSIS — Z1159 Encounter for screening for other viral diseases: Secondary | ICD-10-CM | POA: Diagnosis not present

## 2020-06-21 DIAGNOSIS — R634 Abnormal weight loss: Secondary | ICD-10-CM | POA: Diagnosis not present

## 2020-06-24 DIAGNOSIS — Z1159 Encounter for screening for other viral diseases: Secondary | ICD-10-CM | POA: Diagnosis not present

## 2020-06-28 DIAGNOSIS — Z1159 Encounter for screening for other viral diseases: Secondary | ICD-10-CM | POA: Diagnosis not present

## 2020-07-01 DIAGNOSIS — Z1159 Encounter for screening for other viral diseases: Secondary | ICD-10-CM | POA: Diagnosis not present

## 2020-07-05 DIAGNOSIS — Z1159 Encounter for screening for other viral diseases: Secondary | ICD-10-CM | POA: Diagnosis not present

## 2020-07-08 DIAGNOSIS — Z1159 Encounter for screening for other viral diseases: Secondary | ICD-10-CM | POA: Diagnosis not present

## 2020-07-12 DIAGNOSIS — Z1159 Encounter for screening for other viral diseases: Secondary | ICD-10-CM | POA: Diagnosis not present

## 2020-07-15 DIAGNOSIS — Z1159 Encounter for screening for other viral diseases: Secondary | ICD-10-CM | POA: Diagnosis not present

## 2020-07-19 DIAGNOSIS — I10 Essential (primary) hypertension: Secondary | ICD-10-CM | POA: Diagnosis not present

## 2020-07-19 DIAGNOSIS — Z79899 Other long term (current) drug therapy: Secondary | ICD-10-CM | POA: Diagnosis not present

## 2020-07-19 DIAGNOSIS — Z1159 Encounter for screening for other viral diseases: Secondary | ICD-10-CM | POA: Diagnosis not present

## 2020-07-22 DIAGNOSIS — Z1159 Encounter for screening for other viral diseases: Secondary | ICD-10-CM | POA: Diagnosis not present

## 2020-07-26 DIAGNOSIS — Z1159 Encounter for screening for other viral diseases: Secondary | ICD-10-CM | POA: Diagnosis not present

## 2020-07-29 DIAGNOSIS — Z1159 Encounter for screening for other viral diseases: Secondary | ICD-10-CM | POA: Diagnosis not present

## 2020-08-02 DIAGNOSIS — Z1159 Encounter for screening for other viral diseases: Secondary | ICD-10-CM | POA: Diagnosis not present

## 2020-08-05 DIAGNOSIS — Z1159 Encounter for screening for other viral diseases: Secondary | ICD-10-CM | POA: Diagnosis not present

## 2020-08-09 DIAGNOSIS — Z1159 Encounter for screening for other viral diseases: Secondary | ICD-10-CM | POA: Diagnosis not present

## 2020-08-12 DIAGNOSIS — Z1159 Encounter for screening for other viral diseases: Secondary | ICD-10-CM | POA: Diagnosis not present

## 2020-08-16 DIAGNOSIS — Z1159 Encounter for screening for other viral diseases: Secondary | ICD-10-CM | POA: Diagnosis not present

## 2020-08-16 DIAGNOSIS — E1165 Type 2 diabetes mellitus with hyperglycemia: Secondary | ICD-10-CM | POA: Diagnosis not present

## 2020-08-16 DIAGNOSIS — Z794 Long term (current) use of insulin: Secondary | ICD-10-CM | POA: Diagnosis not present

## 2020-08-16 DIAGNOSIS — Z79899 Other long term (current) drug therapy: Secondary | ICD-10-CM | POA: Diagnosis not present

## 2020-08-19 DIAGNOSIS — Z1159 Encounter for screening for other viral diseases: Secondary | ICD-10-CM | POA: Diagnosis not present

## 2020-08-23 DIAGNOSIS — Z1159 Encounter for screening for other viral diseases: Secondary | ICD-10-CM | POA: Diagnosis not present

## 2020-08-26 DIAGNOSIS — Z1159 Encounter for screening for other viral diseases: Secondary | ICD-10-CM | POA: Diagnosis not present

## 2020-08-30 DIAGNOSIS — Z1159 Encounter for screening for other viral diseases: Secondary | ICD-10-CM | POA: Diagnosis not present

## 2020-09-02 DIAGNOSIS — Z1159 Encounter for screening for other viral diseases: Secondary | ICD-10-CM | POA: Diagnosis not present

## 2020-09-06 DIAGNOSIS — Z79899 Other long term (current) drug therapy: Secondary | ICD-10-CM | POA: Diagnosis not present

## 2020-09-06 DIAGNOSIS — Z1159 Encounter for screening for other viral diseases: Secondary | ICD-10-CM | POA: Diagnosis not present

## 2020-09-06 DIAGNOSIS — R5383 Other fatigue: Secondary | ICD-10-CM | POA: Diagnosis not present

## 2020-09-09 DIAGNOSIS — Z1159 Encounter for screening for other viral diseases: Secondary | ICD-10-CM | POA: Diagnosis not present

## 2020-09-13 DIAGNOSIS — Z1159 Encounter for screening for other viral diseases: Secondary | ICD-10-CM | POA: Diagnosis not present

## 2020-09-13 DIAGNOSIS — N3000 Acute cystitis without hematuria: Secondary | ICD-10-CM | POA: Diagnosis not present

## 2020-09-13 DIAGNOSIS — Z79899 Other long term (current) drug therapy: Secondary | ICD-10-CM | POA: Diagnosis not present

## 2020-09-16 DIAGNOSIS — Z1159 Encounter for screening for other viral diseases: Secondary | ICD-10-CM | POA: Diagnosis not present

## 2020-09-20 DIAGNOSIS — N3000 Acute cystitis without hematuria: Secondary | ICD-10-CM | POA: Diagnosis not present

## 2020-09-20 DIAGNOSIS — W19XXXD Unspecified fall, subsequent encounter: Secondary | ICD-10-CM | POA: Diagnosis not present

## 2020-09-22 DIAGNOSIS — Z8616 Personal history of COVID-19: Secondary | ICD-10-CM | POA: Diagnosis not present

## 2020-10-15 ENCOUNTER — Other Ambulatory Visit: Payer: Self-pay

## 2020-10-15 ENCOUNTER — Emergency Department (HOSPITAL_COMMUNITY): Payer: Medicare Other

## 2020-10-15 ENCOUNTER — Emergency Department (HOSPITAL_COMMUNITY)
Admission: EM | Admit: 2020-10-15 | Discharge: 2020-10-16 | Disposition: A | Discharge status: Skilled Nursing Facility | Payer: Medicare Other | Attending: Emergency Medicine | Admitting: Emergency Medicine

## 2020-10-15 ENCOUNTER — Encounter (HOSPITAL_COMMUNITY): Payer: Self-pay | Admitting: *Deleted

## 2020-10-15 DIAGNOSIS — E119 Type 2 diabetes mellitus without complications: Secondary | ICD-10-CM | POA: Insufficient documentation

## 2020-10-15 DIAGNOSIS — W19XXXA Unspecified fall, initial encounter: Secondary | ICD-10-CM | POA: Diagnosis not present

## 2020-10-15 DIAGNOSIS — Z743 Need for continuous supervision: Secondary | ICD-10-CM | POA: Diagnosis not present

## 2020-10-15 DIAGNOSIS — S0990XA Unspecified injury of head, initial encounter: Secondary | ICD-10-CM | POA: Diagnosis not present

## 2020-10-15 DIAGNOSIS — R6889 Other general symptoms and signs: Secondary | ICD-10-CM | POA: Diagnosis not present

## 2020-10-15 DIAGNOSIS — S199XXA Unspecified injury of neck, initial encounter: Secondary | ICD-10-CM | POA: Diagnosis not present

## 2020-10-15 DIAGNOSIS — Z794 Long term (current) use of insulin: Secondary | ICD-10-CM | POA: Diagnosis not present

## 2020-10-15 DIAGNOSIS — G319 Degenerative disease of nervous system, unspecified: Secondary | ICD-10-CM | POA: Diagnosis not present

## 2020-10-15 DIAGNOSIS — W07XXXA Fall from chair, initial encounter: Secondary | ICD-10-CM | POA: Insufficient documentation

## 2020-10-15 DIAGNOSIS — Z7984 Long term (current) use of oral hypoglycemic drugs: Secondary | ICD-10-CM | POA: Insufficient documentation

## 2020-10-15 DIAGNOSIS — R9431 Abnormal electrocardiogram [ECG] [EKG]: Secondary | ICD-10-CM | POA: Diagnosis not present

## 2020-10-15 DIAGNOSIS — Z981 Arthrodesis status: Secondary | ICD-10-CM | POA: Diagnosis not present

## 2020-10-15 DIAGNOSIS — M47812 Spondylosis without myelopathy or radiculopathy, cervical region: Secondary | ICD-10-CM | POA: Diagnosis not present

## 2020-10-15 DIAGNOSIS — F0390 Unspecified dementia without behavioral disturbance: Secondary | ICD-10-CM | POA: Diagnosis not present

## 2020-10-15 DIAGNOSIS — Z79899 Other long term (current) drug therapy: Secondary | ICD-10-CM | POA: Diagnosis not present

## 2020-10-15 DIAGNOSIS — R531 Weakness: Secondary | ICD-10-CM | POA: Diagnosis not present

## 2020-10-15 DIAGNOSIS — Z87440 Personal history of urinary (tract) infections: Secondary | ICD-10-CM | POA: Diagnosis not present

## 2020-10-15 LAB — CBC
HCT: 32.9 % — ABNORMAL LOW (ref 36.0–46.0)
Hemoglobin: 10.7 g/dL — ABNORMAL LOW (ref 12.0–15.0)
MCH: 28.3 pg (ref 26.0–34.0)
MCHC: 32.5 g/dL (ref 30.0–36.0)
MCV: 87 fL (ref 80.0–100.0)
Platelets: 335 10*3/uL (ref 150–400)
RBC: 3.78 MIL/uL — ABNORMAL LOW (ref 3.87–5.11)
RDW: 13.1 % (ref 11.5–15.5)
WBC: 10.5 10*3/uL (ref 4.0–10.5)
nRBC: 0 % (ref 0.0–0.2)

## 2020-10-15 LAB — URINALYSIS, ROUTINE W REFLEX MICROSCOPIC
Bilirubin Urine: NEGATIVE
Glucose, UA: NEGATIVE mg/dL
Ketones, ur: NEGATIVE mg/dL
Nitrite: NEGATIVE
Protein, ur: NEGATIVE mg/dL
Specific Gravity, Urine: 1.027 (ref 1.005–1.030)
pH: 5 (ref 5.0–8.0)

## 2020-10-15 LAB — BASIC METABOLIC PANEL
Anion gap: 9 (ref 5–15)
BUN: 27 mg/dL — ABNORMAL HIGH (ref 8–23)
CO2: 23 mmol/L (ref 22–32)
Calcium: 10 mg/dL (ref 8.9–10.3)
Chloride: 107 mmol/L (ref 98–111)
Creatinine, Ser: 0.79 mg/dL (ref 0.44–1.00)
GFR, Estimated: >60 mL/min (ref >60)
Glucose, Bld: 131 mg/dL — ABNORMAL HIGH (ref 70–99)
Potassium: 4.4 mmol/L (ref 3.5–5.1)
Sodium: 139 mmol/L (ref 135–145)

## 2020-10-15 MED ORDER — CEPHALEXIN 500 MG PO CAPS: 500.0000 mg | ORAL_CAPSULE | Freq: Three times a day (TID) | ORAL | 0 refills | Status: AC

## 2020-10-15 NOTE — ED Provider Notes (Signed)
[facility] EMERGENCY DEPARTMENT Provider Note   CSN: [id number] Arrival date & time: 10/15/20  2131     History Chief Complaint  Patient presents with   Marletta Lor    Alexis Becker is a 70 y.o. female.   Fall   Patient has a history of dementia and resides in a memory care facility.  According to the EMS report staff was helping move the patient to the chair.  Patient ended up slipping on the chair and falling to the floor.  Unclear if she hit her head.  According to the EMS report the patient is at her baseline mental status.  Daughter was concerned that the patient might have a urinary tract infection as she has had weakness and falls in the past associated with UTIs.  Patient herself is not able to provide any history.  Past Medical History:  Diagnosis Date   Dementia (HCC)    Diabetes mellitus without complication (HCC)    Thyroid disease     Patient Active Problem List   Diagnosis Date Noted   Thrombocytopenia (HCC) 01/24/2020    History reviewed. No pertinent surgical history.   OB History   No obstetric history on file.     No family history on file.  Social History   Tobacco Use   Smoking status: Unknown    Home Medications Prior to Admission medications   Medication Sig Start Date End Date Taking? Authorizing Provider  cephALEXin (KEFLEX) 500 MG capsule Take 1 capsule (500 mg total) by mouth 3 (three) times daily for 7 days. 10/15/20 10/22/20 Yes Linwood Dibbles, MD  ARIPiprazole (ABILIFY) 2 MG tablet Take 2 mg by mouth daily. 01/21/20   [provider]  calcium carbonate (OS-CAL) 600 MG TABS tablet Take 600 mg by mouth 2 (two) times daily.    [provider]  clonazePAM (KLONOPIN) 0.5 MG tablet Take 0.25 mg by mouth 3 (three) times daily. 01/02/20   [provider]  Docusate Sodium 100 MG capsule Take 100 mg by mouth 2 (two) times daily.    [provider]  escitalopram (LEXAPRO) 20 MG tablet Take 20 mg by mouth  daily. 01/21/20   [provider]  guaiFENesin (ROBITUSSIN) 100 MG/5ML liquid Take 200 mg by mouth every 4 (four) hours as needed for cough or congestion.    [provider]  LANTUS SOLOSTAR 100 UNIT/ML Solostar Pen Inject 14 Units into the skin every morning. Hold for sugar < 110; prime with 2 units prior to sue - use within 28 days once opened and use with safety pen needles. 01/08/20   [provider]  levothyroxine (SYNTHROID) 50 MCG tablet Take 50 mcg by mouth daily. 01/21/20   [provider]  lisinopril (ZESTRIL) 2.5 MG tablet Take 2.5 mg by mouth daily. 01/21/20   [provider]  loperamide (IMODIUM A-D) 2 MG tablet Take 2 mg by mouth 3 (three) times daily as needed for diarrhea or loose stools.    [provider]  melatonin 3 MG TABS tablet Take 3 mg by mouth at bedtime. 03/26/18   [provider]  Menthol, Topical Analgesic, (PAIN RELIEVING PATCH ULTRA ST EX) Apply 1 patch topically daily as needed (pain; apply to left stump).    [provider]  metFORMIN (GLUCOPHAGE) 1000 MG tablet Take 1,000 mg by mouth 2 (two) times daily. 01/21/20   [provider]  Multiple Vitamins-Minerals (HEALTHY EYES PO) Take 1 capsule by mouth daily.    [provider]  [facility] powder Apply 1 application topically 2 (two) times daily. Apply to groin affected are until healed. 10/12/19   [provider]  pioglitazone (ACTOS) 15 MG tablet Take 15 mg by mouth daily. 01/21/20   [provider]  polyethylene glycol powder (GLYCOLAX/MIRALAX) 17 GM/SCOOP powder Take 17 g by mouth as needed (constipation).    [provider]  simvastatin (ZOCOR) 20 MG tablet Take 20 mg by mouth every evening. 01/21/20   [provider]  sodium chloride 1 g tablet Take 1 g by mouth 2 (two) times daily.    [provider]  thiamine 100 MG tablet Take 100 mg by mouth daily. 10/16/17   [provider]   vitamin B-12 (CYANOCOBALAMIN) 500 MCG tablet Take 500 mcg by mouth daily.    [provider]  zinc oxide 20 % ointment Apply 1 application topically 3 (three) times daily as needed (apply to buttocks for redness).    [provider]    Allergies    Patient has no known allergies.  Review of Systems   Review of Systems  Unable to perform ROS: Dementia   Physical Exam Updated Vital Signs BP 126/62   Pulse 78   Temp 98.1 F (36.7 C)   Resp 20   Ht 1.651 m (5\' 5" )   Wt 113.4 kg   SpO2 98%   BMI 41.60 kg/m   Physical Exam Vitals and nursing note reviewed.  Constitutional:      Appearance: She is well-developed. She is not ill-appearing or diaphoretic.  HENT:     Head: Normocephalic and atraumatic.     Comments: Cervical spine collar in place    Right Ear: External ear normal.     Left Ear: External ear normal.  Eyes:     General: No scleral icterus.       Right eye: No discharge.        Left eye: No discharge.     Conjunctiva/sclera: Conjunctivae normal.  Neck:     Trachea: No tracheal deviation.  Cardiovascular:     Rate and Rhythm: Normal rate and regular rhythm.  Pulmonary:     Effort: Pulmonary effort is normal. No respiratory distress.     Breath sounds: Normal breath sounds. No stridor. No wheezing or rales.  Abdominal:     General: Bowel sounds are normal. There is no distension.     Palpations: Abdomen is soft.     Tenderness: There is no abdominal tenderness. There is no guarding or rebound.  Musculoskeletal:        General: No tenderness or deformity.     Cervical back: Neck supple.     Comments: Status post AKA left lower extremity  Skin:    General: Skin is warm and dry.     Findings: No rash.  Neurological:     Mental Status: She is alert. Mental status is at baseline.     Cranial Nerves: No cranial nerve deficit (no facial droop, patient does track me with her eyes, patient nonverbal).     Sensory: No sensory deficit.     Motor:  No abnormal muscle tone or seizure activity.     Coordination: Coordination normal.     Comments: Patient does not follow commands consistently but she does squeeze my fingers with both hands  Psychiatric:     Comments: Flat affect    ED Results / Procedures / Treatments   Labs (all labs ordered are listed, but only  abnormal results are displayed) Labs Reviewed  CBC - Abnormal; Notable for the following components:      Result Value   RBC 3.78 (*)    Hemoglobin 10.7 (*)    HCT 32.9 (*)    All other components within normal limits  BASIC METABOLIC PANEL - Abnormal; Notable for the following components:   Glucose, Bld 131 (*)    BUN 27 (*)    All other components within normal limits  URINALYSIS, ROUTINE W REFLEX MICROSCOPIC - Abnormal; Notable for the following components:   APPearance HAZY (*)    Hgb urine dipstick SMALL (*)    Leukocytes,Ua MODERATE (*)    Bacteria, UA RARE (*)    All other components within normal limits  URINE CULTURE    EKG EKG Interpretation  Date/Time:  Friday October 15 2020 21:38:08 EDT Ventricular Rate:  77 PR Interval:  135 QRS Duration: 101 QT Interval:  367 QTC Calculation: 416 R Axis:   44 Text Interpretation: Sinus rhythm Low voltage, extremity and precordial leads No significant change since last tracing No significant change since last tracing Confirmed by Linwood Dibbles [phone]) on 10/15/2020 9:46:34 PM  Radiology CT HEAD WO CONTRAST ( )  Result Date: 10/15/2020 CLINICAL DATA:  Head trauma, minor (Age >= 65y); Neck trauma (Age >= 65y). Fall, possible head injury, dementia EXAM: CT HEAD WITHOUT CONTRAST CT CERVICAL SPINE WITHOUT CONTRAST TECHNIQUE: Multidetector CT imaging of the head and cervical spine was performed following the standard protocol without intravenous contrast. Multiplanar CT image reconstructions of the cervical spine were also generated. COMPARISON:  03/17/2020 FINDINGS: CT HEAD FINDINGS Brain: Marked parenchymal atrophy is  again noted, asymmetrically involving the temporal lobes and right frontal lobe, stable since prior examination. 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 No acute intracranial hemorrhage or infarct. No abnormal mass effect or midline shift. No abnormal intra or extra-axial mass lesion or fluid collection. Mild ventriculomegaly, likely related to central atrophy, appears stable. Cerebellum is unremarkable. Vascular: No asymmetric hyperdense vasculature at the skull base. Skull: Intact. Sinuses/Orbits: The orbits are unremarkable. The paranasal sinuses are clear. Other: The mastoid air cells and middle ear cavities are clear. CT CERVICAL SPINE FINDINGS Alignment: Normal cervical lordosis. No listhesis. Anterior cervical discectomy and fusion with instrumentation of C3-C7 has been performed with solid arthrodesis. Arthrodesis of the left C3-C5 facet joints and right C4-5 facet joint is again noted. Skull base and vertebrae: Craniocervical alignment is normal. The atlantodental interval is not widened. No acute fracture of the cervical spine. Soft tissues and spinal canal: No prevertebral soft tissue swelling or fluid. No paraspinal fluid collections. No cervical adenopathy. The spinal canal is widely patent. No canal hematoma. Disc levels: Solid arthrodesis C3-C7. Moderate intervertebral disc space narrowing and endplate remodeling at C7-T1 in keeping with degenerative disc disease. The prevertebral soft tissues are not thickened on sagittal reformats. Review of the axial images demonstrates combination uncovertebral and facet arthrosis resulting in multilevel neuroforaminal narrowing, most severe on the left at C3-4, on the right at C5-6, and bilaterally at C6-7. Upper chest: Unremarkable Other: None IMPRESSION: No acute intracranial injury.  No calvarial fracture. No acute fracture or listhesis of the cervical spine. Electronically Signed   By: Helyn Numbers M.D.   On: 10/15/2020 23:16    CT Cervical Spine Wo Contrast  Result Date: 10/15/2020 CLINICAL DATA:  Head trauma, minor (Age >= 65y); Neck trauma (Age >= 65y). Fall, possible head injury, dementia EXAM: CT HEAD WITHOUT CONTRAST  CT CERVICAL SPINE WITHOUT CONTRAST TECHNIQUE: Multidetector CT imaging of the head and cervical spine was performed following the standard protocol without intravenous contrast. Multiplanar CT image reconstructions of the cervical spine were also generated. COMPARISON:  03/17/2020 FINDINGS: CT HEAD FINDINGS Brain: Marked parenchymal atrophy is again noted, asymmetrically involving the temporal lobes and right frontal lobe, stable since prior examination. 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 No acute intracranial hemorrhage or infarct. No abnormal mass effect or midline shift. No abnormal intra or extra-axial mass lesion or fluid collection. Mild ventriculomegaly, likely related to central atrophy, appears stable. Cerebellum is unremarkable. Vascular: No asymmetric hyperdense vasculature at the skull base. Skull: Intact. Sinuses/Orbits: The orbits are unremarkable. The paranasal sinuses are clear. Other: The mastoid air cells and middle ear cavities are clear. CT CERVICAL SPINE FINDINGS Alignment: Normal cervical lordosis. No listhesis. Anterior cervical discectomy and fusion with instrumentation of C3-C7 has been performed with solid arthrodesis. Arthrodesis of the left C3-C5 facet joints and right C4-5 facet joint is again noted. Skull base and vertebrae: Craniocervical alignment is normal. The atlantodental interval is not widened. No acute fracture of the cervical spine. Soft tissues and spinal canal: No prevertebral soft tissue swelling or fluid. No paraspinal fluid collections. No cervical adenopathy. The spinal canal is widely patent. No canal hematoma. Disc levels: Solid arthrodesis C3-C7. Moderate intervertebral disc space narrowing and endplate remodeling at C7-T1 in keeping  with degenerative disc disease. The prevertebral soft tissues are not thickened on sagittal reformats. Review of the axial images demonstrates combination uncovertebral and facet arthrosis resulting in multilevel neuroforaminal narrowing, most severe on the left at C3-4, on the right at C5-6, and bilaterally at C6-7. Upper chest: Unremarkable Other: None IMPRESSION: No acute intracranial injury.  No calvarial fracture. No acute fracture or listhesis of the cervical spine. Electronically Signed   By: Helyn Numbers M.D.   On: 10/15/2020 23:16   DG Chest Portable 1 View  Result Date: 10/15/2020 CLINICAL DATA:  Weakness EXAM: PORTABLE CHEST 1 VIEW COMPARISON:  None. FINDINGS: Low lung volumes. Heart and mediastinal contours are within normal limits. No focal opacities or effusions. No acute bony abnormality. IMPRESSION: No active disease. Electronically Signed   By: Charlett Nose M.D.   On: 10/15/2020 22:16    Procedures Procedures   Medications Ordered in ED Medications - No data to display  ED Course  I have reviewed the triage vital signs and the nursing notes.  Pertinent labs & imaging results that were available during my care of the patient were reviewed by me and considered in my medical decision making (see chart for details).  Clinical Course as of 10/16/20 0000  Fri Oct 15, 2020  2340 Hemoglobin decreased compared to 7 months ago. [JK]  2341 Metabolic panel normal. [JK]  2341 CT without acute injury [JK]  2341 Chest x-ray without acute finding [JK]    Clinical Course User Index [JK] Linwood Dibbles, MD   MDM Rules/Calculators/A&P                           Patient presented to the ED for evaluation after fall at the nursing facility.  Patient has history of dementia.  She is unable to provide any significant history.  Patient only is able to verbalize the word "[place]" CT scans without signs of serious injury.  No signs of stroke or hemorrhage.  Laboratory tests are notable for anemia  but rectal exam  was performed and the stool was guaiac negative.  No signs of acute GI bleeding.  Family is concerned about the possibility of UTI.  Patient's urinalysis is abnormal.  Unclear if this is true infection versus colonization as the patient is unable to tell us if she is having symptoms.  We will send off a urine culture and treat empirically with antibiotics.  Stable for discharge back to the nursing facility. Final Clinical Impression(s) / ED Diagnoses Final diagnoses:  Fall, initial encounter    Rx / DC Orders ED Discharge Orders          Ordered    cephALEXin (KEFLEX) 500 MG capsule  3 times daily        10/15/20 2359             Linwood Dibbles, MD 10/16/20 0002

## 2020-10-15 NOTE — ED Triage Notes (Signed)
Pt from [facility] memory care post fall. Per EMS report, staff was using a chair to help assist with moving patient, Pt slipped out of chair and fell onto floor. Unsure if pt hit her head, alert to self. Per report pt at baseline. C-collar in place. Daughter reported that pt's previous falls have been due to UTis. Left aka

## 2020-10-16 DIAGNOSIS — Z743 Need for continuous supervision: Secondary | ICD-10-CM | POA: Diagnosis not present

## 2020-10-16 DIAGNOSIS — R404 Transient alteration of awareness: Secondary | ICD-10-CM | POA: Diagnosis not present

## 2020-10-16 NOTE — ED Notes (Signed)
Patient linens changed, bath given, brief change with peri care and skin care done. TLC and warm blankets give.

## 2020-10-16 NOTE — ED Notes (Signed)
10th on ptar list 

## 2020-10-16 NOTE — Discharge Instructions (Addendum)
The head CT and C-spine CT did not show any signs of serious injury.  Laboratory tests did show mild anemia.  Take the antibiotics as prescribed.  Follow-up with your doctor to be rechecked.

## 2020-10-18 LAB — URINE CULTURE: Culture: 7000 — AB

## 2020-10-21 DIAGNOSIS — E039 Hypothyroidism, unspecified: Secondary | ICD-10-CM | POA: Diagnosis not present

## 2020-10-21 DIAGNOSIS — I10 Essential (primary) hypertension: Secondary | ICD-10-CM | POA: Diagnosis not present

## 2020-10-21 DIAGNOSIS — Z89612 Acquired absence of left leg above knee: Secondary | ICD-10-CM | POA: Diagnosis not present

## 2020-10-21 DIAGNOSIS — E1151 Type 2 diabetes mellitus with diabetic peripheral angiopathy without gangrene: Secondary | ICD-10-CM | POA: Diagnosis not present

## 2020-10-21 DIAGNOSIS — Z993 Dependence on wheelchair: Secondary | ICD-10-CM | POA: Diagnosis not present

## 2020-10-21 DIAGNOSIS — E7849 Other hyperlipidemia: Secondary | ICD-10-CM | POA: Diagnosis not present

## 2020-10-22 DIAGNOSIS — R296 Repeated falls: Secondary | ICD-10-CM | POA: Diagnosis not present

## 2020-10-22 DIAGNOSIS — M6281 Muscle weakness (generalized): Secondary | ICD-10-CM | POA: Diagnosis not present

## 2020-10-23 ENCOUNTER — Emergency Department (HOSPITAL_COMMUNITY): Payer: Medicare Other

## 2020-10-23 ENCOUNTER — Other Ambulatory Visit: Payer: Self-pay

## 2020-10-23 ENCOUNTER — Encounter (HOSPITAL_COMMUNITY): Payer: Self-pay | Admitting: Emergency Medicine

## 2020-10-23 ENCOUNTER — Inpatient Hospital Stay (HOSPITAL_COMMUNITY)
Admission: EM | Admit: 2020-10-23 | Discharge: 2020-10-26 | DRG: 689 | Disposition: A | Discharge status: Home / Self Care | Payer: Medicare Other | Attending: Internal Medicine | Admitting: Internal Medicine

## 2020-10-23 DIAGNOSIS — E039 Hypothyroidism, unspecified: Secondary | ICD-10-CM | POA: Diagnosis present

## 2020-10-23 DIAGNOSIS — N390 Urinary tract infection, site not specified: Secondary | ICD-10-CM | POA: Diagnosis not present

## 2020-10-23 DIAGNOSIS — Z1624 Resistance to multiple antibiotics: Secondary | ICD-10-CM | POA: Diagnosis present

## 2020-10-23 DIAGNOSIS — E1169 Type 2 diabetes mellitus with other specified complication: Secondary | ICD-10-CM | POA: Diagnosis not present

## 2020-10-23 DIAGNOSIS — B9620 Unspecified Escherichia coli [E. coli] as the cause of diseases classified elsewhere: Secondary | ICD-10-CM | POA: Diagnosis present

## 2020-10-23 DIAGNOSIS — Z7984 Long term (current) use of oral hypoglycemic drugs: Secondary | ICD-10-CM | POA: Diagnosis not present

## 2020-10-23 DIAGNOSIS — F04 Amnestic disorder due to known physiological condition: Secondary | ICD-10-CM | POA: Diagnosis present

## 2020-10-23 DIAGNOSIS — Z66 Do not resuscitate: Secondary | ICD-10-CM | POA: Diagnosis present

## 2020-10-23 DIAGNOSIS — Z79890 Hormone replacement therapy (postmenopausal): Secondary | ICD-10-CM

## 2020-10-23 DIAGNOSIS — F1011 Alcohol abuse, in remission: Secondary | ICD-10-CM | POA: Diagnosis present

## 2020-10-23 DIAGNOSIS — G3109 Other frontotemporal dementia: Secondary | ICD-10-CM | POA: Diagnosis present

## 2020-10-23 DIAGNOSIS — R1031 Right lower quadrant pain: Secondary | ICD-10-CM | POA: Diagnosis not present

## 2020-10-23 DIAGNOSIS — Z20822 Contact with and (suspected) exposure to covid-19: Secondary | ICD-10-CM | POA: Diagnosis not present

## 2020-10-23 DIAGNOSIS — L89152 Pressure ulcer of sacral region, stage 2: Secondary | ICD-10-CM | POA: Diagnosis not present

## 2020-10-23 DIAGNOSIS — Z7401 Bed confinement status: Secondary | ICD-10-CM | POA: Diagnosis not present

## 2020-10-23 DIAGNOSIS — B9629 Other Escherichia coli [E. coli] as the cause of diseases classified elsewhere: Secondary | ICD-10-CM | POA: Diagnosis not present

## 2020-10-23 DIAGNOSIS — R404 Transient alteration of awareness: Secondary | ICD-10-CM | POA: Diagnosis not present

## 2020-10-23 DIAGNOSIS — G9341 Metabolic encephalopathy: Secondary | ICD-10-CM | POA: Diagnosis not present

## 2020-10-23 DIAGNOSIS — E782 Mixed hyperlipidemia: Secondary | ICD-10-CM | POA: Diagnosis not present

## 2020-10-23 DIAGNOSIS — Z794 Long term (current) use of insulin: Secondary | ICD-10-CM

## 2020-10-23 DIAGNOSIS — Z6841 Body Mass Index (BMI) 40.0 and over, adult: Secondary | ICD-10-CM

## 2020-10-23 DIAGNOSIS — Z89512 Acquired absence of left leg below knee: Secondary | ICD-10-CM

## 2020-10-23 DIAGNOSIS — F0281 Dementia in other diseases classified elsewhere with behavioral disturbance: Secondary | ICD-10-CM | POA: Diagnosis present

## 2020-10-23 DIAGNOSIS — R4020 Unspecified coma: Secondary | ICD-10-CM | POA: Diagnosis not present

## 2020-10-23 DIAGNOSIS — Z79899 Other long term (current) drug therapy: Secondary | ICD-10-CM | POA: Diagnosis not present

## 2020-10-23 DIAGNOSIS — R0902 Hypoxemia: Secondary | ICD-10-CM | POA: Diagnosis not present

## 2020-10-23 DIAGNOSIS — Z87891 Personal history of nicotine dependence: Secondary | ICD-10-CM

## 2020-10-23 DIAGNOSIS — I10 Essential (primary) hypertension: Secondary | ICD-10-CM | POA: Diagnosis not present

## 2020-10-23 DIAGNOSIS — R6889 Other general symptoms and signs: Secondary | ICD-10-CM | POA: Diagnosis not present

## 2020-10-23 DIAGNOSIS — R319 Hematuria, unspecified: Secondary | ICD-10-CM

## 2020-10-23 DIAGNOSIS — Z1612 Extended spectrum beta lactamase (ESBL) resistance: Secondary | ICD-10-CM | POA: Diagnosis not present

## 2020-10-23 DIAGNOSIS — L8990 Pressure ulcer of unspecified site, unspecified stage: Secondary | ICD-10-CM | POA: Diagnosis not present

## 2020-10-23 DIAGNOSIS — I499 Cardiac arrhythmia, unspecified: Secondary | ICD-10-CM | POA: Diagnosis not present

## 2020-10-23 DIAGNOSIS — Z743 Need for continuous supervision: Secondary | ICD-10-CM | POA: Diagnosis not present

## 2020-10-23 LAB — COMPREHENSIVE METABOLIC PANEL
ALT: 16 U/L (ref 0–44)
AST: 17 U/L (ref 15–41)
Albumin: 3.1 g/dL — ABNORMAL LOW (ref 3.5–5.0)
Alkaline Phosphatase: 55 U/L (ref 38–126)
Anion gap: 10 (ref 5–15)
BUN: 28 mg/dL — ABNORMAL HIGH (ref 8–23)
CO2: 24 mmol/L (ref 22–32)
Calcium: 10.3 mg/dL (ref 8.9–10.3)
Chloride: 111 mmol/L (ref 98–111)
Creatinine, Ser: 0.62 mg/dL (ref 0.44–1.00)
GFR, Estimated: >60 mL/min (ref >60)
Glucose, Bld: 123 mg/dL — ABNORMAL HIGH (ref 70–99)
Potassium: 4.3 mmol/L (ref 3.5–5.1)
Sodium: 145 mmol/L (ref 135–145)
Total Bilirubin: 0.7 mg/dL (ref 0.3–1.2)
Total Protein: 7 g/dL (ref 6.5–8.1)

## 2020-10-23 LAB — URINALYSIS, ROUTINE W REFLEX MICROSCOPIC
Bilirubin Urine: NEGATIVE
Glucose, UA: NEGATIVE mg/dL
Nitrite: POSITIVE — AB
Protein, ur: 30 mg/dL — AB
RBC / HPF: >50 RBC/hpf — ABNORMAL HIGH (ref 0–5)
Specific Gravity, Urine: >=1.03 (ref 1.005–1.030)
WBC, UA: >50 WBC/hpf — ABNORMAL HIGH (ref 0–5)
pH: 6 (ref 5.0–8.0)

## 2020-10-23 LAB — CBC
HCT: 32.2 % — ABNORMAL LOW (ref 36.0–46.0)
Hemoglobin: 10.2 g/dL — ABNORMAL LOW (ref 12.0–15.0)
MCH: 27.3 pg (ref 26.0–34.0)
MCHC: 31.7 g/dL (ref 30.0–36.0)
MCV: 86.3 fL (ref 80.0–100.0)
Platelets: 308 10*3/uL (ref 150–400)
RBC: 3.73 MIL/uL — ABNORMAL LOW (ref 3.87–5.11)
RDW: 13.1 % (ref 11.5–15.5)
WBC: 12.5 10*3/uL — ABNORMAL HIGH (ref 4.0–10.5)
nRBC: 0 % (ref 0.0–0.2)

## 2020-10-23 LAB — RESP PANEL BY RT-PCR (FLU A&B, COVID) ARPGX2
Influenza A by PCR: NEGATIVE
Influenza B by PCR: NEGATIVE
SARS Coronavirus 2 by RT PCR: NEGATIVE

## 2020-10-23 MED ORDER — SODIUM CHLORIDE 0.9 % IV SOLN
1.0000 g | Freq: Once | INTRAVENOUS | Status: AC
  Administered 2020-10-23: 1 g via INTRAVENOUS
  Filled 2020-10-23: qty 1

## 2020-10-23 MED ORDER — IOHEXOL 350 MG/ML SOLN
80.0000 mL | Freq: Once | INTRAVENOUS | Status: AC | PRN
  Administered 2020-10-23: 80 mL via INTRAVENOUS

## 2020-10-23 MED ORDER — KETOROLAC TROMETHAMINE 15 MG/ML IJ SOLN
15.0000 mg | Freq: Once | INTRAMUSCULAR | Status: AC
  Administered 2020-10-23: 15 mg via INTRAVENOUS
  Filled 2020-10-23: qty 1

## 2020-10-23 NOTE — ED Provider Notes (Signed)
[facility]-EMERGENCY DEPT Provider Note   CSN: [id number] Arrival date & time: 10/23/20  1719     History No chief complaint on file.   Alexis Becker is a 70 y.o. female with PMH dementia, T2DM, currently living in a memory care facility who presents to the emergency department for evaluation of altered mental status.  Per EMS, patient recently finished a course of Keflex for urinary tract infection.  No current falls or external signs of trauma.  Additional history unable to be obtained as the patient is currently altered.  On initial presentation, patient somnolent but responds to loud voice.  Does not follow commands but does attempt to answer questions.   HPI     Past Medical History:  Diagnosis Date   Dementia (HCC)    Diabetes mellitus without complication (HCC)    Thyroid disease     Patient Active Problem List   Diagnosis Date Noted   Thrombocytopenia (HCC) 01/24/2020    No past surgical history on file.   OB History   No obstetric history on file.     No family history on file.  Social History   Tobacco Use   Smoking status: Unknown    Home Medications Prior to Admission medications   Medication Sig Start Date End Date Taking? Authorizing Provider  acetaminophen (TYLENOL) 500 MG tablet Take 1,000 mg by mouth 2 (two) times daily as needed for fever or mild pain.    [provider]  busPIRone (BUSPAR) 7.5 MG tablet Take 7.5 mg by mouth 2 (two) times daily.    [provider]  calcium carbonate (OS-CAL) 600 MG TABS tablet Take 600 mg by mouth 2 (two) times daily.    [provider]  citalopram (CELEXA) 20 MG tablet Take 20 mg by mouth daily.    [provider]  clonazePAM (KLONOPIN) 0.5 MG tablet Take 0.5 mg by mouth 3 (three) times daily. 01/02/20   [provider]  Docusate Sodium 100 MG capsule Take 100 mg by mouth 2 (two) times daily.    [provider]  guaiFENesin (ROBITUSSIN)  100 MG/5ML liquid Take 200 mg by mouth every 4 (four) hours as needed for cough or congestion.    [provider]  LANTUS SOLOSTAR 100 UNIT/ML Solostar Pen Inject 14 Units into the skin every morning. Hold for sugar < 110; prime with 2 units prior to sue - use within 28 days once opened and use with safety pen needles. 01/08/20   [provider]  levothyroxine (SYNTHROID) 50 MCG tablet Take 50 mcg by mouth daily. 01/21/20   [provider]  lisinopril (ZESTRIL) 2.5 MG tablet Take 2.5 mg by mouth daily. 01/21/20   [provider]  loperamide (IMODIUM A-D) 2 MG tablet Take 2 mg by mouth 3 (three) times daily as needed for diarrhea or loose stools.    [provider]  melatonin 3 MG TABS tablet Take 3 mg by mouth at bedtime. 03/26/18   [provider]  Menthol, Topical Analgesic, (PAIN RELIEVING PATCH ULTRA ST EX) Apply 1 patch topically daily as needed (pain; apply to left stump).    [provider]  metFORMIN (GLUCOPHAGE) 1000 MG tablet Take 1,000 mg by mouth 2 (two) times daily. 01/21/20   [provider]  Multiple Vitamins-Minerals (HEALTHY EYES PO) Take 1 capsule by mouth daily.    [provider]  [facility] powder Apply 1 application topically 2 (two) times daily as needed (rash). 10/12/19  [provider]  pioglitazone (ACTOS) 15 MG tablet Take 15 mg by mouth daily. 01/21/20   [provider]  polyethylene glycol powder (GLYCOLAX/MIRALAX) 17 GM/SCOOP powder Take 17 g by mouth daily as needed for mild constipation (constipation).    [provider]  QUEtiapine (SEROQUEL) 50 MG tablet Take 50 mg by mouth See admin instructions. 8 am and 2 pm    [provider]  simvastatin (ZOCOR) 20 MG tablet Take 20 mg by mouth every evening. 01/21/20   [provider]  sodium chloride 1 g tablet Take 1 g by mouth 2 (two) times daily.    [provider]  thiamine 100 MG tablet Take 100 mg  by mouth daily. 10/16/17   [provider]  traZODone (DESYREL) 50 MG tablet Take 50 mg by mouth 2 (two) times daily as needed (anxiety/depression).    [provider]  vitamin B-12 (CYANOCOBALAMIN) 500 MCG tablet Take 500 mcg by mouth daily.    [provider]  zinc oxide 20 % ointment Apply 1 application topically 3 (three) times daily as needed (apply to buttocks for redness).    [provider]    Allergies    Patient has no known allergies.  Review of Systems   Review of Systems  Unable to perform ROS: Mental status change   Physical Exam Updated Vital Signs There were no vitals taken for this visit.  Physical Exam Vitals and nursing note reviewed.  Constitutional:      General: She is not in acute distress.    Appearance: She is well-developed.  HENT:     Head: Normocephalic and atraumatic.  Eyes:     Conjunctiva/sclera: Conjunctivae normal.  Cardiovascular:     Rate and Rhythm: Normal rate and regular rhythm.     Heart sounds: No murmur heard. Pulmonary:     Effort: Pulmonary effort is normal. No respiratory distress.     Breath sounds: Normal breath sounds.  Abdominal:     Palpations: Abdomen is soft.     Tenderness: There is abdominal tenderness (Right lower quadrant).  Musculoskeletal:     Cervical back: Neck supple.  Skin:    General: Skin is warm and dry.  Neurological:     Mental Status: She is alert.    ED Results / Procedures / Treatments   Labs (all labs ordered are listed, but only abnormal results are displayed) Labs Reviewed - No data to display  EKG None  Radiology No results found.  Procedures Procedures   Medications Ordered in ED Medications - No data to display  ED Course  I have reviewed the triage vital signs and the nursing notes.  Pertinent labs & imaging results that were available during my care of the patient were reviewed by me and considered in my medical decision making (see chart for  details).    MDM Rules/Calculators/A&P                           Patient seen the emergency department for evaluation of altered mental status.  Physical exam reveals tenderness in the right lower quadrant as well as a disoriented patient.  Laboratory evaluation reveals a leukocytosis to 12.5, hemoglobin 10.2, chemistry unremarkable, urinalysis with large blood and greater than 50 white blood cells, and positive nitrites, small leuk esterase.  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  Due to persistent altered mental status and ESBL UTI, patient will require medical admission.  Patient then admitted.  CT abdomen pelvis is currently pending and I will follow up on any surgical pathology. Final Clinical Impression(s) / ED Diagnoses Final diagnoses:  None    Rx / DC Orders ED Discharge Orders     None        Negan Grudzien, Wyn Forster, MD 10/23/20 2123

## 2020-10-23 NOTE — ED Notes (Signed)
In and out cath attempted x 4 by Victorino Dike, RN and others assisting. Pt noted to be difficult to keep still and blood clots present upon attempt to obtain urine sample. Kommor, MD made aware per Victorino Dike, RN

## 2020-10-23 NOTE — Progress Notes (Signed)
A consult was received from an ED physician for meropenem per pharmacy dosing.  The patient's profile has been reviewed for ht/wt/allergies/indication/available labs.   A one time order has been placed for Meropenem 1g IV.  Further antibiotics/pharmacy consults should be ordered by admitting physician if indicated.                       Thank you,  Lynann Beaver PharmD, BCPS Clinical Pharmacist [facility] main pharmacy [phone] 10/23/2020 9:11 PM

## 2020-10-23 NOTE — ED Notes (Signed)
Attempted I&O cath x 3 without success.  Pt noted to have moderate vaginal bleeding, MD made aware.

## 2020-10-23 NOTE — ED Notes (Signed)
Patient transported to CT 

## 2020-10-23 NOTE — ED Triage Notes (Signed)
Pt to ER via EMS from [facility] with c/o AMS.  Per EMS staff and family state that pt is not her normal level of alertness.  Pt responds to loud voice.  Does not follow commands, attempts to answer questions.

## 2020-10-24 ENCOUNTER — Encounter (HOSPITAL_COMMUNITY): Payer: Self-pay | Admitting: Internal Medicine

## 2020-10-24 DIAGNOSIS — F1011 Alcohol abuse, in remission: Secondary | ICD-10-CM | POA: Diagnosis present

## 2020-10-24 DIAGNOSIS — B9629 Other Escherichia coli [E. coli] as the cause of diseases classified elsewhere: Secondary | ICD-10-CM | POA: Diagnosis present

## 2020-10-24 DIAGNOSIS — I10 Essential (primary) hypertension: Secondary | ICD-10-CM | POA: Diagnosis present

## 2020-10-24 DIAGNOSIS — E039 Hypothyroidism, unspecified: Secondary | ICD-10-CM | POA: Diagnosis present

## 2020-10-24 DIAGNOSIS — E1169 Type 2 diabetes mellitus with other specified complication: Secondary | ICD-10-CM | POA: Diagnosis present

## 2020-10-24 DIAGNOSIS — F04 Amnestic disorder due to known physiological condition: Secondary | ICD-10-CM | POA: Diagnosis present

## 2020-10-24 DIAGNOSIS — E782 Mixed hyperlipidemia: Secondary | ICD-10-CM | POA: Diagnosis present

## 2020-10-24 DIAGNOSIS — Z89512 Acquired absence of left leg below knee: Secondary | ICD-10-CM

## 2020-10-24 DIAGNOSIS — F0281 Dementia in other diseases classified elsewhere with behavioral disturbance: Secondary | ICD-10-CM | POA: Diagnosis present

## 2020-10-24 DIAGNOSIS — N390 Urinary tract infection, site not specified: Secondary | ICD-10-CM | POA: Diagnosis present

## 2020-10-24 DIAGNOSIS — G3109 Other frontotemporal dementia: Secondary | ICD-10-CM | POA: Diagnosis present

## 2020-10-24 HISTORY — DX: Acquired absence of left leg below knee: Z89.512

## 2020-10-24 LAB — CBC WITH DIFFERENTIAL/PLATELET
Abs Immature Granulocytes: 0.1 10*3/uL — ABNORMAL HIGH (ref 0.00–0.07)
Basophils Absolute: 0 10*3/uL (ref 0.0–0.1)
Basophils Relative: 0 %
Eosinophils Absolute: 0.2 10*3/uL (ref 0.0–0.5)
Eosinophils Relative: 1 %
HCT: 33.4 % — ABNORMAL LOW (ref 36.0–46.0)
Hemoglobin: 10.7 g/dL — ABNORMAL LOW (ref 12.0–15.0)
Immature Granulocytes: 1 %
Lymphocytes Relative: 15 %
Lymphs Abs: 1.8 10*3/uL (ref 0.7–4.0)
MCH: 27.7 pg (ref 26.0–34.0)
MCHC: 32 g/dL (ref 30.0–36.0)
MCV: 86.5 fL (ref 80.0–100.0)
Monocytes Absolute: 0.6 10*3/uL (ref 0.1–1.0)
Monocytes Relative: 5 %
Neutro Abs: 9.3 10*3/uL — ABNORMAL HIGH (ref 1.7–7.7)
Neutrophils Relative %: 78 %
Platelets: 319 10*3/uL (ref 150–400)
RBC: 3.86 MIL/uL — ABNORMAL LOW (ref 3.87–5.11)
RDW: 13.1 % (ref 11.5–15.5)
WBC: 12.1 10*3/uL — ABNORMAL HIGH (ref 4.0–10.5)
nRBC: 0 % (ref 0.0–0.2)

## 2020-10-24 LAB — COMPREHENSIVE METABOLIC PANEL
ALT: 16 U/L (ref 0–44)
AST: 13 U/L — ABNORMAL LOW (ref 15–41)
Albumin: 3 g/dL — ABNORMAL LOW (ref 3.5–5.0)
Alkaline Phosphatase: 56 U/L (ref 38–126)
Anion gap: 9 (ref 5–15)
BUN: 26 mg/dL — ABNORMAL HIGH (ref 8–23)
CO2: 25 mmol/L (ref 22–32)
Calcium: 9.6 mg/dL (ref 8.9–10.3)
Chloride: 106 mmol/L (ref 98–111)
Creatinine, Ser: 0.7 mg/dL (ref 0.44–1.00)
GFR, Estimated: >60 mL/min (ref >60)
Glucose, Bld: 131 mg/dL — ABNORMAL HIGH (ref 70–99)
Potassium: 3.9 mmol/L (ref 3.5–5.1)
Sodium: 140 mmol/L (ref 135–145)
Total Bilirubin: 0.6 mg/dL (ref 0.3–1.2)
Total Protein: 6.7 g/dL (ref 6.5–8.1)

## 2020-10-24 LAB — GLUCOSE, CAPILLARY
Glucose-Capillary: 94 mg/dL (ref 70–99)
Glucose-Capillary: 74 mg/dL (ref 70–99)
Glucose-Capillary: 93 mg/dL (ref 70–99)
Glucose-Capillary: 175 mg/dL — ABNORMAL HIGH (ref 70–99)
Glucose-Capillary: 235 mg/dL — ABNORMAL HIGH (ref 70–99)

## 2020-10-24 LAB — BLOOD GAS, ARTERIAL
Acid-base deficit: 0.8 mmol/L (ref 0.0–2.0)
Bicarbonate: 24 mmol/L (ref 20.0–28.0)
FIO2: 21
O2 Saturation: 93.5 %
Patient temperature: 98.6
pCO2 arterial: 43.1 mmHg (ref 32.0–48.0)
pH, Arterial: 7.364 (ref 7.350–7.450)
pO2, Arterial: 76 mmHg — ABNORMAL LOW (ref 83.0–108.0)

## 2020-10-24 LAB — MAGNESIUM: Magnesium: 1.6 mg/dL — ABNORMAL LOW (ref 1.7–2.4)

## 2020-10-24 LAB — HEMOGLOBIN A1C
Hgb A1c MFr Bld: 6.1 % — ABNORMAL HIGH (ref 4.8–5.6)
Mean Plasma Glucose: 128.37 mg/dL

## 2020-10-24 LAB — HIV ANTIBODY (ROUTINE TESTING W REFLEX): HIV Screen 4th Generation wRfx: NONREACTIVE

## 2020-10-24 LAB — AMMONIA: Ammonia: 21 umol/L (ref 9–35)

## 2020-10-24 LAB — VITAMIN B12: Vitamin B-12: 1021 pg/mL — ABNORMAL HIGH (ref 180–914)

## 2020-10-24 LAB — MRSA NEXT GEN BY PCR, NASAL: MRSA by PCR Next Gen: NOT DETECTED

## 2020-10-24 LAB — TSH: TSH: 0.936 u[IU]/mL (ref 0.350–4.500)

## 2020-10-24 LAB — FOLATE: Folate: 11.2 ng/mL (ref >5.9)

## 2020-10-24 MED ORDER — INSULIN GLARGINE 100 UNIT/ML SOLOSTAR PEN: 14.0000 [IU] | PEN_INJECTOR | SUBCUTANEOUS | Status: DC

## 2020-10-24 MED ORDER — KCL-LACTATED RINGERS 20 MEQ/L IV SOLN
INTRAVENOUS | Status: DC
  Filled 2020-10-24 (×2): qty 1000

## 2020-10-24 MED ORDER — INSULIN ASPART 100 UNIT/ML IJ SOLN
0.0000 [IU] | Freq: Three times a day (TID) | INTRAMUSCULAR | Status: DC
  Administered 2020-10-24: 3 [IU] via SUBCUTANEOUS
  Administered 2020-10-24: 5 [IU] via SUBCUTANEOUS
  Administered 2020-10-25: 2 [IU] via SUBCUTANEOUS
  Administered 2020-10-25 (×2): 3 [IU] via SUBCUTANEOUS
  Administered 2020-10-26 (×2): 2 [IU] via SUBCUTANEOUS
  Administered 2020-10-26: 8 [IU] via SUBCUTANEOUS

## 2020-10-24 MED ORDER — LISINOPRIL 5 MG PO TABS: 2.5000 mg | ORAL_TABLET | Freq: Every morning | ORAL | Status: DC

## 2020-10-24 MED ORDER — ACETAMINOPHEN 325 MG PO TABS
650.0000 mg | ORAL_TABLET | Freq: Four times a day (QID) | ORAL | Status: DC | PRN
  Administered 2020-10-24: 650 mg via ORAL
  Filled 2020-10-24: qty 2

## 2020-10-24 MED ORDER — MAGNESIUM SULFATE 2 GM/50ML IV SOLN
2.0000 g | Freq: Once | INTRAVENOUS | Status: AC
  Administered 2020-10-24: 2 g via INTRAVENOUS
  Filled 2020-10-24: qty 50

## 2020-10-24 MED ORDER — SIMVASTATIN 10 MG PO TABS
20.0000 mg | ORAL_TABLET | Freq: Every day | ORAL | Status: DC
  Administered 2020-10-24 – 2020-10-26 (×3): 20 mg via ORAL
  Filled 2020-10-24 (×3): qty 2

## 2020-10-24 MED ORDER — LACTATED RINGERS IV BOLUS
500.0000 mL | Freq: Once | INTRAVENOUS | Status: AC
  Administered 2020-10-24: 500 mL via INTRAVENOUS

## 2020-10-24 MED ORDER — SODIUM CHLORIDE 0.9 % IV SOLN
1.0000 g | Freq: Three times a day (TID) | INTRAVENOUS | Status: DC
  Administered 2020-10-24 – 2020-10-26 (×7): 1 g via INTRAVENOUS
  Filled 2020-10-24 (×9): qty 1

## 2020-10-24 MED ORDER — LEVOTHYROXINE SODIUM 50 MCG PO TABS: 50.0000 ug | ORAL_TABLET | Freq: Every day | ORAL | Status: DC

## 2020-10-24 MED ORDER — INSULIN GLARGINE-YFGN 100 UNIT/ML ~~LOC~~ SOLN
7.0000 [IU] | Freq: Every day | SUBCUTANEOUS | Status: DC
  Administered 2020-10-25 – 2020-10-26 (×2): 7 [IU] via SUBCUTANEOUS
  Filled 2020-10-24 (×3): qty 0.07

## 2020-10-24 MED ORDER — ENOXAPARIN SODIUM 40 MG/0.4ML IJ SOSY
40.0000 mg | PREFILLED_SYRINGE | INTRAMUSCULAR | Status: DC
  Administered 2020-10-24 – 2020-10-26 (×3): 40 mg via SUBCUTANEOUS
  Filled 2020-10-24 (×3): qty 0.4

## 2020-10-24 MED ORDER — CYANOCOBALAMIN 500 MCG PO TABS: 500.0000 ug | ORAL_TABLET | Freq: Every morning | ORAL | Status: DC

## 2020-10-24 MED ORDER — POLYETHYLENE GLYCOL 3350 17 G PO PACK: 17.0000 g | PACK | Freq: Every day | ORAL | Status: DC | PRN

## 2020-10-24 MED ORDER — CITALOPRAM HYDROBROMIDE 20 MG PO TABS: 20.0000 mg | ORAL_TABLET | Freq: Every morning | ORAL | Status: DC

## 2020-10-24 MED ORDER — ONDANSETRON HCL 4 MG/2ML IJ SOLN: 4.0000 mg | Freq: Four times a day (QID) | INTRAMUSCULAR | Status: DC | PRN

## 2020-10-24 MED ORDER — POTASSIUM CHLORIDE 2 MEQ/ML IV SOLN
INTRAVENOUS | Status: DC
  Filled 2020-10-24 (×4): qty 1000

## 2020-10-24 MED ORDER — ACETAMINOPHEN 650 MG RE SUPP: 650.0000 mg | Freq: Four times a day (QID) | RECTAL | Status: DC | PRN

## 2020-10-24 MED ORDER — DOCUSATE SODIUM 100 MG PO CAPS: 100.0000 mg | ORAL_CAPSULE | Freq: Two times a day (BID) | ORAL | Status: DC

## 2020-10-24 MED ORDER — LEVOTHYROXINE SODIUM 50 MCG PO TABS
50.0000 ug | ORAL_TABLET | Freq: Every day | ORAL | Status: DC
  Administered 2020-10-25 – 2020-10-26 (×2): 50 ug via ORAL
  Filled 2020-10-24 (×2): qty 1

## 2020-10-24 MED ORDER — QUETIAPINE FUMARATE 25 MG PO TABS: 50.0000 mg | ORAL_TABLET | ORAL | Status: DC

## 2020-10-24 MED ORDER — THIAMINE HCL 100 MG PO TABS: 100.0000 mg | ORAL_TABLET | Freq: Every morning | ORAL | Status: DC

## 2020-10-24 MED ORDER — SIMVASTATIN 10 MG PO TABS: 20.0000 mg | ORAL_TABLET | Freq: Every day | ORAL | Status: DC

## 2020-10-24 MED ORDER — TRAZODONE HCL 50 MG PO TABS: 50.0000 mg | ORAL_TABLET | Freq: Two times a day (BID) | ORAL | Status: DC | PRN

## 2020-10-24 MED ORDER — LISINOPRIL 5 MG PO TABS
2.5000 mg | ORAL_TABLET | Freq: Every morning | ORAL | Status: DC
  Administered 2020-10-25 – 2020-10-26 (×2): 2.5 mg via ORAL
  Filled 2020-10-24 (×2): qty 1

## 2020-10-24 MED ORDER — HYDRALAZINE HCL 20 MG/ML IJ SOLN: 10.0000 mg | Freq: Four times a day (QID) | INTRAMUSCULAR | Status: DC | PRN

## 2020-10-24 MED ORDER — BUSPIRONE HCL 5 MG PO TABS: 7.5000 mg | ORAL_TABLET | Freq: Two times a day (BID) | ORAL | Status: DC

## 2020-10-24 MED ORDER — ONDANSETRON HCL 4 MG PO TABS: 4.0000 mg | ORAL_TABLET | Freq: Four times a day (QID) | ORAL | Status: DC | PRN

## 2020-10-24 NOTE — H&P (Signed)
History and Physical    Alexis Becker ZDG:[id number] DOB: March 01, 1950 DOA: 10/23/2020  PCP: Ashley Royalty, NP  Patient coming from: [facility]   Chief Complaint:  Chief Complaint  Patient presents with   Altered Mental Status     HPI:    70 year old female with past medical history of frontotemporal dementia, Warnicke Korsakoff syndrome, hypertension, hyperlipidemia, hypothyroidism, insulin-dependent diabetes mellitus type 2, left BKA (S/P acute limb ischemia) and remote history of alcohol abuse currently in remission who presents to [facility] emergency department due to progressively worsening lethargy and confusion.  Patient is an extremely poor historian due to advanced dementia with superimposed encephalopathy.  Majority the history has been via discussion with emergency department staff, review of triage notes and discussion with the sister who is power of attorney via phone conversation.  On 9/2 the patient experienced a fall while at her facility.  Patient was brought in to the emergency department at that time for evaluation.  Urinalysis was suggestive of a urinary tract infection which was felt to possibly be a cause of the fall and therefore patient was sent back to [facility] on a course of oral Keflex.  In the days that followed, despite the patient taking this regimen of Keflex the patient became more and more lethargic day by day.  This was associated with worsening oral intake and worsening confusion beyond her baseline.  The sister explains that when she went to see her sister around noon on 9/10 she found her sister "slumped over in her chair" prompting the patient to be brought into [facility] emergency department for evaluation via EMS.  Upon further questioning of the sister, she reports that the patient has had several UTIs over the summer and "never seemed to get over them."  Upon evaluation in the emergency department.   Urinalysis was performed and found to be suggestive of a urinary tract infection.  When urine culture from 9/2 was reviewed, it turns out patient has multidrug-resistant E. coli only sensitive to Zosyn and carbapenems, suggesting that the Keflex patient was originally sent home on on 9/2 was certainly ineffective.  Patient was noted to have a somewhat tender abdomen so CT imaging of the abdomen pelvis was performed which revealed no evidence of stone or pyelonephritis.  Patient was administered intravenous meropenem.  The hospitalist group was then called to assess the patient for admission to the hospital.  Review of Systems:   Review of Systems  Unable to perform ROS: Medical condition   Past Medical History:  Diagnosis Date   Dementia (HCC)    Diabetes mellitus without complication (HCC)    Hx of BKA, left (HCC) 10/24/2020   Thyroid disease     History reviewed. No pertinent surgical history.   reports that she quit smoking about 3 years ago. Her smoking use included cigarettes. She started smoking about 42 years ago. She has a 40.00 pack-year smoking history. She has never used smokeless tobacco. No history on file for alcohol use and drug use.  No Known Allergies  Family History  Problem Relation Age of Onset   Heart disease Neg Hx      Prior to Admission medications   Medication Sig Start Date End Date Taking? Authorizing Provider  acetaminophen (TYLENOL) 500 MG tablet Take 1,000 mg by mouth 2 (two) times daily as needed for fever (pain).   Yes [provider]  busPIRone (BUSPAR) 7.5 MG tablet Take 7.5 mg by mouth 2 (two) times  daily.   Yes [provider]  calcium carbonate (OS-CAL) 600 MG TABS tablet Take 600 mg by mouth 2 (two) times daily.   Yes [provider]  Camphor-Menthol-Methyl Sal (SALONPAS) 3.02-19-08 % PTCH Place 1 patch onto the skin See admin instructions. Apply one patch transdermally to left aka stump daily as needed for pain   Yes  [provider]  cephALEXin (KEFLEX) 500 MG capsule Take 500 mg by mouth 3 (three) times daily.   Yes [provider]  citalopram (CELEXA) 20 MG tablet Take 20 mg by mouth every morning.   Yes [provider]  clonazePAM (KLONOPIN) 0.5 MG tablet Take 0.5 mg by mouth 3 (three) times daily. 01/02/20  Yes [provider]  Docusate Sodium 100 MG capsule Take 100 mg by mouth 2 (two) times daily.   Yes [provider]  guaiFENesin (ROBITUSSIN) 100 MG/5ML liquid Take 200 mg by mouth every 4 (four) hours as needed for cough or congestion.   Yes [provider]  insulin glargine (LANTUS SOLOSTAR) 100 UNIT/ML Solostar Pen Inject 14 Units into the skin See admin instructions. Inject 14 units subcutaneously every morning - hold for CBG <110   Yes [provider]  levothyroxine (SYNTHROID) 50 MCG tablet Take 50 mcg by mouth daily before breakfast. 01/21/20  Yes [provider]  lisinopril (ZESTRIL) 2.5 MG tablet Take 2.5 mg by mouth every morning. 01/21/20  Yes [provider]  loperamide (IMODIUM A-D) 2 MG tablet Take 2 mg by mouth 3 (three) times daily as needed for diarrhea or loose stools.   Yes [provider]  melatonin 3 MG TABS tablet Take 3 mg by mouth at bedtime. 03/26/18  Yes [provider]  metFORMIN (GLUCOPHAGE) 1000 MG tablet Take 1,000 mg by mouth 2 (two) times daily. 01/21/20  Yes [provider]  Multiple Vitamins-Minerals (HEALTHY EYES/LUTEIN-ZEAXANTHIN) CAPS Take 1 capsule by mouth every morning.   Yes [provider]  [facility] powder Apply 1 application topically 2 (two) times daily as needed (groin rash). 10/12/19  Yes [provider]  pioglitazone (ACTOS) 15 MG tablet Take 15 mg by mouth every morning. 01/21/20  Yes [provider]  polyethylene glycol powder (GLYCOLAX/MIRALAX) 17 GM/SCOOP powder Take 17 g by mouth daily as needed (constipation).   Yes [provider]  QUEtiapine (SEROQUEL) 50 MG tablet Take 50 mg by mouth See admin instructions. Take one tablet (50 mg) by mouth twice daily - 8am and 2pm   Yes [provider]  simvastatin (ZOCOR) 20 MG tablet Take 20 mg by mouth at bedtime. 01/21/20  Yes [provider]  sodium chloride 1 g tablet Take 1 g by mouth 2 (two) times daily.   Yes [provider]  thiamine 100 MG tablet Take 100 mg by mouth every morning. Vitamin B1 10/16/17  Yes [provider]  traZODone (DESYREL) 50 MG tablet Take 50 mg by mouth 2 (two) times daily as needed (anxiety/depression).   Yes [provider]  vitamin B-12 (CYANOCOBALAMIN) 500 MCG tablet Take 500 mcg by mouth every morning.   Yes [provider]  zinc oxide 20 % ointment Apply 1 application topically 3 (three) times daily as needed (apply to buttocks for redness).   Yes [provider]    Physical Exam: Vitals:   10/23/20 1900 10/23/20 2021 10/23/20 2121 10/23/20 2359  BP: (!) 128/103 (!) 151/79 (!) 156/85 131/76  Pulse: (!) 105 87 87 87  Resp: 16 (!)  21 17 14   Temp:    97.9 F (36.6 C)  TempSrc:      SpO2: 99% 99% 95% 96%  Weight:      Height:        Constitutional: Patient is extremely lethargic and minimally arousable only to sternal rub. Skin: no rashes, no lesions, poor skin turgor noted.   Eyes: Pupils are equally reactive to light.  No evidence of scleral icterus or conjunctival pallor.  ENMT: Dry mucous membranes noted.  Posterior pharynx clear of any exudate or lesions.   Neck: normal, supple, no masses, no thyromegaly.  No evidence of jugular venous distension.   Respiratory: clear to auscultation bilaterally, no wheezing, no crackles. Normal respiratory effort. No accessory muscle use.  Cardiovascular: Regular rate and rhythm, notable systolic murmur, no extremity edema. 2+ pedal pulses. No carotid bruits.  Chest:   Nontender without crepitus or deformity.   Back:    Nontender without crepitus or deformity. Abdomen: Notable generalized abdominal tenderness.  Abdomen is soft however.  No obvious evidence of intra-abdominal masses.  Positive bowel sounds noted.  Musculoskeletal: Evidence of left BKA.  Otherwise, no other joint deformities.  No contractures noted.  Normal muscle tone.   Neurologic: Patient is extremely lethargic and minimally arousable only to painful stimuli.  Patient is not following commands.  Patient does localize to pain.   Psychiatric: Unable to assess due to severe lethargy.  Patient does not possess insight as to her current situation.   Labs on Admission: I have personally reviewed following labs and imaging studies -   CBC: Recent Labs  Lab 10/23/20 1813  WBC 12.5*  HGB 10.2*  HCT 32.2*  MCV 86.3  PLT 308   Basic Metabolic Panel: Recent Labs  Lab 10/23/20 1813  NA 145  K 4.3  CL 111  CO2 24  GLUCOSE 123*  BUN 28*  CREATININE 0.62  CALCIUM 10.3   GFR: Estimated Creatinine Clearance: 84.4 mL/min (by C-G formula based on SCr of 0.62 mg/dL). Liver Function Tests: Recent Labs  Lab 10/23/20 1813  AST 17  ALT 16  ALKPHOS 55  BILITOT 0.7  PROT 7.0  ALBUMIN 3.1*   No results for input(s): LIPASE, AMYLASE in the last 168 hours. Recent Labs  Lab 10/24/20 0040  AMMONIA 21   Coagulation Profile: No results for input(s): INR, PROTIME in the last 168 hours. Cardiac Enzymes: No results for input(s): CKTOTAL, CKMB, CKMBINDEX, TROPONINI in the last 168 hours. BNP (last 3 results) No results for input(s): PROBNP in the last 8760 hours. HbA1C: No results for input(s): HGBA1C in the last 72 hours. CBG: No results for input(s): GLUCAP in the last 168 hours. Lipid Profile: No results for input(s): CHOL, HDL, LDLCALC, TRIG, CHOLHDL, LDLDIRECT in the last 72 hours. Thyroid Function Tests: Recent Labs    10/24/20 0040  TSH 0.936   Anemia Panel: Recent Labs    10/24/20 0040  VITAMINB12 1,021*  FOLATE 11.2    Urine analysis:    Component Value Date/Time   COLORURINE YELLOW (A) 10/23/2020 1727   APPEARANCEUR HAZY (A) 10/23/2020 1727   LABSPEC >=1.030 10/23/2020 1727   PHURINE 6.0 10/23/2020 1727   GLUCOSEU NEGATIVE 10/23/2020 1727   HGBUR LARGE (A) 10/23/2020 1727   BILIRUBINUR NEGATIVE 10/23/2020 1727   KETONESUR TRACE (A) 10/23/2020 1727   PROTEINUR 30 (A) 10/23/2020 1727   NITRITE POSITIVE (A) 10/23/2020 1727   LEUKOCYTESUR SMALL (A) 10/23/2020 1727    Radiological Exams on Admission - Personally  Reviewed: CT ABDOMEN PELVIS W CONTRAST  Result Date: 10/23/2020 CLINICAL DATA:  Right lower quadrant pain EXAM: CT ABDOMEN AND PELVIS WITH CONTRAST TECHNIQUE: Multidetector CT imaging of the abdomen and pelvis was performed using the standard protocol following bolus administration of intravenous contrast. CONTRAST:  80mL OMNIPAQUE IOHEXOL 350 MG/ML SOLN COMPARISON:  None. FINDINGS: Lower chest: Lung bases demonstrate mild sub pleural reticulation. No acute consolidation or effusion. Coronary vascular calcification. Hepatobiliary: No focal liver abnormality is seen. No gallstones, gallbladder wall thickening, or biliary dilatation. Pancreas: Unremarkable. No pancreatic ductal dilatation or surrounding inflammatory changes. Spleen: Normal in size without focal abnormality. Adrenals/Urinary Tract: Adrenal glands are normal. Kidneys show no hydronephrosis. Cyst in the midpole right kidney. The bladder is unremarkable. Stomach/Bowel: Stomach is within normal limits. Appendix appears normal. No evidence of bowel wall thickening, distention, or inflammatory changes. Vascular/Lymphatic: Moderate aortic atherosclerosis. No aneurysm. No suspicious nodes. Reproductive: Uterus and bilateral adnexa are unremarkable. Other: Negative for free air or free fluid Musculoskeletal: No acute osseous abnormality IMPRESSION: No CT evidence for acute intra-abdominopelvic abnormality Electronically Signed   By: Jasmine Pang  M.D.   On: 10/23/2020 21:35   DG Chest Portable 1 View  Result Date: 10/23/2020 CLINICAL DATA:  Altered level of consciousness, history of dimension EXAM: PORTABLE CHEST 1 VIEW COMPARISON:  10/15/2020 FINDINGS: Single frontal view of the chest demonstrates a stable cardiac silhouette. No airspace disease, effusion, or pneumothorax. No acute bony abnormalities. IMPRESSION: 1. Stable chest, no acute process. Electronically Signed   By: Sharlet Salina M.D.   On: 10/23/2020 17:56    EKG: Personally reviewed.  Rhythm is normal sinus rhythm with heart rate of 86 bpm.  No dynamic ST segment changes appreciated.  Assessment/Plan Principal Problem:   Acute metabolic encephalopathy  Patient presenting with severe lethargy and minimal responsiveness  At this point, most likely etiology is multidrug-resistant E. coli urinary tract infection that has been inadequately treated with Keflex Initiating intravenous meropenem Hydrating patient gently with intravenous isotonic fluids Additionally obtaining TSH, ammonia, B12, LFTs and ABG If patient's mentation fails to rapidly improve with treatment of urinary tract infection encephalopathy work-up will be expanded. SLP evaluation ordered for the morning as I am concerned patient is currently aspiration risk.  Holding oral medications in the meantime.  Active Problems:   Urinary tract infection due to extended-spectrum beta lactamase (ESBL) producing Escherichia coli  Treatment of urinary tract infection with intravenous meropenem as noted above Gentle intravenous hydration Blood cultures additionally obtained Abdominal tenderness on exam which prompted CT imaging of the abdomen pelvis which identifies no evidence of stone or pyelonephritis    Hypothyroidism  Temporarily holding oral Synthroid due due to severe lethargy  Will resume once cleared safe to swallow by speech therapy.    Essential hypertension  Holding home regimen of oral  antihypertensives while patient is unsafe to swallow As needed intravenous and hypertensives ordered for markedly elevated blood pressure.    Frontotemporal dementia with behavioral disturbance (HCC) and Warnicke Korsakoff syndrome  Advanced frontotemporal dementia complicates assessment of mentation That being said, sister reports that patient's mentation currently dramatically different from baseline.    History of alcohol abuse  Has been in remission for several years according to sister    Mixed diabetic hyperlipidemia associated with type 2 diabetes mellitus (HCC)  We will resume statin therapy once patient is safe to take oral intake    Code Status:  DNR  code status decision has been confirmed with: sister who is POA  Family Communication: Plan of care discussed with sister who is POA via phone conversation  Status is: Inpatient  Remains inpatient appropriate because:Altered mental status, Ongoing diagnostic testing needed not appropriate for outpatient work up, IV treatments appropriate due to intensity of illness or inability to take PO, and Inpatient level of care appropriate due to severity of illness  Dispo: The patient is from: ALF              Anticipated d/c is to: ALF              Patient currently is not medically stable to d/c.   Difficult to place patient No        Marinda Elk MD [facility] Pager [phone]  If 7PM-7AM, please contact night-coverage [email or web address] Use universal [facility] password for that web site. If you do not have the password, please call the hospital operator.  10/24/2020, 2:07 AM

## 2020-10-24 NOTE — Progress Notes (Signed)
Pharmacy Antibiotic Note  Alexis Becker is a 70 y.o. female admitted on 10/23/2020 with PMH dementia, T2DM, currently living in a memory care facility who presents to the ED for evaluation of altered mental status.  Pharmacy has been consulted to dose merrem for ESBL infection.  Plan: Merrem 1gm IV q8h Follow renal function, cultures and clinical course  Height: 5\' 5"  (165.1 cm) Weight: 113 kg (249 lb 1.9 oz) IBW/kg (Calculated) : 57  Temp (24hrs), Avg:97.7 F (36.5 C), Min:97.5 F (36.4 C), Max:97.9 F (36.6 C)  Recent Labs  Lab 10/23/20 1813  WBC 12.5*  CREATININE 0.62    Estimated Creatinine Clearance: 84.4 mL/min (by C-G formula based on SCr of 0.62 mg/dL).    No Known Allergies  Antimicrobials this admission: 9/10 merrem >>  Dose adjustments this admission:   Microbiology results: 9/11 BCx:  9/10 UCx:   Thank you for allowing pharmacy to be a part of this patient's care.  11/10 RPh 10/24/2020, 2:25 AM

## 2020-10-24 NOTE — Progress Notes (Signed)
Pt cannot take PO medication with her current LOC; MD was notified (Dr Leafy Half, [place]). We will assess PO's ability when she can wake up

## 2020-10-24 NOTE — Progress Notes (Signed)
The patient is injury-free, afebrile, and drowsy and responds only to sternum rub or pain. Pt's sister reports that pt had a similar LOC at the memory facility and the ED. Dr Drema Pry came to assess pt, and she would not stay awake or respond to any questions. The admission information has been collected from her sister [place] via phone. All assessment was based on objective data. Pt is NPO, and PO medication is on hold till the completion of the swallow evaluation. She is hemodynamically stable with no signs or symptoms of distress. We will continue to monitor and work toward achieving the care plan goals

## 2020-10-24 NOTE — Evaluation (Signed)
Clinical/Bedside Swallow Evaluation Patient Details  Name: Alexis Becker MRN: [id number] Date of Birth: Mar 23, 1950  Today's Date: 10/24/2020 Time: SLP Start Time (ACUTE ONLY): 1415 SLP Stop Time (ACUTE ONLY): 1430 SLP Time Calculation (min) (ACUTE ONLY): 15 min  Past Medical History:  Past Medical History:  Diagnosis Date   Dementia (HCC)    Diabetes mellitus without complication (HCC)    Hx of BKA, left (HCC) 10/24/2020   Thyroid disease    Past Surgical History: History reviewed. No pertinent surgical history. HPI:  70 year old female with past medical history of frontotemporal dementia, Warnicke Korsakoff syndrome, hypertension, hyperlipidemia, hypothyroidism, insulin-dependent diabetes mellitus type 2, left BKA (S/P acute limb ischemia) and remote history of alcohol abuse currently in remission who presents to [facility] emergency department due to progressively worsening lethargy and confusion.  She was diagnosed with acute metabolic encephalopathy likely secondary to multi-drug resistent UTI.  Per her sister whom was present patient's mentation is closer to baseline this date.  Most recent chest xray was showing stable chest with no acute process seen.   Assessment / Plan / Recommendation Clinical Impression  Clinical swallowing evaluation was completed using thin liquids via spoon, cup and straw, pureed material and dry solids.  The patient's sister whom was present reported no obvious issues swallowing prior to admission and that the patient had a good appetite.  She reported that the patient's mentation today is closer to her usual baseline.  Limited cranial nerve exam was completed due to her issues following commands.  Lingual, labial, and facial range of motion appeared adequate.  Lingual strength was good.  Strength of the other structures and sensation was unable to be assessed.  Her oral and pharyngeal swallow appeared to be functional.  Mastication of dry solids appeared  to be adequate with no oral residue seen post swallow.  Swallow trigger was appreciated to palpation and overt s/s of aspiration were not seen even given serial sips of thin liquids via straw.  Recommend a regular diet with thin liquids.  ST will follow briefly for therapeutic diet tolerance. SLP Visit Diagnosis: Dysphagia, unspecified (R13.10)    Aspiration Risk  Mild aspiration risk    Diet Recommendation   Regular with thin liquids.  Medication Administration: Whole meds with liquid    Other  Recommendations Oral Care Recommendations: Oral care BID   Follow up Recommendations Other (comment) (TBD)      Frequency and Duration min 1 x/week  2 weeks       Prognosis Prognosis for Safe Diet Advancement: Good Barriers to Reach Goals: Cognitive deficits      Swallow Study   General Date of Onset: 10/23/20 HPI: 70 year old female with past medical history of frontotemporal dementia, Warnicke Korsakoff syndrome, hypertension, hyperlipidemia, hypothyroidism, insulin-dependent diabetes mellitus type 2, left BKA (S/P acute limb ischemia) and remote history of alcohol abuse currently in remission who presents to [facility] emergency department due to progressively worsening lethargy and confusion.  She was diagnosed with acute metabolic encephalopathy likely secondary to multi-drug resistent UTI.  Per her sister whom was present patient's mentation is closer to baseline this date.  Most recent chest xray was showing stable chest with no acute process seen. Type of Study: Bedside Swallow Evaluation Diet Prior to this Study: NPO Temperature Spikes Noted: No Respiratory Status: Nasal cannula (2.5L) History of Recent Intubation: No Behavior/Cognition: Alert;Cooperative;Pleasant mood;Confused;Requires cueing;Doesn't follow directions Oral Cavity Assessment: Within Functional Limits Oral Care Completed by SLP: No Oral Cavity - Dentition: Adequate  natural dentition Vision: Functional  for self-feeding Self-Feeding Abilities: Able to feed self Patient Positioning: Upright in bed Baseline Vocal Quality: Normal Volitional Swallow: Able to elicit    Oral/Motor/Sensory Function Overall Oral Motor/Sensory Function: Other (comment) (Limited assessment)   Ice Chips Ice chips: Not tested   Thin Liquid Thin Liquid: Within functional limits Presentation: Cup;Spoon;Straw;Self Fed    Nectar Thick Nectar Thick Liquid: Not tested   Honey Thick Honey Thick Liquid: Not tested   Puree Puree: Within functional limits Presentation: Self Fed;Spoon   Solid     Solid: Within functional limits Presentation: Self Fed     Dimas Aguas, MA, CCC-SLP Acute Rehab SLP [phone]  Fleet Contras 10/24/2020,2:44 PM

## 2020-10-24 NOTE — Progress Notes (Signed)
Pt is somnolent at arrival to the unit and arousable only with sternum rub. She opens her eyes briefly during the MD assessment and goes back to sleep. Her vital signs are stable, and no signs or symptoms of distress. The admissions assessment questions were collected from her sister POA "Dara Lords". Her sister reports that she was very somnolent earlier in the assisted living facility and could not stay awake for her; for this reason, she was brought to ED for evaluation and treatment

## 2020-10-24 NOTE — Progress Notes (Signed)
PROGRESS NOTE    Alexis Becker  QAS:[id number] DOB: [birth year] DOA: 10/23/2020 PCP: Ashley Royalty, NP    Chief Complaint  Patient presents with   Altered Mental Status    Brief Narrative:  70 year old female with past medical history of frontotemporal dementia, Warnicke Korsakoff syndrome, hypertension, hyperlipidemia, hypothyroidism, insulin-dependent diabetes mellitus type 2, left BKA (S/P acute limb ischemia) and remote history of alcohol abuse currently in remission who presents to [facility] long hospital emergency department due to progressively worsening lethargy and confusion  Subjective:  She has much improved, she is fully alert, follow command She has advanced dementia with vocal tics at baseline   Assessment & Plan:   Principal Problem:   Acute metabolic encephalopathy Active Problems:   Urinary tract infection due to extended-spectrum beta lactamase (ESBL) producing Escherichia coli   Hypothyroidism   Essential hypertension   Frontotemporal dementia with behavioral disturbance (HCC)   History of alcohol abuse   Wernicke-Korsakoff syndrome (HCC)   Hx of BKA, left (HCC)   Mixed diabetic hyperlipidemia associated with type 2 diabetes mellitus (HCC)  Acute metabolic encephalopathy likely due to multidrug-resistant E. coli UTI Blood culture in process -Responded to meropenem, improving,  Hypomagnesemia Replace mag  Insulin-dependent type 2 diabetes, controlled A1c 6.1 Continue insulin  Hypertension continue lisinopril  Hyperlipidemia continue Zocor  Hypothyroidism continue Synthroid  Advanced frontotemporal dementia Presents with significant lethargy, home psych meds held, resume gradually to avoid oversedation  From memory care, will get PT eval   Nutritional Assessment:  The patient's BMI is: Body mass index is 41.46 kg/m.Marland Kitchen  Seen by dietician.  I agree with the assessment and plan as outlined below:  Nutrition Status:        .      Skin Assessment:  I have examined the patient's skin and I agree with the wound assessment as performed by the wound care RN as outlined below:  Pressure Injury 10/24/20 Sacrum Right;Left;Mid;Bilateral Stage 2 -  Partial thickness loss of dermis presenting as a shallow open injury with a red, pink wound bed without slough. pressure sores on buttocks (Active)  10/24/20 0033  Location: Sacrum  Location Orientation: Right;Left;Mid;Bilateral  Staging: Stage 2 -  Partial thickness loss of dermis presenting as a shallow open injury with a red, pink wound bed without slough.  Wound Description (Comments): pressure sores on buttocks  Present on Admission: Yes    Unresulted Labs (From admission, onward)     Start     Ordered   10/25/20 0500  CBC  Tomorrow morning,   R       Question:  Specimen collection method  Answer:  Lab=Lab collect   10/24/20 1902   10/25/20 0500  Basic metabolic panel  Tomorrow morning,   R       Question:  Specimen collection method  Answer:  Lab=Lab collect   10/24/20 1902   10/25/20 0500  Magnesium  Tomorrow morning,   R       Question:  Specimen collection method  Answer:  Lab=Lab collect   10/24/20 1902   10/24/20 0002  Culture, blood (routine x 2)  BLOOD CULTURE X 2,   R (with TIMED occurrences)      10/24/20 0002   10/23/20 2118  Urine Culture  Once,   STAT       Question:  Indication  Answer:  Dysuria   10/23/20 2117              DVT prophylaxis: enoxaparin (LOVENOX) injection  40 mg Start: 10/24/20 1000   Code Status:DNR Family Communication: Daughter at bedside Disposition:   Status is: Inpatient   Dispo: The patient is from: Memory care              Anticipated d/c is to: Pending PT eval              Anticipated d/c date is: 24 to 48 hours                Consultants:  none  Procedures:  none  Antimicrobials:    Anti-infectives (From admission, onward)    Start     Dose/Rate Route Frequency Ordered Stop   10/24/20 0600   meropenem (MERREM) 1 g in sodium chloride 0.9 % 100 mL IVPB        1 g 200 mL/hr over 30 Minutes Intravenous Every 8 hours 10/24/20 0223     10/23/20 2115  meropenem (MERREM) 1 g in sodium chloride 0.9 % 100 mL IVPB        1 g 200 mL/hr over 30 Minutes Intravenous  Once 10/23/20 2112 10/23/20 2234          Objective: Vitals:   10/24/20 0450 10/24/20 0801 10/24/20 1200 10/24/20 1326  BP: 129/69 124/78 (!) 142/74 140/76  Pulse: 71 70 76 (!) 104  Resp: Temp: 98 F (36.7 C) 98 F (36.7 C) 97.8 F (36.6 C) 98.9 F (37.2 C)  TempSrc:  Oral Axillary   SpO2: 99% 100% 100% 92%  Weight:      Height:        Intake/Output Summary (Last 24 hours) at 10/24/2020 1904 Last data filed at 10/24/2020 1827 Gross per 24 hour  Intake 356 ml  Output --  Net 356 ml   Filed Weights   10/23/20 1732  Weight: 113 kg    Examination:  General exam: demented,  alert , calm, NAD Respiratory system: Clear to auscultation. Respiratory effort normal. Cardiovascular system: S1 & S2 heard, RRR. Marland Kitchen Gastrointestinal system: Abdomen is nondistended, soft and nontender. Normal bowel sounds heard. Central nervous system: Alert and oriented to self. Follow commands inconsistently. Extremities: no edema Skin: pressure injury as described above  Psychiatry: demented, with vocal tics , no agitation     Data Reviewed: I have personally reviewed following labs and imaging studies  CBC: Recent Labs  Lab 10/23/20 1813 10/24/20 0232  WBC 12.5* 12.1*  NEUTROABS  --  9.3*  HGB 10.2* 10.7*  HCT 32.2* 33.4*  MCV 86.3 86.5  PLT 308 319    Basic Metabolic Panel: Recent Labs  Lab 10/23/20 1813 10/24/20 0232  NA 145 140  K 4.3 3.9  CL 111 106  CO2 24 25  GLUCOSE 123* 131*  BUN 28* 26*  CREATININE 0.62 0.70  CALCIUM 10.3 9.6  MG  --  1.6*    GFR: Estimated Creatinine Clearance: 84.4 mL/min (by C-G formula based on SCr of 0.7 mg/dL).  Liver Function Tests: Recent Labs  Lab  10/23/20 1813 10/24/20 0232  AST 17 13*  ALT 16 16  ALKPHOS 55 56  BILITOT 0.7 0.6  PROT 7.0 6.7  ALBUMIN 3.1* 3.0*    CBG: Recent Labs  Lab 10/24/20 0713 10/24/20 1155 10/24/20 1322 10/24/20 1650  GLUCAP 94 74 93 175*     Recent Results (from the past 240 hour(s))  Urine Culture     Status: Abnormal   Collection Time: 10/15/20 11:59 PM   Specimen: In/Out  Cath Urine  Result Value Ref Range Status   Specimen Description IN/OUT CATH URINE  Final   Special Requests   Final    NONE Performed at [facility], [address], [place], [place] [address]    Culture (A)  Final    7,000 COLONIES/mL ESCHERICHIA COLI Confirmed Extended Spectrum Beta-Lactamase Producer (ESBL).  In bloodstream infections from ESBL organisms, carbapenems are preferred over piperacillin/tazobactam. They are shown to have a lower risk of mortality.    Report Status 10/18/2020 FINAL  Final   Organism ID, Bacteria ESCHERICHIA COLI (A)  Final      Susceptibility   Escherichia coli - MIC*    AMPICILLIN >=32 RESISTANT Resistant     CEFAZOLIN >=64 RESISTANT Resistant     CEFEPIME 16 RESISTANT Resistant     CEFTRIAXONE >=64 RESISTANT Resistant     CIPROFLOXACIN >=4 RESISTANT Resistant     GENTAMICIN >=16 RESISTANT Resistant     IMIPENEM <=0.25 SENSITIVE Sensitive     NITROFURANTOIN 128 RESISTANT Resistant     TRIMETH/SULFA >=320 RESISTANT Resistant     AMPICILLIN/SULBACTAM >=32 RESISTANT Resistant     PIP/TAZO 8 SENSITIVE Sensitive     * 7,000 COLONIES/mL ESCHERICHIA COLI  Resp Panel by RT-PCR (Flu A&B, Covid) Nasopharyngeal Swab     Status: None   Collection Time: 10/23/20  9:23 PM   Specimen: Nasopharyngeal Swab; Nasopharyngeal(NP) swabs in vial transport medium  Result Value Ref Range Status   SARS Coronavirus 2 by RT PCR NEGATIVE NEGATIVE Final    Comment: (NOTE) SARS-CoV-2 target nucleic acids are NOT DETECTED.  The SARS-CoV-2 RNA is generally detectable in upper respiratory specimens  during the acute phase of infection. The lowest concentration of SARS-CoV-2 viral copies this assay can detect is 138 copies/mL. A negative result does not preclude SARS-Cov-2 infection and should not be used as the sole basis for treatment or other patient management decisions. A negative result may occur with  improper specimen collection/handling, submission of specimen other than nasopharyngeal swab, presence of viral mutation(s) within the areas targeted by this assay, and inadequate number of viral copies(<138 copies/mL). A negative result must be combined with clinical observations, patient history, and epidemiological information. The expected result is Negative.  Fact Sheet for Patients:  BloggerCourse.com  Fact Sheet for Healthcare Providers:  SeriousBroker.it  This test is no t yet approved or cleared by the [place] FDA and  has been authorized for detection and/or diagnosis of SARS-CoV-2 by FDA under an Emergency Use Authorization (EUA). This EUA will remain  in effect (meaning this test can be used) for the duration of the COVID-19 declaration under Section 564(b)(1) of the Act, 21 U.S.C.section 360bbb-3(b)(1), unless the authorization is terminated  or revoked sooner.       Influenza A by PCR NEGATIVE NEGATIVE Final   Influenza B by PCR NEGATIVE NEGATIVE Final    Comment: (NOTE) The Xpert Xpress SARS-CoV-2/FLU/RSV plus assay is intended as an aid in the diagnosis of influenza from Nasopharyngeal swab specimens and should not be used as a sole basis for treatment. Nasal washings and aspirates are unacceptable for Xpert Xpress SARS-CoV-2/FLU/RSV testing.  Fact Sheet for Patients: BloggerCourse.com  Fact Sheet for Healthcare Providers: SeriousBroker.it  This test is not yet approved or cleared by the [place] FDA and has been authorized for detection  and/or diagnosis of SARS-CoV-2 by FDA under an Emergency Use Authorization (EUA). This EUA will remain in effect (meaning this test can be used) for the  duration of the COVID-19 declaration under Section 564(b)(1) of the Act, 21 U.S.C. section 360bbb-3(b)(1), unless the authorization is terminated or revoked.  Performed at [facility], [address] [place], [place] [address]   Culture, blood (routine x 2)     Status: None (Preliminary result)   Collection Time: 10/24/20 12:40 AM   Specimen: BLOOD RIGHT ARM  Result Value Ref Range Status   Specimen Description   Final    BLOOD RIGHT ARM Performed at [facility], [address] [place], [place] [address]    Special Requests   Final    BOTTLES DRAWN AEROBIC ONLY Blood Culture adequate volume Performed at [facility], [address] [place], [place] [address]    Culture PENDING  Incomplete   Report Status PENDING  Incomplete  MRSA Next Gen by PCR, Nasal     Status: None   Collection Time: 10/24/20  5:03 PM   Specimen: Nasal Mucosa; Nasal Swab  Result Value Ref Range Status   MRSA by PCR Next Gen NOT DETECTED NOT DETECTED Final    Comment: (NOTE) The GeneXpert MRSA Assay (FDA approved for NASAL specimens only), is one component of a comprehensive MRSA colonization surveillance program. It is not intended to diagnose MRSA infection nor to guide or monitor treatment for MRSA infections. Test performance is not FDA approved in patients less than 38 years old. Performed at [facility], [address] [place], [place] [address]          Radiology Studies: CT ABDOMEN PELVIS W CONTRAST  Result Date: 10/23/2020 CLINICAL DATA:  Right lower quadrant pain EXAM: CT ABDOMEN AND PELVIS WITH CONTRAST TECHNIQUE: Multidetector CT imaging of the abdomen and pelvis was performed using the standard protocol following bolus administration of intravenous contrast. CONTRAST:   42mL OMNIPAQUE IOHEXOL 350 MG/ML SOLN COMPARISON:  None. FINDINGS: Lower chest: Lung bases demonstrate mild sub pleural reticulation. No acute consolidation or effusion. Coronary vascular calcification. Hepatobiliary: No focal liver abnormality is seen. No gallstones, gallbladder wall thickening, or biliary dilatation. Pancreas: Unremarkable. No pancreatic ductal dilatation or surrounding inflammatory changes. Spleen: Normal in size without focal abnormality. Adrenals/Urinary Tract: Adrenal glands are normal. Kidneys show no hydronephrosis. Cyst in the midpole right kidney. The bladder is unremarkable. Stomach/Bowel: Stomach is within normal limits. Appendix appears normal. No evidence of bowel wall thickening, distention, or inflammatory changes. Vascular/Lymphatic: Moderate aortic atherosclerosis. No aneurysm. No suspicious nodes. Reproductive: Uterus and bilateral adnexa are unremarkable. Other: Negative for free air or free fluid Musculoskeletal: No acute osseous abnormality IMPRESSION: No CT evidence for acute intra-abdominopelvic abnormality Electronically Signed   By: Jasmine Pang M.D.   On: 10/23/2020 21:35   DG Chest Portable 1 View  Result Date: 10/23/2020 CLINICAL DATA:  Altered level of consciousness, history of dimension EXAM: PORTABLE CHEST 1 VIEW COMPARISON:  10/15/2020 FINDINGS: Single frontal view of the chest demonstrates a stable cardiac silhouette. No airspace disease, effusion, or pneumothorax. No acute bony abnormalities. IMPRESSION: 1. Stable chest, no acute process. Electronically Signed   By: Sharlet Salina M.D.   On: 10/23/2020 17:56        Scheduled Meds:  enoxaparin (LOVENOX) injection  40 mg Subcutaneous Q24H   insulin aspart  0-15 Units Subcutaneous TID AC & HS   insulin glargine-yfgn  7 Units Subcutaneous Daily   Continuous Infusions:  lactated ringers 1,000 mL with potassium chloride 20 mEq infusion     magnesium sulfate bolus IVPB  meropenem (MERREM) IV 1 g  (10/24/20 1527)     LOS: 1 day   Time spent: 25mins Greater than 50% of this time was spent in counseling, explanation of diagnosis, planning of further management, and coordination of care.   Voice Recognition Reubin Milan/Dragon dictation system was used to create this note, attempts have been made to correct errors. Please contact the author with questions and/or clarifications.   Albertine GratesFang Cherokee Boccio, MD PhD FACP [facility]  Available via Epic secure chat 7am-7pm for nonurgent issues Please page for urgent issues To page the attending provider between 7A-7P or the covering provider during after hours 7P-7A, please log into the web site [email or web address] and access using universal [place] password for that web site. If you do not have the password, please call the hospital operator.    10/24/2020, 7:04 PM

## 2020-10-24 NOTE — Progress Notes (Signed)
For 18+ hours pt has been alert / awake and repeatedly saying " a choo" acknowledges conversation but continues with same response " a choo" . Attempts at redirecting pt is unsuccessful as this is distracting to other patients.

## 2020-10-25 DIAGNOSIS — L8990 Pressure ulcer of unspecified site, unspecified stage: Secondary | ICD-10-CM | POA: Insufficient documentation

## 2020-10-25 LAB — CBC
HCT: 32.7 % — ABNORMAL LOW (ref 36.0–46.0)
Hemoglobin: 10.6 g/dL — ABNORMAL LOW (ref 12.0–15.0)
MCH: 27.5 pg (ref 26.0–34.0)
MCHC: 32.4 g/dL (ref 30.0–36.0)
MCV: 84.7 fL (ref 80.0–100.0)
Platelets: 312 10*3/uL (ref 150–400)
RBC: 3.86 MIL/uL — ABNORMAL LOW (ref 3.87–5.11)
RDW: 12.9 % (ref 11.5–15.5)
WBC: 10 10*3/uL (ref 4.0–10.5)
nRBC: 0 % (ref 0.0–0.2)

## 2020-10-25 LAB — GLUCOSE, CAPILLARY
Glucose-Capillary: 143 mg/dL — ABNORMAL HIGH (ref 70–99)
Glucose-Capillary: 126 mg/dL — ABNORMAL HIGH (ref 70–99)
Glucose-Capillary: 151 mg/dL — ABNORMAL HIGH (ref 70–99)
Glucose-Capillary: 154 mg/dL — ABNORMAL HIGH (ref 70–99)
Glucose-Capillary: 155 mg/dL — ABNORMAL HIGH (ref 70–99)

## 2020-10-25 LAB — BASIC METABOLIC PANEL
Anion gap: 9 (ref 5–15)
BUN: 18 mg/dL (ref 8–23)
CO2: 21 mmol/L — ABNORMAL LOW (ref 22–32)
Calcium: 8.4 mg/dL — ABNORMAL LOW (ref 8.9–10.3)
Chloride: 104 mmol/L (ref 98–111)
Creatinine, Ser: 0.67 mg/dL (ref 0.44–1.00)
GFR, Estimated: >60 mL/min (ref >60)
Glucose, Bld: 109 mg/dL — ABNORMAL HIGH (ref 70–99)
Potassium: 4.3 mmol/L (ref 3.5–5.1)
Sodium: 134 mmol/L — ABNORMAL LOW (ref 135–145)

## 2020-10-25 LAB — MAGNESIUM: Magnesium: 1.9 mg/dL (ref 1.7–2.4)

## 2020-10-25 MED ORDER — QUETIAPINE FUMARATE 25 MG PO TABS
50.0000 mg | ORAL_TABLET | Freq: Every day | ORAL | Status: DC
  Administered 2020-10-25 – 2020-10-26 (×2): 50 mg via ORAL
  Filled 2020-10-25 (×2): qty 2

## 2020-10-25 MED ORDER — MELATONIN 5 MG PO TABS
5.0000 mg | ORAL_TABLET | Freq: Once | ORAL | Status: AC
  Administered 2020-10-25: 5 mg via ORAL
  Filled 2020-10-25: qty 1

## 2020-10-25 MED ORDER — CLONAZEPAM 0.5 MG PO TABS
0.5000 mg | ORAL_TABLET | Freq: Three times a day (TID) | ORAL | Status: DC
  Administered 2020-10-25 – 2020-10-26 (×5): 0.5 mg via ORAL
  Filled 2020-10-25 (×5): qty 1

## 2020-10-25 MED ORDER — DIPHENHYDRAMINE HCL 50 MG/ML IJ SOLN
12.5000 mg | Freq: Once | INTRAMUSCULAR | Status: AC
  Administered 2020-10-25: 12.5 mg via INTRAVENOUS
  Filled 2020-10-25: qty 1

## 2020-10-25 NOTE — Evaluation (Signed)
Physical Therapy Evaluation Patient Details Name: Alexis Becker MRN: [id number] DOB: 05/28/1950 Today's Date: 10/25/2020  History of Present Illness  70 yo female admitted with acute metabolic encephalopathy. Hx of multiple falls, DM, L AKA, Warnicke Korsakoff, dementia  Clinical Impression  On eval, pt required Max Assist +2 for bed mobility and Min guard A to sit EOB for ~ 5 minutes. She was able to perform some LAQ exercises while sitting EOB without LOB. Multimodal cueing and max encouragement required for participation. Sister was present during session. Discussed d/c plan-she would like for pt to return to [facility] with HHPT, HHOT f/u. She feels pt would benefit from remaining in a familiar environment. Will plan to follow and progress activity as tolerated during hospital stay.        Recommendations for follow up therapy are one component of a multi-disciplinary discharge planning process, led by the attending physician.  Recommendations may be updated based on patient status, additional functional criteria and insurance authorization.  Follow Up Recommendations Home health PT (at ALF as long as facility can provide current level of care. Sister prefers for pt to return to familiar environment to attempt a trial of HHPT, HHOT.)    Equipment Recommendations  None recommended by PT    Recommendations for Other Services       Precautions / Restrictions Precautions Precautions: Fall Precaution Comments: incontinent;fearful of falling Restrictions Weight Bearing Restrictions: No      Mobility  Bed Mobility Overal bed mobility: Needs Assistance Bed Mobility: Rolling;Supine to Sit;Sit to Supine Rolling: Max assist   Supine to sit: Max assist;+2 for physical assistance;+2 for safety/equipment;HOB elevated Sit to supine: Mod assist;+2 for physical assistance;+2 for safety/equipment;HOB elevated   General bed mobility comments: Max assist for rolling in bed. Max A +2 for supine  to sit; Mod A +2 for sit to supine. Max multimodal cueing and encouragement. Utilized bedpad to assist with positioning. Pt sat EOB with Min guard A for at least 5 minutes and perfomed LAQs with verbal cues.    Transfers                 General transfer comment: NT-unable to safely attempt with +1 A.  Ambulation/Gait                Stairs            Wheelchair Mobility    Modified Rankin (Stroke Patients Only)       Balance Overall balance assessment: Needs assistance Sitting-balance support: Single extremity supported;Feet unsupported Sitting balance-Leahy Scale: Fair Sitting balance - Comments: Pt sat EOB for at least 5 minutes with Min guard assist.                                     Pertinent Vitals/Pain Pain Assessment: Faces Faces Pain Scale: Hurts little more Pain Location: grimaces with some movement but unable to state location Pain Descriptors / Indicators: Grimacing;Discomfort;Sore Pain Intervention(s): Limited activity within patient's tolerance;Monitored during session;Repositioned    Home Living Family/patient expects to be discharged to:: Assisted living               Home Equipment: Walker - 2 wheels;Wheelchair - manual      Prior Function Level of Independence: Needs assistance   Gait / Transfers Assistance Needed: per sister, was able to stand and pivot with RW until recurrent UTI issues. Most recently, requiring +2 assist for  ADLs with staff at ALF  ADL's / Homemaking Assistance Needed: total assist        Hand Dominance        Extremity/Trunk Assessment   Upper Extremity Assessment Upper Extremity Assessment: Defer to OT evaluation    Lower Extremity Assessment Lower Extremity Assessment: Generalized weakness    Cervical / Trunk Assessment Cervical / Trunk Assessment: Normal  Communication   Communication: Expressive difficulties;Receptive difficulties  Cognition Arousal/Alertness:  Awake/alert Behavior During Therapy: WFL for tasks assessed/performed Overall Cognitive Status: History of cognitive impairments - at baseline                                 General Comments: follows 1 step commands inconsistently.      General Comments      Exercises     Assessment/Plan    PT Assessment Patient needs continued PT services  PT Problem List Decreased strength;Decreased mobility;Decreased balance;Decreased activity tolerance;Decreased cognition;Decreased knowledge of use of DME;Decreased safety awareness       PT Treatment Interventions DME instruction;Gait training;Therapeutic exercise;Balance training;Functional mobility training;Therapeutic activities;Patient/family education    PT Goals (Current goals can be found in the Care Plan section)  Acute Rehab PT Goals Patient Stated Goal: pt unable to state PT Goal Formulation: With family Time For Goal Achievement: 11/08/20 Potential to Achieve Goals: Poor    Frequency Min 2X/week   Barriers to discharge        Co-evaluation               AM-PAC PT "6 Clicks" Mobility  Outcome Measure Help needed turning from your back to your side while in a flat bed without using bedrails?: Total Help needed moving from lying on your back to sitting on the side of a flat bed without using bedrails?: Total Help needed moving to and from a bed to a chair (including a wheelchair)?: Total Help needed standing up from a chair using your arms (e.g., wheelchair or bedside chair)?: Total Help needed to walk in hospital room?: Total Help needed climbing 3-5 steps with a railing? : Total 6 Click Score: 6    End of Session   Activity Tolerance: Patient tolerated treatment well Patient left: in bed;with call bell/phone within reach;with bed alarm set;with family/visitor present   PT Visit Diagnosis: Muscle weakness (generalized) (M62.81);History of falling (Z91.81);Other abnormalities of gait and mobility  (R26.89)    Time: 4627-0350 PT Time Calculation (min) (ACUTE ONLY): 26 min   Charges:   PT Evaluation $PT Eval Moderate Complexity: 1 Mod PT Treatments $Therapeutic Activity: 8-22 mins           Faye Ramsay, PT Acute Rehabilitation  Office: [phone] Pager: [phone]

## 2020-10-25 NOTE — Progress Notes (Signed)
PROGRESS NOTE    Alexis Becker  JEH:[id number] DOB: 1950/11/23 DOA: 10/23/2020 PCP: Ashley Royalty, NP    Chief Complaint  Patient presents with   Altered Mental Status    Brief Narrative:  70 year old female with past medical history of frontotemporal dementia, Warnicke Korsakoff syndrome, hypertension, hyperlipidemia, hypothyroidism, insulin-dependent diabetes mellitus type 2, left BKA (S/P acute limb ischemia) and remote history of alcohol abuse currently in remission who presents to [facility] emergency department due to progressively worsening lethargy and confusion  Subjective: Seems to be back at baseline, denies complaints  Assessment & Plan:   Principal Problem:   Acute metabolic encephalopathy Active Problems:   Urinary tract infection due to extended-spectrum beta lactamase (ESBL) producing Escherichia coli   Hypothyroidism   Essential hypertension   Frontotemporal dementia with behavioral disturbance (HCC)   History of alcohol abuse   Wernicke-Korsakoff syndrome (HCC)   Hx of BKA, left (HCC)   Mixed diabetic hyperlipidemia associated with type 2 diabetes mellitus (HCC)   Pressure injury of skin    Acute metabolic encephalopathy likely due to multidrug-resistant E. coli UTI Blood culture in process -Responded to meropenem -continue IV abx while in hospital and give dose of fosfomycin prior to d/c  Hypomagnesemia Replace mag  Insulin-dependent type 2 diabetes, controlled A1c 6.1 Continue insulin  Hypertension  -continue lisinopril  Hyperlipidemia  -continue Zocor  Hypothyroidism  -continue Synthroid  Advanced frontotemporal dementia -seems to be at baseline  From memory care, will get PT eval   obesity The patient's BMI is: Body mass index is 41.46 kg/m.Marland Kitchen   Skin Assessment:  I have examined the patient's skin and I agree with the wound assessment as performed by the wound care RN as outlined below:  Pressure Injury  10/24/20 Sacrum Right;Left;Mid;Bilateral Stage 2 -  Partial thickness loss of dermis presenting as a shallow open injury with a red, pink wound bed without slough. pressure sores on buttocks (Active)  10/24/20 0033  Location: Sacrum  Location Orientation: Right;Left;Mid;Bilateral  Staging: Stage 2 -  Partial thickness loss of dermis presenting as a shallow open injury with a red, pink wound bed without slough.  Wound Description (Comments): pressure sores on buttocks  Present on Admission: Yes      DVT prophylaxis: enoxaparin (LOVENOX) injection 40 mg Start: 10/24/20 1000   Code Status:DNR Disposition:   Status is: Inpatient   Dispo: The patient is from: Memory care              Anticipated d/c is to: memory care              Anticipated d/c date is: in AM                Consultants:  none    Antimicrobials:    Anti-infectives (From admission, onward)    Start     Dose/Rate Route Frequency Ordered Stop   10/24/20 0600  meropenem (MERREM) 1 g in sodium chloride 0.9 % 100 mL IVPB        1 g 200 mL/hr over 30 Minutes Intravenous Every 8 hours 10/24/20 0223     10/23/20 2115  meropenem (MERREM) 1 g in sodium chloride 0.9 % 100 mL IVPB        1 g 200 mL/hr over 30 Minutes Intravenous  Once 10/23/20 2112 10/23/20 2234          Objective: Vitals:   10/24/20 1200 10/24/20 1326 10/24/20 2014 10/25/20 0534  BP: (!) 142/74 140/76 Marland Kitchen)  168/85 123/62  Pulse: 76 (!) 104 76 71  Resp: Temp: 97.8 F (36.6 C) 98.9 F (37.2 C) 99.4 F (37.4 C) 98.8 F (37.1 C)  TempSrc: Axillary  Oral Axillary  SpO2: 100% 92% 94% 100%  Weight:      Height:        Intake/Output Summary (Last 24 hours) at 10/25/2020 1159 Last data filed at 10/25/2020 1126 Gross per 24 hour  Intake 596 ml  Output 2700 ml  Net -2104 ml   Filed Weights   10/23/20 1732  Weight: 113 kg    Examination:   General: Appearance:    Obese female in no acute distress     Lungs:      respirations unlabored  Heart:    Normal heart rate.    MS:   Below knee amputation of left lower extremity is noted.    Neurologic:   Says a few words, not cooperative with full neuro exam, pleasant- slow to process words      Data Reviewed: I have personally reviewed following labs and imaging studies  CBC: Recent Labs  Lab 10/23/20 1813 10/24/20 0232 10/25/20 0453  WBC 12.5* 12.1* 10.0  NEUTROABS  --  9.3*  --   HGB 10.2* 10.7* 10.6*  HCT 32.2* 33.4* 32.7*  MCV 86.3 86.5 84.7  PLT 308 319 312    Basic Metabolic Panel: Recent Labs  Lab 10/23/20 1813 10/24/20 0232 10/25/20 0453  NA 145 140 134*  K 4.3 3.9 4.3  CL 111 106 104  CO2 24 25 21*  GLUCOSE 123* 131* 109*  BUN 28* 26* 18  CREATININE 0.62 0.70 0.67  CALCIUM 10.3 9.6 8.4*  MG  --  1.6* 1.9    GFR: Estimated Creatinine Clearance: 84.4 mL/min (by C-G formula based on SCr of 0.67 mg/dL).  Liver Function Tests: Recent Labs  Lab 10/23/20 1813 10/24/20 0232  AST 17 13*  ALT 16 16  ALKPHOS 55 56  BILITOT 0.7 0.6  PROT 7.0 6.7  ALBUMIN 3.1* 3.0*    CBG: Recent Labs  Lab 10/24/20 1155 10/24/20 1322 10/24/20 1650 10/24/20 2301 10/25/20 0552  GLUCAP 74 93 175* 235* 143*     Recent Results (from the past 240 hour(s))  Urine Culture     Status: Abnormal   Collection Time: 10/15/20 11:59 PM   Specimen: In/Out Cath Urine  Result Value Ref Range Status   Specimen Description IN/OUT CATH URINE  Final   Special Requests   Final    NONE Performed at [facility], [address], [place], [place] [address]    Culture (A)  Final    7,000 COLONIES/mL ESCHERICHIA COLI Confirmed Extended Spectrum Beta-Lactamase Producer (ESBL).  In bloodstream infections from ESBL organisms, carbapenems are preferred over piperacillin/tazobactam. They are shown to have a lower risk of mortality.    Report Status 10/18/2020 FINAL  Final   Organism ID, Bacteria ESCHERICHIA COLI (A)  Final      Susceptibility    Escherichia coli - MIC*    AMPICILLIN >=32 RESISTANT Resistant     CEFAZOLIN >=64 RESISTANT Resistant     CEFEPIME 16 RESISTANT Resistant     CEFTRIAXONE >=64 RESISTANT Resistant     CIPROFLOXACIN >=4 RESISTANT Resistant     GENTAMICIN >=16 RESISTANT Resistant     IMIPENEM <=0.25 SENSITIVE Sensitive     NITROFURANTOIN 128 RESISTANT Resistant     TRIMETH/SULFA >=320 RESISTANT Resistant  AMPICILLIN/SULBACTAM >=32 RESISTANT Resistant     PIP/TAZO 8 SENSITIVE Sensitive     * 7,000 COLONIES/mL ESCHERICHIA COLI  Urine Culture     Status: Abnormal (Preliminary result)   Collection Time: 10/23/20  5:27 PM   Specimen: Urine, Clean Catch  Result Value Ref Range Status   Specimen Description   Final    URINE, CLEAN CATCH Performed at [facility], [address], [place], [place] [address]    Special Requests   Final    NONE Performed at [facility], [address], [place], [place] [address]    Culture (A)  Final    80,000 COLONIES/mL ESCHERICHIA COLI SUSCEPTIBILITIES TO FOLLOW Performed at [facility], [address] [place], [place] [address]    Report Status PENDING  Incomplete  Resp Panel by RT-PCR (Flu A&B, Covid) Nasopharyngeal Swab     Status: None   Collection Time: 10/23/20  9:23 PM   Specimen: Nasopharyngeal Swab; Nasopharyngeal(NP) swabs in vial transport medium  Result Value Ref Range Status   SARS Coronavirus 2 by RT PCR NEGATIVE NEGATIVE Final    Comment: (NOTE) SARS-CoV-2 target nucleic acids are NOT DETECTED.  The SARS-CoV-2 RNA is generally detectable in upper respiratory specimens during the acute phase of infection. The lowest concentration of SARS-CoV-2 viral copies this assay can detect is 138 copies/mL. A negative result does not preclude SARS-Cov-2 infection and should not be used as the sole basis for treatment or other patient management decisions. A negative result may occur with  improper specimen  collection/handling, submission of specimen other than nasopharyngeal swab, presence of viral mutation(s) within the areas targeted by this assay, and inadequate number of viral copies(<138 copies/mL). A negative result must be combined with clinical observations, patient history, and epidemiological information. The expected result is Negative.  Fact Sheet for Patients:  BloggerCourse.com  Fact Sheet for Healthcare Providers:  SeriousBroker.it  This test is no t yet approved or cleared by the [place] FDA and  has been authorized for detection and/or diagnosis of SARS-CoV-2 by FDA under an Emergency Use Authorization (EUA). This EUA will remain  in effect (meaning this test can be used) for the duration of the COVID-19 declaration under Section 564(b)(1) of the Act, 21 U.S.C.section 360bbb-3(b)(1), unless the authorization is terminated  or revoked sooner.       Influenza A by PCR NEGATIVE NEGATIVE Final   Influenza B by PCR NEGATIVE NEGATIVE Final    Comment: (NOTE) The Xpert Xpress SARS-CoV-2/FLU/RSV plus assay is intended as an aid in the diagnosis of influenza from Nasopharyngeal swab specimens and should not be used as a sole basis for treatment. Nasal washings and aspirates are unacceptable for Xpert Xpress SARS-CoV-2/FLU/RSV testing.  Fact Sheet for Patients: BloggerCourse.com  Fact Sheet for Healthcare Providers: SeriousBroker.it  This test is not yet approved or cleared by the [place] FDA and has been authorized for detection and/or diagnosis of SARS-CoV-2 by FDA under an Emergency Use Authorization (EUA). This EUA will remain in effect (meaning this test can be used) for the duration of the COVID-19 declaration under Section 564(b)(1) of the Act, 21 U.S.C. section 360bbb-3(b)(1), unless the authorization is terminated or revoked.  Performed at [facility], [address] [place], [place] [address]   Culture, blood (routine x 2)     Status: None (Preliminary result)   Collection Time: 10/24/20 12:40 AM   Specimen: BLOOD RIGHT ARM  Result Value Ref Range  Status   Specimen Description   Final    BLOOD RIGHT ARM Performed at [facility], [address] [place], [place] [address]    Special Requests   Final    BOTTLES DRAWN AEROBIC ONLY Blood Culture adequate volume Performed at [facility], [address] [place], [place] [address]    Culture PENDING  Incomplete   Report Status PENDING  Incomplete  MRSA Next Gen by PCR, Nasal     Status: None   Collection Time: 10/24/20  5:03 PM   Specimen: Nasal Mucosa; Nasal Swab  Result Value Ref Range Status   MRSA by PCR Next Gen NOT DETECTED NOT DETECTED Final    Comment: (NOTE) The GeneXpert MRSA Assay (FDA approved for NASAL specimens only), is one component of a comprehensive MRSA colonization surveillance program. It is not intended to diagnose MRSA infection nor to guide or monitor treatment for MRSA infections. Test performance is not FDA approved in patients less than 69 years old. Performed at [facility], [address] [place], [place] [address]          Radiology Studies: CT ABDOMEN PELVIS W CONTRAST  Result Date: 10/23/2020 CLINICAL DATA:  Right lower quadrant pain EXAM: CT ABDOMEN AND PELVIS WITH CONTRAST TECHNIQUE: Multidetector CT imaging of the abdomen and pelvis was performed using the standard protocol following bolus administration of intravenous contrast. CONTRAST:  80mL OMNIPAQUE IOHEXOL 350 MG/ML SOLN COMPARISON:  None. FINDINGS: Lower chest: Lung bases demonstrate mild sub pleural reticulation. No acute consolidation or effusion. Coronary vascular calcification. Hepatobiliary: No focal liver abnormality is seen. No gallstones, gallbladder wall thickening, or biliary dilatation. Pancreas:  Unremarkable. No pancreatic ductal dilatation or surrounding inflammatory changes. Spleen: Normal in size without focal abnormality. Adrenals/Urinary Tract: Adrenal glands are normal. Kidneys show no hydronephrosis. Cyst in the midpole right kidney. The bladder is unremarkable. Stomach/Bowel: Stomach is within normal limits. Appendix appears normal. No evidence of bowel wall thickening, distention, or inflammatory changes. Vascular/Lymphatic: Moderate aortic atherosclerosis. No aneurysm. No suspicious nodes. Reproductive: Uterus and bilateral adnexa are unremarkable. Other: Negative for free air or free fluid Musculoskeletal: No acute osseous abnormality IMPRESSION: No CT evidence for acute intra-abdominopelvic abnormality Electronically Signed   By: Jasmine PangKim  Fujinaga M.D.   On: 10/23/2020 21:35   DG Chest Portable 1 View  Result Date: 10/23/2020 CLINICAL DATA:  Altered level of consciousness, history of dimension EXAM: PORTABLE CHEST 1 VIEW COMPARISON:  10/15/2020 FINDINGS: Single frontal view of the chest demonstrates a stable cardiac silhouette. No airspace disease, effusion, or pneumothorax. No acute bony abnormalities. IMPRESSION: 1. Stable chest, no acute process. Electronically Signed   By: Sharlet SalinaMichael  Brown M.D.   On: 10/23/2020 17:56        Scheduled Meds:  clonazePAM  0.5 mg Oral TID   enoxaparin (LOVENOX) injection  40 mg Subcutaneous Q24H   insulin aspart  0-15 Units Subcutaneous TID AC & HS   insulin glargine-yfgn  7 Units Subcutaneous Daily   levothyroxine  50 mcg Oral QAC breakfast   lisinopril  2.5 mg Oral q morning   QUEtiapine  50 mg Oral QHS   simvastatin  20 mg Oral QHS   Continuous Infusions:  meropenem (MERREM) IV 1 g (10/25/20 0647)     LOS: 2 days   Time spent: 25mins Greater than 50% of this time was spent in counseling, explanation of diagnosis, planning of further management, and coordination of care.   Joseph ArtJessica U Duaa Stelzner, DO [facility]  Available via Epic  secure chat 7am-7pm for nonurgent issues Please page for urgent issues To page the attending provider between 7A-7P or the covering provider during after hours 7P-7A, please log into the web site [email or web address] and access using universal [place] password for that web site. If you do not have the password, please call the hospital operator.    10/25/2020, 11:59 AM

## 2020-10-25 NOTE — TOC Progression Note (Signed)
Transition of Care [facility]) - Progression Note    Patient Details  Name: Merian Wroe MRN: [id number] Date of Birth: 08/29/1950  [facility]) CM/SW Contact  Geni Bers, RN Phone Number: 10/25/2020, 12:23 PM  Clinical Narrative:    Spoke with pt's sister Wallene Huh concerning pt discharge back to [facility] in the AM. Wallene Huh was okay with pt returning and asked that MD call her for update on pt. [facility] was called and left VM for [place] side RN.   Expected Discharge Plan: Assisted Living Barriers to Discharge: No Barriers Identified  Expected Discharge Plan and Services Expected Discharge Plan: Assisted Living       Living arrangements for the past 2 months: Assisted Living Facility                                       Social Determinants of Health (SDOH) Interventions    Readmission Risk Interventions No flowsheet data found.

## 2020-10-25 NOTE — Progress Notes (Signed)
Pt has not shown any signs of getting sleepy, continues to  be loud with her vocolization of     " A Choo". This is disturbing patients in rooms near her even with doors closed

## 2020-10-26 ENCOUNTER — Encounter (HOSPITAL_COMMUNITY): Payer: Self-pay | Admitting: Internal Medicine

## 2020-10-26 LAB — GLUCOSE, CAPILLARY
Glucose-Capillary: 142 mg/dL — ABNORMAL HIGH (ref 70–99)
Glucose-Capillary: 251 mg/dL — ABNORMAL HIGH (ref 70–99)
Glucose-Capillary: 110 mg/dL — ABNORMAL HIGH (ref 70–99)
Glucose-Capillary: 121 mg/dL — ABNORMAL HIGH (ref 70–99)

## 2020-10-26 LAB — URINE CULTURE: Culture: 80000 — AB

## 2020-10-26 MED ORDER — FOSFOMYCIN TROMETHAMINE 3 G PO PACK
3.0000 g | PACK | Freq: Once | ORAL | Status: AC
  Administered 2020-10-26: 3 g via ORAL
  Filled 2020-10-26: qty 3

## 2020-10-26 MED ORDER — QUETIAPINE FUMARATE 50 MG PO TABS: 50.0000 mg | ORAL_TABLET | Freq: Every day | ORAL | Status: DC

## 2020-10-26 MED ORDER — LANTUS SOLOSTAR 100 UNIT/ML ~~LOC~~ SOPN: 10.0000 [IU] | PEN_INJECTOR | SUBCUTANEOUS | 11 refills | Status: DC

## 2020-10-26 NOTE — TOC Progression Note (Signed)
Transition of Care [facility]) - Progression Note    Patient Details  Name: Alexis Becker MRN: [id number] Date of Birth: 04/03/1950  [facility]) CM/SW Contact  Geni Bers, RN Phone Number: 10/26/2020, 12:20 PM  Clinical Narrative:     [name] was called.    Expected Discharge Plan: Assisted Living Barriers to Discharge: No Barriers Identified  Expected Discharge Plan and Services Expected Discharge Plan: Assisted Living       Living arrangements for the past 2 months: Assisted Living Facility Expected Discharge Date: 10/26/20                                     Social Determinants of Health (SDOH) Interventions    Readmission Risk Interventions No flowsheet data found.

## 2020-10-26 NOTE — Discharge Summary (Addendum)
Physician Discharge Summary  Alexis Becker YOM:[id number] DOB: 12-02-1950 DOA: 10/23/2020  PCP: Ashley Royalty, NP  Admit date: 10/23/2020 Discharge date: 10/26/2020  Admitted From: ALF Discharge disposition: ALF   Recommendations for Outpatient Follow-Up:   Grows ESBL on culture-- treated with fosfomycin Home health PT   Discharge Diagnosis:   Principal Problem:   Acute metabolic encephalopathy Active Problems:   Urinary tract infection due to extended-spectrum beta lactamase (ESBL) producing Escherichia coli   Hypothyroidism   Essential hypertension   Frontotemporal dementia with behavioral disturbance (HCC)   History of alcohol abuse   Wernicke-Korsakoff syndrome (HCC)   Hx of BKA, left (HCC)   Mixed diabetic hyperlipidemia associated with type 2 diabetes mellitus (HCC)   Pressure injury of skin    Discharge Condition: Improved.  Diet recommendation:  Carbohydrate-modified vs Regular.  Wound care: cover wounds on sacrum  Code status: DNR   History of Present Illness:   70 year old female with past medical history of frontotemporal dementia, Warnicke Korsakoff syndrome, hypertension, hyperlipidemia, hypothyroidism, insulin-dependent diabetes mellitus type 2, left BKA (S/P acute limb ischemia) and remote history of alcohol abuse currently in remission who presents to [facility] emergency department due to progressively worsening lethargy and confusion.  Patient is an extremely poor historian due to advanced dementia with superimposed encephalopathy.  Majority the history has been via discussion with emergency department staff, review of triage notes and discussion with the sister who is power of attorney via phone conversation.   On 9/2 the patient experienced a fall while at her facility.  Patient was brought in to the emergency department at that time for evaluation.  Urinalysis was suggestive of a urinary tract infection which was felt to  possibly be a cause of the fall and therefore patient was sent back to [place] on a course of oral Keflex.  In the days that followed, despite the patient taking this regimen of Keflex the patient became more and more lethargic day by day.  This was associated with worsening oral intake and worsening confusion beyond her baseline.  The sister explains that when she went to see her sister around noon on 9/10 she found her sister "slumped over in her chair" prompting the patient to be brought into [facility] emergency department for evaluation via EMS.   Upon further questioning of the sister, she reports that the patient has had several UTIs over the summer and "never seemed to get over them."   Upon evaluation in the emergency department.  Urinalysis was performed and found to be suggestive of a urinary tract infection.  When urine culture from 9/2 was reviewed, it turns out patient has multidrug-resistant E. coli only sensitive to Zosyn and carbapenems, suggesting that the Keflex patient was originally sent home on on 9/2 was certainly ineffective.  Patient was noted to have a somewhat tender abdomen so CT imaging of the abdomen pelvis was performed which revealed no evidence of stone or pyelonephritis.  Patient was administered intravenous meropenem.  The hospitalist group was then called to assess the patient for admission to the hospital.   Hospital Course by Problem:   Acute metabolic encephalopathy likely due to multidrug-resistant E. coli UTI Blood culture NGTD -Responded to meropenem -dose of fosfomycin prior to d/c   Hypomagnesemia Replace mag   Insulin-dependent type 2 diabetes, controlled A1c 6.1 Continue insulin   Hypertension  -continue lisinopril   Hyperlipidemia  -continue Zocor   Hypothyroidism  -continue Synthroid  Advanced frontotemporal dementia -seems to be at baseline  From memory care/ALF -seroquel QHS   obesity The patient's BMI is: Body mass  index is 41.46 kg/m.Marland Kitchen     Skin Assessment:   Pressure Injury 10/24/20 Sacrum Right;Left;Mid;Bilateral Stage 2 -  Partial thickness loss of dermis presenting as a shallow open injury with a red, pink wound bed without slough. pressure sores on buttocks (Active)  10/24/20 0033  Location: Sacrum  Location Orientation: Right;Left;Mid;Bilateral  Staging: Stage 2 -  Partial thickness loss of dermis presenting as a shallow open injury with a red, pink wound bed without slough.  Wound Description (Comments): pressure sores on buttocks  Present on Admission: Yes       Medical Consultants:      Discharge Exam:   Vitals:   10/25/20 2112 10/26/20 0613  BP: 119/62 120/76  Pulse: 84 84  Resp: 20 18  Temp: 98.2 F (36.8 C) 98.1 F (36.7 C)  SpO2: 98% 97%   Vitals:   10/25/20 0534 10/25/20 1230 10/25/20 2112 10/26/20 0613  BP: 123/62 128/76 119/62 120/76  Pulse: 71 83 84 84  Resp: 18 20 20 18   Temp: 98.8 F (37.1 C) 98.7 F (37.1 C) 98.2 F (36.8 C) 98.1 F (36.7 C)  TempSrc: Axillary Oral Oral Oral  SpO2: 100% 97% 98% 97%  Weight:      Height:        General exam: Appears calm and comfortable. Smiles and nods head  The results of significant diagnostics from this hospitalization (including imaging, microbiology, ancillary and laboratory) are listed below for reference.     Procedures and Diagnostic Studies:   CT ABDOMEN PELVIS W CONTRAST  Result Date: 10/23/2020 CLINICAL DATA:  Right lower quadrant pain EXAM: CT ABDOMEN AND PELVIS WITH CONTRAST TECHNIQUE: Multidetector CT imaging of the abdomen and pelvis was performed using the standard protocol following bolus administration of intravenous contrast. CONTRAST:  86mL OMNIPAQUE IOHEXOL 350 MG/ML SOLN COMPARISON:  None. FINDINGS: Lower chest: Lung bases demonstrate mild sub pleural reticulation. No acute consolidation or effusion. Coronary vascular calcification. Hepatobiliary: No focal liver abnormality is seen. No  gallstones, gallbladder wall thickening, or biliary dilatation. Pancreas: Unremarkable. No pancreatic ductal dilatation or surrounding inflammatory changes. Spleen: Normal in size without focal abnormality. Adrenals/Urinary Tract: Adrenal glands are normal. Kidneys show no hydronephrosis. Cyst in the midpole right kidney. The bladder is unremarkable. Stomach/Bowel: Stomach is within normal limits. Appendix appears normal. No evidence of bowel wall thickening, distention, or inflammatory changes. Vascular/Lymphatic: Moderate aortic atherosclerosis. No aneurysm. No suspicious nodes. Reproductive: Uterus and bilateral adnexa are unremarkable. Other: Negative for free air or free fluid Musculoskeletal: No acute osseous abnormality IMPRESSION: No CT evidence for acute intra-abdominopelvic abnormality Electronically Signed   By: 91m M.D.   On: 10/23/2020 21:35   DG Chest Portable 1 View  Result Date: 10/23/2020 CLINICAL DATA:  Altered level of consciousness, history of dimension EXAM: PORTABLE CHEST 1 VIEW COMPARISON:  10/15/2020 FINDINGS: Single frontal view of the chest demonstrates a stable cardiac silhouette. No airspace disease, effusion, or pneumothorax. No acute bony abnormalities. IMPRESSION: 1. Stable chest, no acute process. Electronically Signed   By: 12/15/2020 M.D.   On: 10/23/2020 17:56     Labs:   Basic Metabolic Panel: Recent Labs  Lab 10/23/20 1813 10/24/20 0232 10/25/20 0453  NA 145 140 134*  K 4.3 3.9 4.3  CL 111 106 104  CO2 24 25 21*  GLUCOSE 123* 131* 109*  BUN 28* 26*  18  CREATININE 0.62 0.70 0.67  CALCIUM 10.3 9.6 8.4*  MG  --  1.6* 1.9   GFR Estimated Creatinine Clearance: 84.4 mL/min (by C-G formula based on SCr of 0.67 mg/dL). Liver Function Tests: Recent Labs  Lab 10/23/20 1813 10/24/20 0232  AST 17 13*  ALT 16 16  ALKPHOS 55 56  BILITOT 0.7 0.6  PROT 7.0 6.7  ALBUMIN 3.1* 3.0*   No results for input(s): LIPASE, AMYLASE in the last 168  hours. Recent Labs  Lab 10/24/20 0040  AMMONIA 21   Coagulation profile No results for input(s): INR, PROTIME in the last 168 hours.  CBC: Recent Labs  Lab 10/23/20 1813 10/24/20 0232 10/25/20 0453  WBC 12.5* 12.1* 10.0  NEUTROABS  --  9.3*  --   HGB 10.2* 10.7* 10.6*  HCT 32.2* 33.4* 32.7*  MCV 86.3 86.5 84.7  PLT 308 319 312   Cardiac Enzymes: No results for input(s): CKTOTAL, CKMB, CKMBINDEX, TROPONINI in the last 168 hours. BNP: Invalid input(s): POCBNP CBG: Recent Labs  Lab 10/25/20 1221 10/25/20 1655 10/25/20 1719 10/25/20 2308 10/26/20 0839  GLUCAP 126* 154* 151* 155* 142*   D-Dimer No results for input(s): DDIMER in the last 72 hours. Hgb A1c Recent Labs    10/24/20 0232  HGBA1C 6.1*   Lipid Profile No results for input(s): CHOL, HDL, LDLCALC, TRIG, CHOLHDL, LDLDIRECT in the last 72 hours. Thyroid function studies Recent Labs    10/24/20 0040  TSH 0.936   Anemia work up Recent Labs    10/24/20 0040  VITAMINB12 1,021*  FOLATE 11.2   Microbiology Recent Results (from the past 240 hour(s))  Urine Culture     Status: Abnormal   Collection Time: 10/23/20  5:27 PM   Specimen: Urine, Clean Catch  Result Value Ref Range Status   Specimen Description   Final    URINE, CLEAN CATCH Performed at [facility], [address], [place], [place] [address]    Special Requests   Final    NONE Performed at [facility], [address] [place], [place] [address]    Culture (A)  Final    80,000 COLONIES/mL ESCHERICHIA COLI Confirmed Extended Spectrum Beta-Lactamase Producer (ESBL).  In bloodstream infections from ESBL organisms, carbapenems are preferred over piperacillin/tazobactam. They are shown to have a lower risk of mortality.    Report Status 10/26/2020 FINAL  Final   Organism ID, Bacteria ESCHERICHIA COLI (A)  Final      Susceptibility   Escherichia coli - MIC*    AMPICILLIN >=32 RESISTANT Resistant      CEFAZOLIN >=64 RESISTANT Resistant     CEFEPIME >=32 RESISTANT Resistant     CEFTRIAXONE >=64 RESISTANT Resistant     CIPROFLOXACIN >=4 RESISTANT Resistant     GENTAMICIN >=16 RESISTANT Resistant     IMIPENEM <=0.25 SENSITIVE Sensitive     NITROFURANTOIN 128 RESISTANT Resistant     TRIMETH/SULFA >=320 RESISTANT Resistant     AMPICILLIN/SULBACTAM >=32 RESISTANT Resistant     PIP/TAZO 64 INTERMEDIATE Intermediate     * 80,000 COLONIES/mL ESCHERICHIA COLI  Resp Panel by RT-PCR (Flu A&B, Covid) Nasopharyngeal Swab     Status: None   Collection Time: 10/23/20  9:23 PM   Specimen: Nasopharyngeal Swab; Nasopharyngeal(NP) swabs in vial transport medium  Result Value Ref Range Status   SARS Coronavirus 2 by RT PCR NEGATIVE NEGATIVE Final    Comment: (NOTE) SARS-CoV-2 target nucleic acids are NOT DETECTED.  The SARS-CoV-2 RNA  is generally detectable in upper respiratory specimens during the acute phase of infection. The lowest concentration of SARS-CoV-2 viral copies this assay can detect is 138 copies/mL. A negative result does not preclude SARS-Cov-2 infection and should not be used as the sole basis for treatment or other patient management decisions. A negative result may occur with  improper specimen collection/handling, submission of specimen other than nasopharyngeal swab, presence of viral mutation(s) within the areas targeted by this assay, and inadequate number of viral copies(<138 copies/mL). A negative result must be combined with clinical observations, patient history, and epidemiological information. The expected result is Negative.  Fact Sheet for Patients:  BloggerCourse.com  Fact Sheet for Healthcare Providers:  SeriousBroker.it  This test is no t yet approved or cleared by the [place] FDA and  has been authorized for detection and/or diagnosis of SARS-CoV-2 by FDA under an Emergency Use Authorization (EUA). This  EUA will remain  in effect (meaning this test can be used) for the duration of the COVID-19 declaration under Section 564(b)(1) of the Act, 21 U.S.C.section 360bbb-3(b)(1), unless the authorization is terminated  or revoked sooner.       Influenza A by PCR NEGATIVE NEGATIVE Final   Influenza B by PCR NEGATIVE NEGATIVE Final    Comment: (NOTE) The Xpert Xpress SARS-CoV-2/FLU/RSV plus assay is intended as an aid in the diagnosis of influenza from Nasopharyngeal swab specimens and should not be used as a sole basis for treatment. Nasal washings and aspirates are unacceptable for Xpert Xpress SARS-CoV-2/FLU/RSV testing.  Fact Sheet for Patients: BloggerCourse.com  Fact Sheet for Healthcare Providers: SeriousBroker.it  This test is not yet approved or cleared by the [place] FDA and has been authorized for detection and/or diagnosis of SARS-CoV-2 by FDA under an Emergency Use Authorization (EUA). This EUA will remain in effect (meaning this test can be used) for the duration of the COVID-19 declaration under Section 564(b)(1) of the Act, 21 U.S.C. section 360bbb-3(b)(1), unless the authorization is terminated or revoked.  Performed at [facility], [address], [place], [place] [address]   Culture, blood (routine x 2)     Status: None (Preliminary result)   Collection Time: 10/24/20 12:40 AM   Specimen: BLOOD RIGHT ARM  Result Value Ref Range Status   Specimen Description   Final    BLOOD RIGHT ARM Performed at [facility], [address], [place], [place] [address]    Special Requests   Final    BOTTLES DRAWN AEROBIC ONLY Blood Culture adequate volume Performed at [facility], [address], [place], [place] [address]    Culture   Final    NO GROWTH 2 DAYS Performed at [facility], [address] [place], [place] [address]    Report Status PENDING  Incomplete   Culture, blood (routine x 2)     Status: None (Preliminary result)   Collection Time: 10/24/20 12:40 AM   Specimen: BLOOD  Result Value Ref Range Status   Specimen Description   Final    BLOOD RIGHT WRIST Performed at [facility], [address] [place], [place] [address]    Special Requests   Final    BOTTLES DRAWN AEROBIC ONLY Blood Culture adequate volume Performed at [facility], [address] [place], [place] [address]    Culture   Final    NO GROWTH 2 DAYS Performed at [facility], [address] [place], [place]  [address]    Report Status PENDING  Incomplete  MRSA Next Gen by PCR, Nasal     Status: None   Collection Time: 10/24/20  5:03 PM   Specimen: Nasal Mucosa; Nasal Swab  Result Value Ref Range Status   MRSA by PCR Next Gen NOT DETECTED NOT DETECTED Final    Comment: (NOTE) The GeneXpert MRSA Assay (FDA approved for NASAL specimens only), is one component of a comprehensive MRSA colonization surveillance program. It is not intended to diagnose MRSA infection nor to guide or monitor treatment for MRSA infections. Test performance is not FDA approved in patients less than 31 years old. Performed at [facility], [address], [place], [place] [address]      Discharge Instructions:   Discharge Instructions     Diet Carb Modified   Complete by: As directed    Discharge wound care:   Complete by: As directed    Cover pressure injury on sacrum   Increase activity slowly   Complete by: As directed       Allergies as of 10/26/2020   No Known Allergies      Medication List     STOP taking these medications    cephALEXin 500 MG capsule Commonly known as: KEFLEX   pioglitazone 15 MG tablet Commonly known as: ACTOS   traZODone 50 MG tablet Commonly known as: DESYREL   vitamin B-12 500 MCG tablet Commonly known as: CYANOCOBALAMIN       TAKE these medications     acetaminophen 500 MG tablet Commonly known as: TYLENOL Take 1,000 mg by mouth 2 (two) times daily as needed for fever (pain).   busPIRone 7.5 MG tablet Commonly known as: BUSPAR Take 7.5 mg by mouth 2 (two) times daily.   calcium carbonate 600 MG Tabs tablet Commonly known as: OS-CAL Take 600 mg by mouth 2 (two) times daily.   citalopram 20 MG tablet Commonly known as: CELEXA Take 20 mg by mouth every morning.   clonazePAM 0.5 MG tablet Commonly known as: KLONOPIN Take 0.5 mg by mouth 3 (three) times daily.   Docusate Sodium 100 MG capsule Take 100 mg by mouth 2 (two) times daily.   guaiFENesin 100 MG/5ML liquid Commonly known as: ROBITUSSIN Take 200 mg by mouth every 4 (four) hours as needed for cough or congestion.   Healthy Eyes/Lutein-Zeaxanthin Caps Take 1 capsule by mouth every morning.   Lantus SoloStar 100 UNIT/ML Solostar Pen Generic drug: insulin glargine Inject 10 Units into the skin See admin instructions. Inject 10 units subcutaneously every morning - hold for CBG <110 What changed:  how much to take additional instructions   levothyroxine 50 MCG tablet Commonly known as: SYNTHROID Take 50 mcg by mouth daily before breakfast.   lisinopril 2.5 MG tablet Commonly known as: ZESTRIL Take 2.5 mg by mouth every morning.   loperamide 2 MG tablet Commonly known as: IMODIUM A-D Take 2 mg by mouth 3 (three) times daily as needed for diarrhea or loose stools.   melatonin 3 MG Tabs tablet Take 3 mg by mouth at bedtime.   metFORMIN 1000 MG tablet Commonly known as: GLUCOPHAGE Take 1,000 mg by mouth 2 (two) times daily.   Nyamyc powder Generic drug: nystatin Apply 1 application topically 2 (two) times daily as needed (groin rash).   polyethylene glycol powder 17 GM/SCOOP powder Commonly known as: GLYCOLAX/MIRALAX Take 17 g by mouth daily as needed (constipation).   QUEtiapine 50 MG tablet Commonly known as: SEROQUEL  Take 1 tablet (50 mg total) by  mouth at bedtime. What changed:  when to take this additional instructions   Salonpas 3.02-19-08 % Ptch Generic drug: Camphor-Menthol-Methyl Sal Place 1 patch onto the skin See admin instructions. Apply one patch transdermally to left aka stump daily as needed for pain   simvastatin 20 MG tablet Commonly known as: ZOCOR Take 20 mg by mouth at bedtime.   sodium chloride 1 g tablet Take 1 g by mouth 2 (two) times daily.   thiamine 100 MG tablet Take 100 mg by mouth every morning. Vitamin B1   zinc oxide 20 % ointment Apply 1 application topically 3 (three) times daily as needed (apply to buttocks for redness).               Discharge Care Instructions  (From admission, onward)           Start     Ordered   10/26/20 0000  Discharge wound care:       Comments: Cover pressure injury on sacrum   10/26/20 1023            Follow-up Information     Ashley Royalty, NP Follow up in 1 week(s).   Specialty: Adult Health Nurse Practitioner Contact information: [address] [place] [address] [phone]                  Time coordinating discharge: 35 min  Signed:  Joseph Art DO  [facility] 10/26/2020, 10:25 AM

## 2020-10-26 NOTE — NC FL2 (Signed)
[place] MEDICAID FL2 LEVEL OF CARE SCREENING TOOL     IDENTIFICATION  Patient Name: Alexis Becker Birthdate: July 31, 1950 Sex: female Admission Date (Current Location): 10/23/2020  [facility] and [place] Number:  Producer, television/film/video and Address:  [facility],  [address] [place], [place] [address]      Provider Number: [id number]  Attending Physician Name and Address:  Joseph Art, DO  Relative Name and Phone Number:  Tandy Gaw   [phone]    Current Level of Care: Hospital Recommended Level of Care: Assisted Living Facility, Memory Care Prior Approval Number:    Date Approved/Denied:   PASRR Number:    Discharge Plan: Other (Comment) (ALF/Memory Care)    Current Diagnoses: Patient Active Problem List   Diagnosis Date Noted   Pressure injury of skin 10/25/2020   Urinary tract infection due to extended-spectrum beta lactamase (ESBL) producing Escherichia coli 10/24/2020   Hypothyroidism 10/24/2020   Essential hypertension 10/24/2020   Frontotemporal dementia with behavioral disturbance (HCC) 10/24/2020   History of alcohol abuse 10/24/2020   Wernicke-Korsakoff syndrome (HCC) 10/24/2020   Hx of BKA, left (HCC) 10/24/2020   Mixed diabetic hyperlipidemia associated with type 2 diabetes mellitus (HCC) 10/24/2020   Acute metabolic encephalopathy 10/23/2020   Thrombocytopenia (HCC) 01/24/2020    Orientation RESPIRATION BLADDER Height & Weight     Self  Normal Incontinent Weight: 113 kg Height:  5\' 5"  (165.1 cm)  BEHAVIORAL SYMPTOMS/MOOD NEUROLOGICAL BOWEL NUTRITION STATUS      Incontinent Diet (Regular)  AMBULATORY STATUS COMMUNICATION OF NEEDS Skin   Extensive Assist Verbally Other (Comment) (Stage 2 right buttock)                       Personal Care Assistance Level of Assistance  Bathing, Feeding, Dressing Bathing Assistance: Maximum assistance Feeding assistance: Maximum assistance Dressing Assistance: Maximum assistance      Functional Limitations Info  Sight, Hearing, Speech Sight Info: Adequate Hearing Info: Adequate Speech Info: Impaired    SPECIAL CARE FACTORS FREQUENCY  PT (By licensed PT), OT (By licensed OT)     PT Frequency: Eval and Treat OT Frequency: Eval and Treat            Contractures Contractures Info: Not present    Additional Factors Info  Code Status, Allergies Code Status Info: DNR Allergies Info: No Known Allergies           Current Medications (10/26/2020):  This is the current hospital active medication list Current Facility-Administered Medications  Medication Dose Route Frequency Provider Last Rate Last Admin   acetaminophen (TYLENOL) tablet 650 mg  650 mg Oral Q6H PRN 10/28/2020, MD   650 mg at 10/24/20 2158   Or   acetaminophen (TYLENOL) suppository 650 mg  650 mg Rectal Q6H PRN 2159, MD       clonazePAM Marinda Elk) tablet 0.5 mg  0.5 mg Oral TID Vann, Jessica U, DO   0.5 mg at 10/26/20 0913   enoxaparin (LOVENOX) injection 40 mg  40 mg Subcutaneous Q24H Shalhoub, 10/28/20, MD   40 mg at 10/26/20 0913   fosfomycin (MONUROL) packet 3 g  3 g Oral Once Vann, Jessica U, DO       hydrALAZINE (APRESOLINE) injection 10 mg  10 mg Intravenous Q6H PRN Shalhoub, 05-11-1993, MD       insulin aspart (novoLOG) injection 0-15 Units  0-15 Units Subcutaneous TID AC & HS Shalhoub, 04-14-1984, MD  2 Units at 10/26/20 0913   insulin glargine-yfgn (SEMGLEE) injection 7 Units  7 Units Subcutaneous Daily Shalhoub, Deno Lunger, MD   7 Units at 10/26/20 0914   levothyroxine (SYNTHROID) tablet 50 mcg  50 mcg Oral QAC breakfast Albertine Grates, MD   50 mcg at 10/26/20 0548   lisinopril (ZESTRIL) tablet 2.5 mg  2.5 mg Oral q morning Albertine Grates, MD   2.5 mg at 10/26/20 0912   ondansetron (ZOFRAN) tablet 4 mg  4 mg Oral Q6H PRN Marinda Elk, MD       Or   ondansetron [facility]) injection 4 mg  4 mg Intravenous Q6H PRN Shalhoub, Deno Lunger, MD       polyethylene glycol (MIRALAX /  GLYCOLAX) packet 17 g  17 g Oral Daily PRN Shalhoub, Deno Lunger, MD       QUEtiapine (SEROQUEL) tablet 50 mg  50 mg Oral QHS Vann, Jessica U, DO   50 mg at 10/25/20 2255   simvastatin (ZOCOR) tablet 20 mg  20 mg Oral QHS Albertine Grates, MD   20 mg at 10/25/20 2251     Discharge Medications: Please see discharge summary for a list of discharge medications.  Relevant Imaging Results:  Relevant Lab Results:   Additional Information SS#[id number]  Geni Bers, RN

## 2020-10-27 DIAGNOSIS — M6281 Muscle weakness (generalized): Secondary | ICD-10-CM | POA: Diagnosis not present

## 2020-10-27 DIAGNOSIS — R296 Repeated falls: Secondary | ICD-10-CM | POA: Diagnosis not present

## 2020-10-27 DIAGNOSIS — R2689 Other abnormalities of gait and mobility: Secondary | ICD-10-CM | POA: Diagnosis not present

## 2020-10-28 DIAGNOSIS — R2689 Other abnormalities of gait and mobility: Secondary | ICD-10-CM | POA: Diagnosis not present

## 2020-10-28 DIAGNOSIS — M6281 Muscle weakness (generalized): Secondary | ICD-10-CM | POA: Diagnosis not present

## 2020-10-28 DIAGNOSIS — R296 Repeated falls: Secondary | ICD-10-CM | POA: Diagnosis not present

## 2020-10-29 DIAGNOSIS — M6281 Muscle weakness (generalized): Secondary | ICD-10-CM | POA: Diagnosis not present

## 2020-10-29 DIAGNOSIS — R296 Repeated falls: Secondary | ICD-10-CM | POA: Diagnosis not present

## 2020-10-29 DIAGNOSIS — R2689 Other abnormalities of gait and mobility: Secondary | ICD-10-CM | POA: Diagnosis not present

## 2020-10-29 LAB — CULTURE, BLOOD (ROUTINE X 2)
Culture: NO GROWTH
Culture: NO GROWTH
Special Requests: ADEQUATE
Special Requests: ADEQUATE

## 2020-11-01 DIAGNOSIS — Z993 Dependence on wheelchair: Secondary | ICD-10-CM | POA: Diagnosis not present

## 2020-11-01 DIAGNOSIS — B351 Tinea unguium: Secondary | ICD-10-CM | POA: Diagnosis not present

## 2020-11-01 DIAGNOSIS — M6281 Muscle weakness (generalized): Secondary | ICD-10-CM | POA: Diagnosis not present

## 2020-11-01 DIAGNOSIS — Q845 Enlarged and hypertrophic nails: Secondary | ICD-10-CM | POA: Diagnosis not present

## 2020-11-01 DIAGNOSIS — F5101 Primary insomnia: Secondary | ICD-10-CM | POA: Diagnosis not present

## 2020-11-01 DIAGNOSIS — R2689 Other abnormalities of gait and mobility: Secondary | ICD-10-CM | POA: Diagnosis not present

## 2020-11-01 DIAGNOSIS — R296 Repeated falls: Secondary | ICD-10-CM | POA: Diagnosis not present

## 2020-11-01 DIAGNOSIS — I10 Essential (primary) hypertension: Secondary | ICD-10-CM | POA: Diagnosis not present

## 2020-11-02 DIAGNOSIS — R2689 Other abnormalities of gait and mobility: Secondary | ICD-10-CM | POA: Diagnosis not present

## 2020-11-02 DIAGNOSIS — R296 Repeated falls: Secondary | ICD-10-CM | POA: Diagnosis not present

## 2020-11-02 DIAGNOSIS — M6281 Muscle weakness (generalized): Secondary | ICD-10-CM | POA: Diagnosis not present

## 2020-11-03 DIAGNOSIS — M6281 Muscle weakness (generalized): Secondary | ICD-10-CM | POA: Diagnosis not present

## 2020-11-03 DIAGNOSIS — R296 Repeated falls: Secondary | ICD-10-CM | POA: Diagnosis not present

## 2020-11-04 DIAGNOSIS — R296 Repeated falls: Secondary | ICD-10-CM | POA: Diagnosis not present

## 2020-11-04 DIAGNOSIS — M6281 Muscle weakness (generalized): Secondary | ICD-10-CM | POA: Diagnosis not present

## 2020-11-05 DIAGNOSIS — R2689 Other abnormalities of gait and mobility: Secondary | ICD-10-CM | POA: Diagnosis not present

## 2020-11-05 DIAGNOSIS — M6281 Muscle weakness (generalized): Secondary | ICD-10-CM | POA: Diagnosis not present

## 2020-11-05 DIAGNOSIS — R296 Repeated falls: Secondary | ICD-10-CM | POA: Diagnosis not present

## 2020-11-08 DIAGNOSIS — R34 Anuria and oliguria: Secondary | ICD-10-CM | POA: Diagnosis not present

## 2020-11-08 DIAGNOSIS — R296 Repeated falls: Secondary | ICD-10-CM | POA: Diagnosis not present

## 2020-11-08 DIAGNOSIS — M6281 Muscle weakness (generalized): Secondary | ICD-10-CM | POA: Diagnosis not present

## 2020-11-09 DIAGNOSIS — R296 Repeated falls: Secondary | ICD-10-CM | POA: Diagnosis not present

## 2020-11-09 DIAGNOSIS — M6281 Muscle weakness (generalized): Secondary | ICD-10-CM | POA: Diagnosis not present

## 2020-11-09 DIAGNOSIS — R2689 Other abnormalities of gait and mobility: Secondary | ICD-10-CM | POA: Diagnosis not present

## 2020-11-15 DIAGNOSIS — I10 Essential (primary) hypertension: Secondary | ICD-10-CM | POA: Diagnosis not present

## 2020-11-15 DIAGNOSIS — Z993 Dependence on wheelchair: Secondary | ICD-10-CM | POA: Diagnosis not present

## 2020-11-17 DIAGNOSIS — Z8616 Personal history of COVID-19: Secondary | ICD-10-CM | POA: Diagnosis not present

## 2020-12-02 ENCOUNTER — Non-Acute Institutional Stay (SKILLED_NURSING_FACILITY): Payer: Medicare Other | Admitting: Internal Medicine

## 2020-12-02 ENCOUNTER — Encounter: Payer: Self-pay | Admitting: Internal Medicine

## 2020-12-02 DIAGNOSIS — E039 Hypothyroidism, unspecified: Secondary | ICD-10-CM | POA: Diagnosis not present

## 2020-12-02 DIAGNOSIS — I10 Essential (primary) hypertension: Secondary | ICD-10-CM | POA: Diagnosis not present

## 2020-12-02 DIAGNOSIS — G9340 Encephalopathy, unspecified: Secondary | ICD-10-CM | POA: Diagnosis not present

## 2020-12-02 DIAGNOSIS — E785 Hyperlipidemia, unspecified: Secondary | ICD-10-CM

## 2020-12-02 DIAGNOSIS — G3109 Other frontotemporal dementia: Secondary | ICD-10-CM

## 2020-12-02 DIAGNOSIS — F04 Amnestic disorder due to known physiological condition: Secondary | ICD-10-CM

## 2020-12-02 DIAGNOSIS — N390 Urinary tract infection, site not specified: Secondary | ICD-10-CM | POA: Diagnosis not present

## 2020-12-02 DIAGNOSIS — F1011 Alcohol abuse, in remission: Secondary | ICD-10-CM

## 2020-12-02 DIAGNOSIS — F02818 Dementia in other diseases classified elsewhere, unspecified severity, with other behavioral disturbance: Secondary | ICD-10-CM | POA: Diagnosis not present

## 2020-12-02 NOTE — Progress Notes (Signed)
Provider:  Einar Crow MD Location:   [facility] Room Number: 29 Place of Service:  SNF (31)  PCP: Mahlon Gammon, MD Patient Care Team: Mahlon Gammon, MD as PCP - General (Internal Medicine)  Extended Emergency Contact Information Primary Emergency Contact: [facility] Phone: [phone] Relation: Sister Secondary Emergency Contact: [facility] Phone: [phone] Mobile Phone: [phone] Relation: Brother  Code Status: Managed care Goals of Care: Advanced Directive information Advanced Directives 12/02/2020  Does Patient Have a Medical Advance Directive? Yes  Type of Advance Directive Living will;Healthcare Power of Attorney  Does patient want to make changes to medical advance directive? No - Patient declined  Copy of Healthcare Power of Attorney in Chart? Yes - validated most recent copy scanned in chart (See row information)  Pre-existing out of facility DNR order (yellow form or pink MOST form) -      Chief Complaint  Patient presents with   New Admit To SNF    Admission to SNF    HPI: Patient is a 70 y.o. female seen today for admission to SNF for Long term care with Therapy  Patient unable to give much history due to her dementia.  Base of the history was obtained from her sister who is her POA.  Assisted patient was diagnosed with frontotemporal dementia in 2019. She presented with inability to take care of herself and psychosis. 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  But for past 4 weeks she has been getting weaker unable to take care of herself unable to do her ADLs.  Nonverbal.  Needing Hoyer lift for transfers Sister had her transferred to SNF for more care.  Patient has not had any fever nausea  vomiting or weight loss. Today seen in her room would respond to her name but is nonverbal followed few simple commands but then would just close her eyes. Had a lot of stiffness in her upper extremities.  Patient husband killed himself years ago. 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 She is retired Engineer, civil (consulting) Her sister is the POA  Past Medical History:  Diagnosis Date   Dementia (HCC)    Diabetes mellitus without complication (HCC)    Hx of BKA, left (HCC) 10/24/2020   Thyroid disease    History reviewed. No pertinent surgical history.  reports that she quit smoking about 3 years ago. Her smoking use included cigarettes. She started smoking about 42 years ago. She has a 40.00 pack-year smoking history. She has never used smokeless tobacco. No history on file for alcohol use and drug use. Social History   Socioeconomic History   Marital status: Widowed    Spouse name: Not on file   Number of children: Not on file   Years of education: Not on file   Highest education level: Not on file  Occupational History   Not on file  Tobacco Use   Smoking status: Former    Packs/day: 1.00    Years: 40.00    Pack years: 40.00    Types: Cigarettes    Start date: 41    Quit date: 2019    Years since quitting: 3.8   Smokeless tobacco: Never   Tobacco comments:    Per sister  Vaping Use   Vaping Use: Former  Substance and Sexual Activity   Alcohol use: Not on file   Drug use: Not on file   Sexual activity: Not on file  Other Topics Concern   Not on file  Social History Narrative   Not on file   Social Determinants of Health   Financial Resource Strain: Not on file  Food Insecurity: Not on file  Transportation Needs: Not on file  Physical Activity: Not on file  Stress: Not on file  Social Connections: Not on file  Intimate Partner Violence: Not on file    Functional Status Survey:    Family History  Problem Relation Age of Onset   Heart disease Neg  Hx     Health Maintenance  Topic Date Due   COVID-19 Vaccine (1) Never done   Pneumonia Vaccine 31+ Years old (1 - PCV) Never done   FOOT EXAM  Never done   OPHTHALMOLOGY EXAM  Never done   Hepatitis C Screening  Never done   TETANUS/TDAP  Never done   Zoster Vaccines- Shingrix (1 of 2) Never done   COLONOSCOPY (Pts 45-72yrs Insurance coverage will need to be confirmed)  Never done   MAMMOGRAM  Never done   DEXA SCAN  Never done   INFLUENZA VACCINE  09/13/2020   HEMOGLOBIN A1C  04/23/2021   HPV VACCINES  Aged Out    No Known Allergies  Allergies as of 12/02/2020   No Known Allergies      Medication List        Accurate as of December 02, 2020 11:59 PM. If you have any questions, ask your nurse or doctor.          acetaminophen 500 MG tablet Commonly known as: TYLENOL Take 1,000 mg by mouth 2 (two) times daily as needed for fever (pain).   busPIRone 5 MG tablet Commonly known as: BUSPAR Take 5 mg by mouth 2 (two) times daily.   calcium carbonate 600 MG Tabs tablet Commonly known as: OS-CAL Take 600 mg by mouth 2 (two) times daily.   citalopram 10 MG tablet Commonly known as: CELEXA Take 10 mg by mouth every morning.   clonazePAM 0.5 MG tablet Commonly known as: KLONOPIN Take 0.5 mg by mouth 3 (three) times daily.   Docusate Sodium 100 MG capsule Take 100 mg by mouth 2 (two) times daily.   guaiFENesin 100 MG/5ML liquid Commonly known as: ROBITUSSIN Take 200 mg by mouth every 4 (four) hours as needed for cough or congestion.   Healthy Eyes/Lutein-Zeaxanthin Caps Take 1 capsule by mouth every morning.   Lantus SoloStar 100 UNIT/ML Solostar Pen Generic drug: insulin glargine Inject 14 Units into the skin daily. What changed: Another medication with the same name was removed. Continue taking this medication, and follow the directions you see here. Changed by: Mahlon Gammon, MD   levothyroxine 50 MCG tablet Commonly known as: SYNTHROID Take 50 mcg  by mouth daily before breakfast.   lisinopril 2.5 MG tablet Commonly known as: ZESTRIL Take 2.5 mg by mouth every morning.   loperamide 2 MG tablet Commonly known as: IMODIUM A-D Take 2 mg by mouth 3 (three) times daily as needed for diarrhea or loose stools.   melatonin 3 MG Tabs tablet Take 3 mg by mouth at bedtime.   metFORMIN 1000 MG tablet Commonly known as: GLUCOPHAGE Take 1,000 mg by mouth 2 (two) times daily.   Nyamyc powder Generic drug: nystatin Apply 1 application topically 2 (two) times daily as needed (groin rash).  pioglitazone 15 MG tablet Commonly known as: ACTOS Take 15 mg by mouth daily.   polyethylene glycol powder 17 GM/SCOOP powder Commonly known as: GLYCOLAX/MIRALAX Take 17 g by mouth daily as needed (constipation).   QUEtiapine 50 MG tablet Commonly known as: SEROQUEL Take 50 mg by mouth 2 (two) times daily. What changed: Another medication with the same name was removed. Continue taking this medication, and follow the directions you see here. Changed by: Mahlon Gammon, MD   Salonpas 3.02-19-08 % Ptch Generic drug: Camphor-Menthol-Methyl Sal Place 1 patch onto the skin See admin instructions. Apply one patch transdermally to left aka stump daily as needed for pain   simvastatin 20 MG tablet Commonly known as: ZOCOR Take 20 mg by mouth at bedtime.   sodium chloride 1 g tablet Take 1 g by mouth 2 (two) times daily.   thiamine 100 MG tablet Take 100 mg by mouth every morning. Vitamin B1   traZODone 50 MG tablet Commonly known as: DESYREL Take 50 mg by mouth every 12 (twelve) hours as needed for sleep.   Tubersol 5 UNIT/0.1ML injection Generic drug: tuberculin Inject 5 Units into the skin once.   vitamin B-12 500 MCG tablet Commonly known as: CYANOCOBALAMIN Take 500 mcg by mouth daily.   zinc oxide 20 % ointment Apply 1 application topically 3 (three) times daily as needed (apply to buttocks for redness).        Review of Systems   Unable to perform ROS: Dementia   Vitals:   12/03/20 0910  BP: 132/77  Pulse: 74  Resp: 16  Temp: (!) 97.5 F (36.4 C)  SpO2: 97%  Weight: 173 lb 8 oz (78.7 kg)  Height: 5\' 7"  (1.702 m)   Body mass index is 27.17 kg/m. Physical Exam Vitals reviewed.  Constitutional:      Appearance: Normal appearance.  HENT:     Head: Normocephalic.     Nose: Nose normal.     Mouth/Throat:     Mouth: Mucous membranes are moist.     Pharynx: Oropharynx is clear.  Eyes:     Pupils: Pupils are equal, round, and reactive to light.  Cardiovascular:     Rate and Rhythm: Normal rate and regular rhythm.     Pulses: Normal pulses.     Heart sounds: Normal heart sounds. No murmur heard. Pulmonary:     Effort: Pulmonary effort is normal. No respiratory distress.     Breath sounds: Normal breath sounds. No wheezing.  Abdominal:     General: Abdomen is flat. Bowel sounds are normal.     Palpations: Abdomen is soft.  Musculoskeletal:        General: No swelling.     Cervical back: Neck supple.     Comments: S/P Left BKA  Skin:    General: Skin is warm.     Comments: Has 2 Pressure Ulcers in her Bottom Stage 2  Neurological:     Mental Status: She is alert.     Comments: Lethargic/Sleepy Would respond ot her name Smiles But does not follows any commands Very Stiff in her UE    Psychiatric:        Mood and Affect: Mood normal.        Thought Content: Thought content normal.    Labs reviewed: Basic Metabolic Panel: Recent Labs    10/23/20 1813 10/24/20 0232 10/25/20 0453  NA 145 140 134*  K 4.3 3.9 4.3  CL 111 106 104  CO2 24 25  21*  GLUCOSE 123* 131* 109*  BUN 28* 26* 18  CREATININE 0.62 0.70 0.67  CALCIUM 10.3 9.6 8.4*  MG  --  1.6* 1.9   Liver Function Tests: Recent Labs    03/17/20 0807 10/23/20 1813 10/24/20 0232  AST 19 17 13*  ALT 15 16 16   ALKPHOS 65 55 56  BILITOT 0.5 0.7 0.6  PROT 7.5 7.0 6.7  ALBUMIN 4.0 3.1* 3.0*   No results for input(s): LIPASE,  AMYLASE in the last 8760 hours. Recent Labs    10/24/20 0040  AMMONIA 21   CBC: Recent Labs    01/24/20 1359 01/24/20 1610 03/17/20 0807 10/15/20 2151 10/23/20 1813 10/24/20 0232 10/25/20 0453  WBC 3.2*  --  11.0*   < > 12.5* 12.1* 10.0  NEUTROABS 1.4*  --  8.5*  --   --  9.3*  --   HGB 11.8*  --  13.2   < > 10.2* 10.7* 10.6*  HCT 34.8*  --  40.8   < > 32.2* 33.4* 32.7*  MCV 85.5  --  87.4   < > 86.3 86.5 84.7  PLT 5*   < > 297   < > 308 319 312   < > = values in this interval not displayed.   Cardiac Enzymes: Recent Labs    03/17/20 0807  CKTOTAL 124   BNP: Invalid input(s): POCBNP Lab Results  Component Value Date   HGBA1C 6.1 (H) 10/24/2020   Lab Results  Component Value Date   TSH 0.936 10/24/2020   Lab Results  Component Value Date   VITAMINB12 1,021 (H) 10/24/2020   Lab Results  Component Value Date   FOLATE 11.2 10/24/2020   No results found for: IRON, TIBC, FERRITIN  Imaging and Procedures obtained prior to SNF admission: CT ABDOMEN PELVIS W CONTRAST  Result Date: 10/23/2020 CLINICAL DATA:  Right lower quadrant pain EXAM: CT ABDOMEN AND PELVIS WITH CONTRAST TECHNIQUE: Multidetector CT imaging of the abdomen and pelvis was performed using the standard protocol following bolus administration of intravenous contrast. CONTRAST:  87mL OMNIPAQUE IOHEXOL 350 MG/ML SOLN COMPARISON:  None. FINDINGS: Lower chest: Lung bases demonstrate mild sub pleural reticulation. No acute consolidation or effusion. Coronary vascular calcification. Hepatobiliary: No focal liver abnormality is seen. No gallstones, gallbladder wall thickening, or biliary dilatation. Pancreas: Unremarkable. No pancreatic ductal dilatation or surrounding inflammatory changes. Spleen: Normal in size without focal abnormality. Adrenals/Urinary Tract: Adrenal glands are normal. Kidneys show no hydronephrosis. Cyst in the midpole right kidney. The bladder is unremarkable. Stomach/Bowel: Stomach is within  normal limits. Appendix appears normal. No evidence of bowel wall thickening, distention, or inflammatory changes. Vascular/Lymphatic: Moderate aortic atherosclerosis. No aneurysm. No suspicious nodes. Reproductive: Uterus and bilateral adnexa are unremarkable. Other: Negative for free air or free fluid Musculoskeletal: No acute osseous abnormality IMPRESSION: No CT evidence for acute intra-abdominopelvic abnormality Electronically Signed   By: 89m M.D.   On: 10/23/2020 21:35   DG Chest Portable 1 View  Result Date: 10/23/2020 CLINICAL DATA:  Altered level of consciousness, history of dimension EXAM: PORTABLE CHEST 1 VIEW COMPARISON:  10/15/2020 FINDINGS: Single frontal view of the chest demonstrates a stable cardiac silhouette. No airspace disease, effusion, or pneumothorax. No acute bony abnormalities. IMPRESSION: 1. Stable chest, no acute process. Electronically Signed   By: 12/15/2020 M.D.   On: 10/23/2020 17:56    Assessment/Plan Encephalopathy ? Etiology  Per sister this is new since her discharge from [facility] 4 weeks ago for UTI  Will do CBC, CMP and UA with Culture Also will Stop the Meds she is on to see if it wakes up Slowly taper Buspar off Make Seroquel and Klonipin PRn If patient does not get better sister is ok to consider Palliative or Hospice  Frontotemporal dementia with behavioral disturbance (HCC) Needs more help with her ADLS  Wernicke-Korsakoff syndrome (HCC)  Recurrent UTI with h/o ESBL Check UA   Essential hypertension On Lisinopril   Hypothyroidism, unspecified type TSH normal in 9/22  History of alcohol abuse Continue Lexapro   Type 2 diabetes mellitus with diabetic neuropathy, with long-term current use of insulin (HCC) On Actos and Metformin and Insulin Will Check A1C CBG Q am  Hyperlipidemia,  On Statin  Pressure wound Use Zinc for now Foam for the open Area   Family/ staff Communication:   Labs/tests ordered: CBC,CMP,LIPId,A1C

## 2020-12-03 ENCOUNTER — Encounter: Payer: Self-pay | Admitting: Internal Medicine

## 2020-12-03 DIAGNOSIS — R2681 Unsteadiness on feet: Secondary | ICD-10-CM | POA: Diagnosis not present

## 2020-12-03 DIAGNOSIS — N390 Urinary tract infection, site not specified: Secondary | ICD-10-CM | POA: Diagnosis not present

## 2020-12-03 DIAGNOSIS — E119 Type 2 diabetes mellitus without complications: Secondary | ICD-10-CM | POA: Diagnosis not present

## 2020-12-03 DIAGNOSIS — Z9181 History of falling: Secondary | ICD-10-CM | POA: Diagnosis not present

## 2020-12-03 DIAGNOSIS — Z89612 Acquired absence of left leg above knee: Secondary | ICD-10-CM | POA: Diagnosis not present

## 2020-12-03 DIAGNOSIS — M6281 Muscle weakness (generalized): Secondary | ICD-10-CM | POA: Diagnosis not present

## 2020-12-03 DIAGNOSIS — R4789 Other speech disturbances: Secondary | ICD-10-CM | POA: Diagnosis not present

## 2020-12-06 ENCOUNTER — Non-Acute Institutional Stay (SKILLED_NURSING_FACILITY): Payer: Medicare Other | Admitting: Orthopedic Surgery

## 2020-12-06 ENCOUNTER — Encounter: Payer: Self-pay | Admitting: Orthopedic Surgery

## 2020-12-06 DIAGNOSIS — R4789 Other speech disturbances: Secondary | ICD-10-CM | POA: Diagnosis not present

## 2020-12-06 DIAGNOSIS — E785 Hyperlipidemia, unspecified: Secondary | ICD-10-CM | POA: Diagnosis not present

## 2020-12-06 DIAGNOSIS — K5901 Slow transit constipation: Secondary | ICD-10-CM | POA: Diagnosis not present

## 2020-12-06 DIAGNOSIS — I10 Essential (primary) hypertension: Secondary | ICD-10-CM

## 2020-12-06 DIAGNOSIS — R1310 Dysphagia, unspecified: Secondary | ICD-10-CM

## 2020-12-06 DIAGNOSIS — E1169 Type 2 diabetes mellitus with other specified complication: Secondary | ICD-10-CM

## 2020-12-06 DIAGNOSIS — G3109 Other frontotemporal dementia: Secondary | ICD-10-CM | POA: Diagnosis not present

## 2020-12-06 DIAGNOSIS — G9340 Encephalopathy, unspecified: Secondary | ICD-10-CM | POA: Diagnosis not present

## 2020-12-06 DIAGNOSIS — Z9181 History of falling: Secondary | ICD-10-CM | POA: Diagnosis not present

## 2020-12-06 DIAGNOSIS — F02818 Dementia in other diseases classified elsewhere, unspecified severity, with other behavioral disturbance: Secondary | ICD-10-CM

## 2020-12-06 DIAGNOSIS — R7303 Prediabetes: Secondary | ICD-10-CM | POA: Diagnosis not present

## 2020-12-06 DIAGNOSIS — E119 Type 2 diabetes mellitus without complications: Secondary | ICD-10-CM | POA: Diagnosis not present

## 2020-12-06 DIAGNOSIS — E039 Hypothyroidism, unspecified: Secondary | ICD-10-CM | POA: Diagnosis not present

## 2020-12-06 DIAGNOSIS — M6281 Muscle weakness (generalized): Secondary | ICD-10-CM | POA: Diagnosis not present

## 2020-12-06 DIAGNOSIS — N390 Urinary tract infection, site not specified: Secondary | ICD-10-CM | POA: Diagnosis not present

## 2020-12-06 DIAGNOSIS — Z89612 Acquired absence of left leg above knee: Secondary | ICD-10-CM | POA: Diagnosis not present

## 2020-12-06 DIAGNOSIS — R2681 Unsteadiness on feet: Secondary | ICD-10-CM | POA: Diagnosis not present

## 2020-12-06 NOTE — Progress Notes (Signed)
Location:   [facility] Room Number: 29-A Place of Service:  SNF [phone]) Provider:  Hazle Nordmann, NP  Patient Care Team: Mahlon Gammon, MD as PCP - General (Internal Medicine)  Extended Emergency Contact Information Primary Emergency Contact: [facility] Phone: [phone] Relation: Sister Secondary Emergency Contact: [facility] Phone: [phone] Mobile Phone: [phone] Relation: Brother  Code Status:  DNR Goals of care: Advanced Directive information Advanced Directives 12/06/2020  Does Patient Have a Medical Advance Directive? Yes  Type of Estate agent of [place];Living will;Out of facility DNR (pink MOST or yellow form)  Does patient want to make changes to medical advance directive? No - Patient declined  Copy of Healthcare Power of Attorney in Chart? Yes - validated most recent copy scanned in chart (See row information)  Pre-existing out of facility DNR order (yellow form or pink MOST form) -     Chief Complaint  Patient presents with   Acute Visit    Abnormal UA.    HPI:  Pt is a 70 y.o. female seen today for an acute visit for abnormal U/A.   She currently resides on the skilled nursing unit at [facility]. Past medical history includes: HTN, hypothyroidism, T2DM, frontotemporal dementia, wernicke-korsakoff syndrome, thrombocytopenia.   Recurrent UTI with ESBL- recently given fosfomycin and meropenem last hospitalization, UA/culture 10/212022 revealed 1+ occult blood/ 1+ leukocytes, urine culture negative for growth, sister reports a few UTI over this past year  Encephalopathy- recently hospitalized 09/10-09/13- suspected cause multidrug resistant UTI, blood cultures with no growth,WBC 10.0. 12/06/2020, sodium 136 12/06/2020, alert today eating breakfast during encounter Frontotemporal dementia- diagnosed in 2019, recently moved to skilled nursing since she was unable to care for self in [place],  nonverbal today, able to smile and follow simple command, no recent behaviors reported, remains on Seroquel and klonopin prn HTN- BUN/creat 18/0.67 10/25/2020, remains on lisinopril HLD- LDL 50 12/06/2020, remains on simvastatin Hypothyroidism- TSH 0.936 10/24/2020, remains on levothyroxine T2DM- A1c 6.1 12/06/2020, blood sugars 100-120's, no recent hypoglycemic events, remains on Lantus, Actos, and metformin Dysphagia- seen by ST today, finger foods recommended, reports she follows commands with direct cues/spoke one sentence to them  Constipation- LBM 10/23, remains on colace daily Past Medical History:  Diagnosis Date   Dementia (HCC)    Diabetes mellitus without complication (HCC)    Hx of BKA, left (HCC) 10/24/2020   Thyroid disease    History reviewed. No pertinent surgical history.  No Known Allergies  Allergies as of 12/06/2020   No Known Allergies      Medication List        Accurate as of December 06, 2020 10:31 AM. If you have any questions, ask your nurse or doctor.          STOP taking these medications    busPIRone 5 MG tablet Commonly known as: BUSPAR Stopped by: Octavia Heir, NP   polyethylene glycol powder 17 GM/SCOOP powder Commonly known as: GLYCOLAX/MIRALAX Stopped by: Octavia Heir, NP       TAKE these medications    acetaminophen 500 MG tablet Commonly known as: TYLENOL Take 1,000 mg by mouth 2 (two) times daily as needed for fever (pain).   calcium carbonate 600 MG Tabs tablet Commonly known as: OS-CAL Take 600 mg by mouth 2 (two) times daily.   citalopram 10 MG tablet Commonly known as: CELEXA Take 10 mg by mouth every morning.   clonazePAM 0.5 MG tablet Commonly known as: Scarlette Calico  Take 0.5 mg by mouth 2 (two) times daily as needed.   Docusate Sodium 100 MG capsule Take 100 mg by mouth 2 (two) times daily.   guaiFENesin 100 MG/5ML liquid Commonly known as: ROBITUSSIN Take 200 mg by mouth every 4 (four) hours as needed for cough  or congestion.   Healthy Eyes/Lutein-Zeaxanthin Caps Take 1 capsule by mouth every morning.   Lantus SoloStar 100 UNIT/ML Solostar Pen Generic drug: insulin glargine Inject 14 Units into the skin daily.   levothyroxine 50 MCG tablet Commonly known as: SYNTHROID Take 50 mcg by mouth daily before breakfast.   lisinopril 2.5 MG tablet Commonly known as: ZESTRIL Take 2.5 mg by mouth every morning.   loperamide 2 MG tablet Commonly known as: IMODIUM A-D Take 2 mg by mouth 3 (three) times daily as needed for diarrhea or loose stools.   melatonin 3 MG Tabs tablet Take 3 mg by mouth at bedtime.   metFORMIN 1000 MG tablet Commonly known as: GLUCOPHAGE Take 1,000 mg by mouth 2 (two) times daily.   Nyamyc powder Generic drug: nystatin Apply 1 application topically 2 (two) times daily as needed (groin rash).   pioglitazone 15 MG tablet Commonly known as: ACTOS Take 15 mg by mouth daily.   QUEtiapine 50 MG tablet Commonly known as: SEROQUEL Take 25 mg by mouth 2 (two) times daily as needed.   Salonpas 3.02-19-08 % Ptch Generic drug: Camphor-Menthol-Methyl Sal Place 1 patch onto the skin See admin instructions. Apply one patch transdermally to left aka stump daily as needed for pain   simvastatin 20 MG tablet Commonly known as: ZOCOR Take 20 mg by mouth at bedtime.   sodium chloride 1 g tablet Take 1 g by mouth 2 (two) times daily.   thiamine 100 MG tablet Take 100 mg by mouth every morning. Vitamin B1   traZODone 50 MG tablet Commonly known as: DESYREL Take 50 mg by mouth every 12 (twelve) hours as needed for sleep.   Tubersol 5 UNIT/0.1ML injection Generic drug: tuberculin Inject 5 Units into the skin once.   vitamin B-12 500 MCG tablet Commonly known as: CYANOCOBALAMIN Take 500 mcg by mouth daily.   zinc oxide 20 % ointment Apply 1 application topically 3 (three) times daily as needed (apply to buttocks for redness).        Review of Systems  Unable to  perform ROS: Dementia    There is no immunization history on file for this patient. Pertinent  Health Maintenance Due  Topic Date Due   FOOT EXAM  Never done   OPHTHALMOLOGY EXAM  Never done   COLONOSCOPY (Pts 45-66yrs Insurance coverage will need to be confirmed)  Never done   MAMMOGRAM  Never done   DEXA SCAN  Never done   INFLUENZA VACCINE  09/13/2020   HEMOGLOBIN A1C  04/23/2021   No flowsheet data found. Functional Status Survey:    Vitals:   12/06/20 1008  BP: 126/75  Pulse: 73  Resp: 18  Temp: 97.9 F (36.6 C)  SpO2: 96%  Weight: 173 lb 8 oz (78.7 kg)  Height: 5\' 7"  (1.702 m)   Body mass index is 27.17 kg/m. Physical Exam Vitals reviewed.  Constitutional:      General: She is not in acute distress. HENT:     Head: Normocephalic.     Right Ear: There is no impacted cerumen.     Left Ear: There is no impacted cerumen.     Nose: Nose normal.  Mouth/Throat:     Mouth: Mucous membranes are moist.  Eyes:     General:        Right eye: No discharge.        Left eye: No discharge.  Neck:     Vascular: No carotid bruit.  Cardiovascular:     Rate and Rhythm: Normal rate and regular rhythm.     Pulses: Normal pulses.     Heart sounds: Normal heart sounds. No murmur heard. Pulmonary:     Effort: Pulmonary effort is normal. No respiratory distress.     Breath sounds: Normal breath sounds. No wheezing.  Abdominal:     General: Bowel sounds are normal. There is no distension.     Palpations: Abdomen is soft.     Tenderness: There is no abdominal tenderness.  Musculoskeletal:     Cervical back: Normal range of motion.     Right lower leg: No edema.     Comments: Left BKA, no skin breakdown over stump  Lymphadenopathy:     Cervical: No cervical adenopathy.  Skin:    General: Skin is warm and dry.  Neurological:     General: No focal deficit present.     Mental Status: She is alert. Mental status is at baseline.     Motor: Weakness present.     Gait:  Gait abnormal.  Psychiatric:        Mood and Affect: Mood normal.        Behavior: Behavior normal.    Labs reviewed: Recent Labs    10/23/20 1813 10/24/20 0232 10/25/20 0453  NA 145 140 134*  K 4.3 3.9 4.3  CL 111 106 104  CO2 24 25 21*  GLUCOSE 123* 131* 109*  BUN 28* 26* 18  CREATININE 0.62 0.70 0.67  CALCIUM 10.3 9.6 8.4*  MG  --  1.6* 1.9   Recent Labs    03/17/20 0807 10/23/20 1813 10/24/20 0232  AST 19 17 13*  ALT 15 16 16   ALKPHOS 65 55 56  BILITOT 0.5 0.7 0.6  PROT 7.5 7.0 6.7  ALBUMIN 4.0 3.1* 3.0*   Recent Labs    01/24/20 1359 01/24/20 1610 03/17/20 0807 10/15/20 2151 10/23/20 1813 10/24/20 0232 10/25/20 0453  WBC 3.2*  --  11.0*   < > 12.5* 12.1* 10.0  NEUTROABS 1.4*  --  8.5*  --   --  9.3*  --   HGB 11.8*  --  13.2   < > 10.2* 10.7* 10.6*  HCT 34.8*  --  40.8   < > 32.2* 33.4* 32.7*  MCV 85.5  --  87.4   < > 86.3 86.5 84.7  PLT 5*   < > 297   < > 308 319 312   < > = values in this interval not displayed.   Lab Results  Component Value Date   TSH 0.936 10/24/2020   Lab Results  Component Value Date   HGBA1C 6.1 (H) 10/24/2020   No results found for: CHOL, HDL, LDLCALC, LDLDIRECT, TRIG, CHOLHDL  Significant Diagnostic Results in last 30 days:  No results found.  Assessment/Plan 1. Recurrent UTI - EBSL UTI 09/10-09/13 - UA 12/03/2020 +1 occult blood and leukocytes, WBC 10.0 12/06/2020 - urine culture 12/03/2020 negative for bacterial growth - no urinary symptoms at this time - start cranberry supplement 450 mg daily for prevention - encourage fluids  2. Encephalopathy - alert, nonverbal, follows simple commands today - Na+ 136 12/06/2020  3. Frontotemporal dementia with  behavioral disturbance (HCC) - no recent behavioral outbursts - cont skilled nursing care - cont Seroquel and klonopin prn  4. Essential hypertension - controlled - cont lisinopril  5. Hyperlipidemia associated with type 2 diabetes mellitus (HCC) - LDL  50 12/06/2020 - cont simvastatin  6. Acquired hypothyroidism - TSH normal - cont levothyroxine  7. Dysphagia, unspecified type - seen by ST this morning - finger foods recommended - more able to follow commands with direct cues  8. Slow transit constipation - abdomen soft - LBM 10/23 - cont colace daily    Family/ staff Communication: plan discussed with nurse  Labs/tests ordered:  none

## 2020-12-07 DIAGNOSIS — R2681 Unsteadiness on feet: Secondary | ICD-10-CM | POA: Diagnosis not present

## 2020-12-07 DIAGNOSIS — E119 Type 2 diabetes mellitus without complications: Secondary | ICD-10-CM | POA: Diagnosis not present

## 2020-12-07 DIAGNOSIS — M6281 Muscle weakness (generalized): Secondary | ICD-10-CM | POA: Diagnosis not present

## 2020-12-07 DIAGNOSIS — Z89612 Acquired absence of left leg above knee: Secondary | ICD-10-CM | POA: Diagnosis not present

## 2020-12-07 DIAGNOSIS — R4789 Other speech disturbances: Secondary | ICD-10-CM | POA: Diagnosis not present

## 2020-12-07 DIAGNOSIS — Z9181 History of falling: Secondary | ICD-10-CM | POA: Diagnosis not present

## 2020-12-07 LAB — HEMOGLOBIN A1C: Hemoglobin A1C: 6.1

## 2020-12-07 LAB — HEPATIC FUNCTION PANEL
ALT: 8 (ref 7–35)
AST: 12 — AB (ref 13–35)
Alkaline Phosphatase: 51 (ref 25–125)
Bilirubin, Total: 0.3

## 2020-12-07 LAB — COMPREHENSIVE METABOLIC PANEL
Albumin: 3.5 (ref 3.5–5.0)
Calcium: 9.7 (ref 8.7–10.7)
Globulin: 2.5

## 2020-12-07 LAB — BASIC METABOLIC PANEL
BUN: 15 (ref 4–21)
CO2: 24 — AB (ref 13–22)
Chloride: 104 (ref 99–108)
Creatinine: 0.7 (ref 0.5–1.1)
Glucose: 93
Potassium: 4.7 (ref 3.4–5.3)
Sodium: 136 — AB (ref 137–147)

## 2020-12-07 LAB — CBC AND DIFFERENTIAL
HCT: 35 — AB (ref 36–46)
Hemoglobin: 11 — AB (ref 12.0–16.0)
Platelets: 353 (ref 150–399)
WBC: 10

## 2020-12-07 LAB — TSH: TSH: 2.52 (ref 0.41–5.90)

## 2020-12-07 LAB — LIPID PANEL
Cholesterol: 120 (ref 0–200)
HDL: 47 (ref 35–70)
LDL Cholesterol: 50
LDl/HDL Ratio: 2.6
Triglycerides: 148 (ref 40–160)

## 2020-12-07 LAB — CBC: RBC: 4.17 (ref 3.87–5.11)

## 2020-12-08 DIAGNOSIS — Z89612 Acquired absence of left leg above knee: Secondary | ICD-10-CM | POA: Diagnosis not present

## 2020-12-08 DIAGNOSIS — M6281 Muscle weakness (generalized): Secondary | ICD-10-CM | POA: Diagnosis not present

## 2020-12-08 DIAGNOSIS — E119 Type 2 diabetes mellitus without complications: Secondary | ICD-10-CM | POA: Diagnosis not present

## 2020-12-08 DIAGNOSIS — Z9181 History of falling: Secondary | ICD-10-CM | POA: Diagnosis not present

## 2020-12-08 DIAGNOSIS — R2681 Unsteadiness on feet: Secondary | ICD-10-CM | POA: Diagnosis not present

## 2020-12-08 DIAGNOSIS — R4789 Other speech disturbances: Secondary | ICD-10-CM | POA: Diagnosis not present

## 2020-12-09 DIAGNOSIS — M6281 Muscle weakness (generalized): Secondary | ICD-10-CM | POA: Diagnosis not present

## 2020-12-09 DIAGNOSIS — Z89612 Acquired absence of left leg above knee: Secondary | ICD-10-CM | POA: Diagnosis not present

## 2020-12-09 DIAGNOSIS — Z9181 History of falling: Secondary | ICD-10-CM | POA: Diagnosis not present

## 2020-12-09 DIAGNOSIS — R2681 Unsteadiness on feet: Secondary | ICD-10-CM | POA: Diagnosis not present

## 2020-12-09 DIAGNOSIS — R4789 Other speech disturbances: Secondary | ICD-10-CM | POA: Diagnosis not present

## 2020-12-09 DIAGNOSIS — E119 Type 2 diabetes mellitus without complications: Secondary | ICD-10-CM | POA: Diagnosis not present

## 2020-12-10 DIAGNOSIS — R2681 Unsteadiness on feet: Secondary | ICD-10-CM | POA: Diagnosis not present

## 2020-12-10 DIAGNOSIS — M6281 Muscle weakness (generalized): Secondary | ICD-10-CM | POA: Diagnosis not present

## 2020-12-10 DIAGNOSIS — Z89612 Acquired absence of left leg above knee: Secondary | ICD-10-CM | POA: Diagnosis not present

## 2020-12-10 DIAGNOSIS — E119 Type 2 diabetes mellitus without complications: Secondary | ICD-10-CM | POA: Diagnosis not present

## 2020-12-10 DIAGNOSIS — Z9181 History of falling: Secondary | ICD-10-CM | POA: Diagnosis not present

## 2020-12-10 DIAGNOSIS — R4789 Other speech disturbances: Secondary | ICD-10-CM | POA: Diagnosis not present

## 2020-12-13 DIAGNOSIS — Z9181 History of falling: Secondary | ICD-10-CM | POA: Diagnosis not present

## 2020-12-13 DIAGNOSIS — R2681 Unsteadiness on feet: Secondary | ICD-10-CM | POA: Diagnosis not present

## 2020-12-13 DIAGNOSIS — R4789 Other speech disturbances: Secondary | ICD-10-CM | POA: Diagnosis not present

## 2020-12-13 DIAGNOSIS — Z89612 Acquired absence of left leg above knee: Secondary | ICD-10-CM | POA: Diagnosis not present

## 2020-12-13 DIAGNOSIS — M6281 Muscle weakness (generalized): Secondary | ICD-10-CM | POA: Diagnosis not present

## 2020-12-13 DIAGNOSIS — E119 Type 2 diabetes mellitus without complications: Secondary | ICD-10-CM | POA: Diagnosis not present

## 2020-12-14 DIAGNOSIS — R4789 Other speech disturbances: Secondary | ICD-10-CM | POA: Diagnosis not present

## 2020-12-14 DIAGNOSIS — E119 Type 2 diabetes mellitus without complications: Secondary | ICD-10-CM | POA: Diagnosis not present

## 2020-12-14 DIAGNOSIS — Z9181 History of falling: Secondary | ICD-10-CM | POA: Diagnosis not present

## 2020-12-14 DIAGNOSIS — M6281 Muscle weakness (generalized): Secondary | ICD-10-CM | POA: Diagnosis not present

## 2020-12-14 DIAGNOSIS — R2681 Unsteadiness on feet: Secondary | ICD-10-CM | POA: Diagnosis not present

## 2020-12-14 DIAGNOSIS — Z89612 Acquired absence of left leg above knee: Secondary | ICD-10-CM | POA: Diagnosis not present

## 2020-12-15 DIAGNOSIS — R2681 Unsteadiness on feet: Secondary | ICD-10-CM | POA: Diagnosis not present

## 2020-12-15 DIAGNOSIS — Z9181 History of falling: Secondary | ICD-10-CM | POA: Diagnosis not present

## 2020-12-15 DIAGNOSIS — E119 Type 2 diabetes mellitus without complications: Secondary | ICD-10-CM | POA: Diagnosis not present

## 2020-12-15 DIAGNOSIS — R4789 Other speech disturbances: Secondary | ICD-10-CM | POA: Diagnosis not present

## 2020-12-15 DIAGNOSIS — M6281 Muscle weakness (generalized): Secondary | ICD-10-CM | POA: Diagnosis not present

## 2020-12-15 DIAGNOSIS — Z89612 Acquired absence of left leg above knee: Secondary | ICD-10-CM | POA: Diagnosis not present

## 2020-12-16 ENCOUNTER — Non-Acute Institutional Stay (SKILLED_NURSING_FACILITY): Payer: Medicare Other | Admitting: Internal Medicine

## 2020-12-16 ENCOUNTER — Encounter: Payer: Self-pay | Admitting: Internal Medicine

## 2020-12-16 DIAGNOSIS — E871 Hypo-osmolality and hyponatremia: Secondary | ICD-10-CM

## 2020-12-16 DIAGNOSIS — G3109 Other frontotemporal dementia: Secondary | ICD-10-CM | POA: Diagnosis not present

## 2020-12-16 DIAGNOSIS — E039 Hypothyroidism, unspecified: Secondary | ICD-10-CM | POA: Diagnosis not present

## 2020-12-16 DIAGNOSIS — F02818 Dementia in other diseases classified elsewhere, unspecified severity, with other behavioral disturbance: Secondary | ICD-10-CM

## 2020-12-16 DIAGNOSIS — N390 Urinary tract infection, site not specified: Secondary | ICD-10-CM

## 2020-12-16 DIAGNOSIS — E1122 Type 2 diabetes mellitus with diabetic chronic kidney disease: Secondary | ICD-10-CM

## 2020-12-16 DIAGNOSIS — Z9181 History of falling: Secondary | ICD-10-CM | POA: Diagnosis not present

## 2020-12-16 DIAGNOSIS — E785 Hyperlipidemia, unspecified: Secondary | ICD-10-CM | POA: Diagnosis not present

## 2020-12-16 DIAGNOSIS — N182 Chronic kidney disease, stage 2 (mild): Secondary | ICD-10-CM

## 2020-12-16 DIAGNOSIS — Z794 Long term (current) use of insulin: Secondary | ICD-10-CM

## 2020-12-16 DIAGNOSIS — M6281 Muscle weakness (generalized): Secondary | ICD-10-CM | POA: Diagnosis not present

## 2020-12-16 DIAGNOSIS — E1169 Type 2 diabetes mellitus with other specified complication: Secondary | ICD-10-CM

## 2020-12-16 DIAGNOSIS — G9340 Encephalopathy, unspecified: Secondary | ICD-10-CM

## 2020-12-16 DIAGNOSIS — R4789 Other speech disturbances: Secondary | ICD-10-CM | POA: Diagnosis not present

## 2020-12-16 DIAGNOSIS — R2681 Unsteadiness on feet: Secondary | ICD-10-CM | POA: Diagnosis not present

## 2020-12-16 DIAGNOSIS — Z89612 Acquired absence of left leg above knee: Secondary | ICD-10-CM | POA: Diagnosis not present

## 2020-12-16 DIAGNOSIS — E119 Type 2 diabetes mellitus without complications: Secondary | ICD-10-CM | POA: Diagnosis not present

## 2020-12-16 NOTE — Progress Notes (Signed)
Location: [place] Room Number: 29 Place of Service:  SNF ([id number])  Provider:   Code Status: DNR Goals of Care:  Advanced Directives 12/16/2020  Does Patient Have a Medical Advance Directive? Yes  Type of Paramedic of [place];Living will;Out of facility DNR (pink MOST or yellow form)  Does patient want to make changes to medical advance directive? No - Patient declined  Copy of [place] in Chart? Yes - validated most recent copy scanned in chart (See row information)  Pre-existing out of facility DNR order (yellow form or pink MOST form) -     Chief Complaint  Patient presents with   Acute Visit    HPI: Patient is a 70 y.o. female seen today for an acute visit per her sister  Patient is in SNF for Therapy and Long term care  Assisted patient was diagnosed with frontotemporal dementia in 2019. She presented with inability to take care of herself and psychosis. 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  But for past 4 weeks she has been getting weaker unable to take care of herself unable to do her ADLs.  Nonverbal.  Needing [facility] lift for transfers Sister had her transferred to SNF for more care Patient husband killed himself years ago. 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 She is retired Marine scientist Her sister is the POA  We took her off Seroquel and Buspar Now on Klonipin Prn Sister thinks she is still not at her baseline Per speech therapy she is having alternative conscious with sometimes she is very responsive but then sometimes she shuts herself Physical therapy Not making much progress Needs helps with her ADLS. Need help with her Transfers. Using hoyer  Patient seemed more aler  today. She nodded her head when we asked her if she depressed. But stayed mute Was eating.     Past Medical History:  Diagnosis Date   Dementia ([place])    Diabetes mellitus without complication ([place])    Hx of BKA, left ([place]) 10/24/2020   Thyroid disease     History reviewed. No pertinent surgical history.  No Known Allergies  Outpatient Encounter Medications as of 12/16/2020  Medication Sig   acetaminophen (TYLENOL) 500 MG tablet Take 1,000 mg by mouth 2 (two) times daily as needed for fever (pain).   calcium carbonate (OS-CAL) 600 MG TABS tablet Take 600 mg by mouth 2 (two) times daily.   Camphor-Menthol-Methyl Sal (SALONPAS) 3.02-19-08 % PTCH Place 1 patch onto the skin See admin instructions. Apply one patch transdermally to left aka stump daily as needed for pain   citalopram (CELEXA) 10 MG tablet Take 10 mg by mouth every morning.   clonazePAM (KLONOPIN) 0.5 MG tablet Take 0.5 mg by mouth 2 (two) times daily as needed.   Cranberry 450 MG TABS Take by mouth daily.   Docusate Sodium 100 MG capsule Take 100 mg by mouth 2 (two) times daily.   guaiFENesin (ROBITUSSIN) 100 MG/5ML liquid Take 200 mg by mouth every 4 (four) hours as needed for cough or congestion.   insulin glargine (LANTUS SOLOSTAR) 100 UNIT/ML Solostar Pen Inject 14 Units into the skin daily.   levothyroxine (SYNTHROID) 50 MCG tablet Take 50 mcg by mouth daily before breakfast.  lisinopril (ZESTRIL) 2.5 MG tablet Take 2.5 mg by mouth every morning.   loperamide (IMODIUM A-D) 2 MG tablet Take 2 mg by mouth 3 (three) times daily as needed for diarrhea or loose stools.   melatonin 3 MG TABS tablet Take 3 mg by mouth at bedtime.   metFORMIN (GLUCOPHAGE) 1000 MG tablet Take 1,000 mg by mouth 2 (two) times daily.   Multiple Vitamins-Minerals (HEALTHY EYES/LUTEIN-ZEAXANTHIN) CAPS Take 1 capsule by mouth every morning.   NYAMYC powder Apply 1 application topically 2 (two) times daily as needed (groin rash).   pioglitazone  (ACTOS) 15 MG tablet Take 15 mg by mouth daily.   polyethylene glycol (MIRALAX / GLYCOLAX) 17 g packet Take 17 g by mouth daily as needed.   simvastatin (ZOCOR) 20 MG tablet Take 20 mg by mouth at bedtime.   sodium chloride 1 g tablet Take 1 g by mouth 2 (two) times daily.   thiamine 100 MG tablet Take 100 mg by mouth every morning. Vitamin B1   [EXPIRED] tuberculin (TUBERSOL) 5 UNIT/0.1ML injection Inject 5 Units into the skin once.   vitamin B-12 (CYANOCOBALAMIN) 500 MCG tablet Take 500 mcg by mouth daily.   zinc oxide 20 % ointment Apply 1 application topically 3 (three) times daily as needed (apply to buttocks for redness).   [DISCONTINUED] QUEtiapine (SEROQUEL) 50 MG tablet Take 25 mg by mouth 2 (two) times daily as needed.   [DISCONTINUED] traZODone (DESYREL) 50 MG tablet Take 50 mg by mouth every 12 (twelve) hours as needed for sleep.   No facility-administered encounter medications on file as of 12/16/2020.    Review of Systems:  Review of Systems Patient nods her head sometimes but basically mute  Health Maintenance  Topic Date Due   COVID-19 Vaccine (1) Never done   Pneumonia Vaccine 57+ Years old (1 - PCV) Never done   FOOT EXAM  Never done   OPHTHALMOLOGY EXAM  Never done   Hepatitis C Screening  Never done   TETANUS/TDAP  Never done   Zoster Vaccines- Shingrix (1 of 2) Never done   COLONOSCOPY (Pts 45-50yrs Insurance coverage will need to be confirmed)  Never done   MAMMOGRAM  Never done   DEXA SCAN  Never done   INFLUENZA VACCINE  09/13/2020   HEMOGLOBIN A1C  06/07/2021   HPV VACCINES  Aged Out    Physical Exam: Vitals:   12/16/20 1613  BP: 111/63  Pulse: 71  Resp: 20  Temp: 97.6 F (36.4 C)  SpO2: 97%  Weight: 167 lb 12.8 oz (76.1 kg)  Height: 5\' 7"  (1.702 m)   Body mass index is 26.28 kg/m. Physical Exam Vitals reviewed.  Constitutional:      Appearance: Normal appearance.  HENT:     Head: Normocephalic.     Nose: Nose normal.     Mouth/Throat:      Mouth: Mucous membranes are moist.     Pharynx: Oropharynx is clear.  Eyes:     Pupils: Pupils are equal, round, and reactive to light.  Cardiovascular:     Rate and Rhythm: Normal rate and regular rhythm.     Pulses: Normal pulses.     Heart sounds: Normal heart sounds.  Pulmonary:     Effort: Pulmonary effort is normal.     Breath sounds: Normal breath sounds.  Abdominal:     General: Abdomen is flat. Bowel sounds are normal.     Palpations: Abdomen is soft.  Musculoskeletal:  General: No swelling.     Cervical back: Neck supple.     Comments: S/p Left BKA  Skin:    General: Skin is warm.  Neurological:     General: No focal deficit present.     Mental Status: She is alert.     Comments: More alert today  Psychiatric:        Mood and Affect: Mood normal.        Thought Content: Thought content normal.    Labs reviewed: Basic Metabolic Panel: Recent Labs    10/23/20 1813 10/24/20 0040 10/24/20 0232 10/25/20 0453 12/07/20 0000  NA 145  --  140 134* 5*  K 4.3  --  3.9 4.3 4.7  CL 111  --  106 104 104  CO2 24  --  25 21* 24*  GLUCOSE 123*  --  131* 109*  --   BUN 28*  --  26* 18 15  CREATININE 0.62  --  0.70 0.67 0.7  CALCIUM 10.3  --  9.6 8.4* 9.7  MG  --   --  1.6* 1.9  --   TSH  --  0.936  --   --  2.52   Liver Function Tests: Recent Labs    03/17/20 0807 10/23/20 1813 10/24/20 0232 12/07/20 0000  AST 19 17 13* 12*  ALT 15 16 16 8   ALKPHOS 65 55 56 51  BILITOT 0.5 0.7 0.6  --   PROT 7.5 7.0 6.7  --   ALBUMIN 4.0 3.1* 3.0* 3.5   No results for input(s): LIPASE, AMYLASE in the last 8760 hours. Recent Labs    10/24/20 0040  AMMONIA 21   CBC: Recent Labs    01/24/20 1359 01/24/20 1610 03/17/20 0807 10/15/20 2151 10/23/20 1813 10/24/20 0232 10/25/20 0453 12/07/20 0000  WBC 3.2*  --  11.0*   < > 12.5* 12.1* 10.0 10.0  NEUTROABS 1.4*  --  8.5*  --   --  9.3*  --   --   HGB 11.8*  --  13.2   < > 10.2* 10.7* 10.6* 11.0*  HCT 34.8*   --  40.8   < > 32.2* 33.4* 32.7* 35*  MCV 85.5  --  87.4   < > 86.3 86.5 84.7  --   PLT 5*   < > 297   < > 308 319 312 353   < > = values in this interval not displayed.   Lipid Panel: Recent Labs    12/07/20 0000  CHOL 120  HDL 47  LDLCALC 50  TRIG 148   Lab Results  Component Value Date   HGBA1C 6.1 12/07/2020    Procedures since last visit: No results found.  Assessment/Plan Encephalopathy Per sister she is still not at her baseline and no improvement Stays depednt for her ADLS  Multifactorial. 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  Markedly severe cerebral atrophy most significant temporal parietal lobes. Clinically correlate for Alzheimer's   Sister does not want more Work up Cisco aspirin Already on statin  Frontotemporal dementia with behavioral disturbance ([place]) Supportive care Palliative care consult written Off all meds except Klonipin Recurrent UTI Recent UA was negative  Hyperlipidemia associated with type 2 diabetes mellitus ([place]) On statin LDL 50 Acquired hypothyroidism 0.936 in 9/22 Type 2 diabetes mellitus  with stage 2 chronic kidney disease, with long-term current use of insulin (HCC) Discontinue Actos A1C 6.1 Only use Lantus and Metformin to control sugars  Hyponatremia On Sodium Tabs BID H/o Pschogenic drinking  Depression Add Well butrin 75 mg BID Off Seroquel and trazadone Klonpin prn and Cetalopram Pressure wound Use Zinc for now Foam for the open Area  Labs/tests ordered:  * No order type specified * Next appt:  Visit date not found

## 2020-12-17 DIAGNOSIS — E119 Type 2 diabetes mellitus without complications: Secondary | ICD-10-CM | POA: Diagnosis not present

## 2020-12-17 DIAGNOSIS — M6281 Muscle weakness (generalized): Secondary | ICD-10-CM | POA: Diagnosis not present

## 2020-12-17 DIAGNOSIS — Z9181 History of falling: Secondary | ICD-10-CM | POA: Diagnosis not present

## 2020-12-17 DIAGNOSIS — Z89612 Acquired absence of left leg above knee: Secondary | ICD-10-CM | POA: Diagnosis not present

## 2020-12-17 DIAGNOSIS — R4789 Other speech disturbances: Secondary | ICD-10-CM | POA: Diagnosis not present

## 2020-12-17 DIAGNOSIS — R2681 Unsteadiness on feet: Secondary | ICD-10-CM | POA: Diagnosis not present

## 2020-12-20 DIAGNOSIS — R4789 Other speech disturbances: Secondary | ICD-10-CM | POA: Diagnosis not present

## 2020-12-20 DIAGNOSIS — Z9181 History of falling: Secondary | ICD-10-CM | POA: Diagnosis not present

## 2020-12-20 DIAGNOSIS — M6281 Muscle weakness (generalized): Secondary | ICD-10-CM | POA: Diagnosis not present

## 2020-12-20 DIAGNOSIS — R2681 Unsteadiness on feet: Secondary | ICD-10-CM | POA: Diagnosis not present

## 2020-12-20 DIAGNOSIS — E119 Type 2 diabetes mellitus without complications: Secondary | ICD-10-CM | POA: Diagnosis not present

## 2020-12-20 DIAGNOSIS — Z89612 Acquired absence of left leg above knee: Secondary | ICD-10-CM | POA: Diagnosis not present

## 2020-12-21 DIAGNOSIS — Z89612 Acquired absence of left leg above knee: Secondary | ICD-10-CM | POA: Diagnosis not present

## 2020-12-21 DIAGNOSIS — R4789 Other speech disturbances: Secondary | ICD-10-CM | POA: Diagnosis not present

## 2020-12-21 DIAGNOSIS — E119 Type 2 diabetes mellitus without complications: Secondary | ICD-10-CM | POA: Diagnosis not present

## 2020-12-21 DIAGNOSIS — R2681 Unsteadiness on feet: Secondary | ICD-10-CM | POA: Diagnosis not present

## 2020-12-21 DIAGNOSIS — M6281 Muscle weakness (generalized): Secondary | ICD-10-CM | POA: Diagnosis not present

## 2020-12-21 DIAGNOSIS — Z9181 History of falling: Secondary | ICD-10-CM | POA: Diagnosis not present

## 2020-12-22 DIAGNOSIS — E119 Type 2 diabetes mellitus without complications: Secondary | ICD-10-CM | POA: Diagnosis not present

## 2020-12-22 DIAGNOSIS — R4789 Other speech disturbances: Secondary | ICD-10-CM | POA: Diagnosis not present

## 2020-12-22 DIAGNOSIS — Z89612 Acquired absence of left leg above knee: Secondary | ICD-10-CM | POA: Diagnosis not present

## 2020-12-22 DIAGNOSIS — Z9181 History of falling: Secondary | ICD-10-CM | POA: Diagnosis not present

## 2020-12-22 DIAGNOSIS — R2681 Unsteadiness on feet: Secondary | ICD-10-CM | POA: Diagnosis not present

## 2020-12-22 DIAGNOSIS — M6281 Muscle weakness (generalized): Secondary | ICD-10-CM | POA: Diagnosis not present

## 2020-12-23 DIAGNOSIS — M6281 Muscle weakness (generalized): Secondary | ICD-10-CM | POA: Diagnosis not present

## 2020-12-23 DIAGNOSIS — R2681 Unsteadiness on feet: Secondary | ICD-10-CM | POA: Diagnosis not present

## 2020-12-23 DIAGNOSIS — E119 Type 2 diabetes mellitus without complications: Secondary | ICD-10-CM | POA: Diagnosis not present

## 2020-12-23 DIAGNOSIS — R4789 Other speech disturbances: Secondary | ICD-10-CM | POA: Diagnosis not present

## 2020-12-23 DIAGNOSIS — Z9181 History of falling: Secondary | ICD-10-CM | POA: Diagnosis not present

## 2020-12-23 DIAGNOSIS — Z89612 Acquired absence of left leg above knee: Secondary | ICD-10-CM | POA: Diagnosis not present

## 2020-12-24 DIAGNOSIS — R2681 Unsteadiness on feet: Secondary | ICD-10-CM | POA: Diagnosis not present

## 2020-12-24 DIAGNOSIS — Z9181 History of falling: Secondary | ICD-10-CM | POA: Diagnosis not present

## 2020-12-24 DIAGNOSIS — Z89612 Acquired absence of left leg above knee: Secondary | ICD-10-CM | POA: Diagnosis not present

## 2020-12-24 DIAGNOSIS — R4789 Other speech disturbances: Secondary | ICD-10-CM | POA: Diagnosis not present

## 2020-12-24 DIAGNOSIS — E119 Type 2 diabetes mellitus without complications: Secondary | ICD-10-CM | POA: Diagnosis not present

## 2020-12-24 DIAGNOSIS — M6281 Muscle weakness (generalized): Secondary | ICD-10-CM | POA: Diagnosis not present

## 2020-12-27 DIAGNOSIS — M6281 Muscle weakness (generalized): Secondary | ICD-10-CM | POA: Diagnosis not present

## 2020-12-27 DIAGNOSIS — R2681 Unsteadiness on feet: Secondary | ICD-10-CM | POA: Diagnosis not present

## 2020-12-27 DIAGNOSIS — R4789 Other speech disturbances: Secondary | ICD-10-CM | POA: Diagnosis not present

## 2020-12-27 DIAGNOSIS — E119 Type 2 diabetes mellitus without complications: Secondary | ICD-10-CM | POA: Diagnosis not present

## 2020-12-27 DIAGNOSIS — Z9181 History of falling: Secondary | ICD-10-CM | POA: Diagnosis not present

## 2020-12-27 DIAGNOSIS — Z89612 Acquired absence of left leg above knee: Secondary | ICD-10-CM | POA: Diagnosis not present

## 2020-12-29 DIAGNOSIS — R2681 Unsteadiness on feet: Secondary | ICD-10-CM | POA: Diagnosis not present

## 2020-12-29 DIAGNOSIS — M6281 Muscle weakness (generalized): Secondary | ICD-10-CM | POA: Diagnosis not present

## 2020-12-29 DIAGNOSIS — Z9181 History of falling: Secondary | ICD-10-CM | POA: Diagnosis not present

## 2020-12-29 DIAGNOSIS — R4789 Other speech disturbances: Secondary | ICD-10-CM | POA: Diagnosis not present

## 2020-12-29 DIAGNOSIS — Z89612 Acquired absence of left leg above knee: Secondary | ICD-10-CM | POA: Diagnosis not present

## 2020-12-29 DIAGNOSIS — E119 Type 2 diabetes mellitus without complications: Secondary | ICD-10-CM | POA: Diagnosis not present

## 2020-12-30 DIAGNOSIS — Z89612 Acquired absence of left leg above knee: Secondary | ICD-10-CM | POA: Diagnosis not present

## 2020-12-30 DIAGNOSIS — Z9181 History of falling: Secondary | ICD-10-CM | POA: Diagnosis not present

## 2020-12-30 DIAGNOSIS — E119 Type 2 diabetes mellitus without complications: Secondary | ICD-10-CM | POA: Diagnosis not present

## 2020-12-30 DIAGNOSIS — R4789 Other speech disturbances: Secondary | ICD-10-CM | POA: Diagnosis not present

## 2020-12-30 DIAGNOSIS — R2681 Unsteadiness on feet: Secondary | ICD-10-CM | POA: Diagnosis not present

## 2020-12-30 DIAGNOSIS — M6281 Muscle weakness (generalized): Secondary | ICD-10-CM | POA: Diagnosis not present

## 2020-12-31 DIAGNOSIS — R2681 Unsteadiness on feet: Secondary | ICD-10-CM | POA: Diagnosis not present

## 2020-12-31 DIAGNOSIS — E119 Type 2 diabetes mellitus without complications: Secondary | ICD-10-CM | POA: Diagnosis not present

## 2020-12-31 DIAGNOSIS — M6281 Muscle weakness (generalized): Secondary | ICD-10-CM | POA: Diagnosis not present

## 2020-12-31 DIAGNOSIS — Z89612 Acquired absence of left leg above knee: Secondary | ICD-10-CM | POA: Diagnosis not present

## 2020-12-31 DIAGNOSIS — R4789 Other speech disturbances: Secondary | ICD-10-CM | POA: Diagnosis not present

## 2020-12-31 DIAGNOSIS — Z9181 History of falling: Secondary | ICD-10-CM | POA: Diagnosis not present

## 2021-01-02 DIAGNOSIS — Z89612 Acquired absence of left leg above knee: Secondary | ICD-10-CM | POA: Diagnosis not present

## 2021-01-02 DIAGNOSIS — M6281 Muscle weakness (generalized): Secondary | ICD-10-CM | POA: Diagnosis not present

## 2021-01-02 DIAGNOSIS — R2681 Unsteadiness on feet: Secondary | ICD-10-CM | POA: Diagnosis not present

## 2021-01-02 DIAGNOSIS — E119 Type 2 diabetes mellitus without complications: Secondary | ICD-10-CM | POA: Diagnosis not present

## 2021-01-02 DIAGNOSIS — Z9181 History of falling: Secondary | ICD-10-CM | POA: Diagnosis not present

## 2021-01-02 DIAGNOSIS — R4789 Other speech disturbances: Secondary | ICD-10-CM | POA: Diagnosis not present

## 2021-01-03 ENCOUNTER — Non-Acute Institutional Stay (SKILLED_NURSING_FACILITY): Payer: Medicare Other | Admitting: Orthopedic Surgery

## 2021-01-03 ENCOUNTER — Encounter: Payer: Self-pay | Admitting: Orthopedic Surgery

## 2021-01-03 ENCOUNTER — Other Ambulatory Visit: Payer: Self-pay

## 2021-01-03 ENCOUNTER — Non-Acute Institutional Stay: Payer: Medicare Other | Admitting: *Deleted

## 2021-01-03 VITALS — BP 116/60 | HR 65 | Temp 97.7°F | Resp 16

## 2021-01-03 DIAGNOSIS — E039 Hypothyroidism, unspecified: Secondary | ICD-10-CM | POA: Diagnosis not present

## 2021-01-03 DIAGNOSIS — N182 Chronic kidney disease, stage 2 (mild): Secondary | ICD-10-CM | POA: Diagnosis not present

## 2021-01-03 DIAGNOSIS — F02818 Dementia in other diseases classified elsewhere, unspecified severity, with other behavioral disturbance: Secondary | ICD-10-CM | POA: Diagnosis not present

## 2021-01-03 DIAGNOSIS — I10 Essential (primary) hypertension: Secondary | ICD-10-CM | POA: Diagnosis not present

## 2021-01-03 DIAGNOSIS — R1310 Dysphagia, unspecified: Secondary | ICD-10-CM | POA: Diagnosis not present

## 2021-01-03 DIAGNOSIS — K5901 Slow transit constipation: Secondary | ICD-10-CM

## 2021-01-03 DIAGNOSIS — Z794 Long term (current) use of insulin: Secondary | ICD-10-CM

## 2021-01-03 DIAGNOSIS — Z515 Encounter for palliative care: Secondary | ICD-10-CM

## 2021-01-03 DIAGNOSIS — E785 Hyperlipidemia, unspecified: Secondary | ICD-10-CM | POA: Diagnosis not present

## 2021-01-03 DIAGNOSIS — G9340 Encephalopathy, unspecified: Secondary | ICD-10-CM | POA: Diagnosis not present

## 2021-01-03 DIAGNOSIS — E1169 Type 2 diabetes mellitus with other specified complication: Secondary | ICD-10-CM | POA: Diagnosis not present

## 2021-01-03 DIAGNOSIS — G3109 Other frontotemporal dementia: Secondary | ICD-10-CM | POA: Diagnosis not present

## 2021-01-03 DIAGNOSIS — E1122 Type 2 diabetes mellitus with diabetic chronic kidney disease: Secondary | ICD-10-CM | POA: Diagnosis not present

## 2021-01-03 DIAGNOSIS — E871 Hypo-osmolality and hyponatremia: Secondary | ICD-10-CM | POA: Diagnosis not present

## 2021-01-03 NOTE — Progress Notes (Signed)
[facility] COMMUNITY PALLIATIVE CARE RN NOTE  PATIENT NAME: Alexis Becker DOB: 26-Apr-1950 MRN: [id number]  PRIMARY CARE PROVIDER: Mahlon Gammon, MD  RESPONSIBLE PARTY: Alexis Becker (sister) Acct ID - Guarantor Home Phone Work Phone Relationship Acct Type  [id number] Alexis Becker, Alexis Becker [phone]  Self P/F     [address], [place], [place] [address]   Covid-19 Pre-screening Negative  PLAN OF CARE and INTERVENTION:  ADVANCE CARE PLANNING/GOALS OF CARE: PATIENT/CAREGIVER EDUCATION: Explained palliative care services, symptom management, safe mobility/transfers DISEASE STATUS: Initial palliative care visit completed today in patient's room. She currently resides at [facility] long term. She was transferred from [place] due to requiring a higher level of care. Her sister Alexis Becker states in September she was diagnosed with a UTI (ESBL) and had a change in her cognitive status, was very lethargic and stopped talking. She also became progressively weaker. She was hospitalized from 10/23/20 to 10/26/20 and was treated with Fosfomycin. Prior to this, she required assistance with bathing and transfers, but was able to feed herself. She now requires feeding. Since being at [facility], she has made some subtle improvements but is still not back to her baseline. She is currently working with PT/OT/ST. She is currently transferred via [facility] lift. She has a left AKA. PT is working on transferring patient with 2 person assist using a stair step. They are also trying to get her to propel herself in her wheelchair again, like she was able to do previously. She is total care with all ADLs. During visit today, she is sitting up in her wheelchair awake and alert. She is more verbal and responsive today. She answered some questions with short replies or would nod yes or no. Staff reports that this varies from day to day. She is actually feeding herself a bag of chips upon my arrival, but still will not use  utensils and feed herself during meal time. Her intake is good. She is a diabetic and currently on Metformin and Lantus. CBG today is 137. Some days she is able to swallow her medications whole with water, and at other times she will hold them in her mouth. Some days staff crushes them and administers in applesauce. She is incontinent of both bowel and bladder. No skin issues. No physical indicators of pain or discomfort. Sister is agreeable to future visits by palliative care.   HISTORY OF PRESENT ILLNESS: This is a 70 yo female with a diagnosis of frontotemporal dementia with behavioral disturbances. She has a history of Wernicke-Korsakoff syndrome, HTN, hyperlipidemia, DM II, hypothyroidism, alcohol abuse and left BKA. Palliative care team has been asked to follow patient for additional support, goals of care and complex decision making.  CODE STATUS: DNR ADVANCED DIRECTIVES: Y MOST FORM: no PPS: 30%   PHYSICAL EXAM:   VITALS: Today's Vitals   01/03/21 1446  BP: 116/60  Pulse: 65  Resp: 16  Temp: 97.7 F (36.5 C)  TempSrc: Temporal  SpO2: 96%  PainSc: 0-No pain    LUNGS: clear to auscultation  CARDIAC: Cor RRR EXTREMITIES: L BKA, no edema SKIN:  Exposed skin is dry and intact   NEURO:  Alert and oriented to person, intermittent confusion, generalized weakness, total care   (Duration of visit and documentation 60 minutes)   Candiss Norse, RN BSN

## 2021-01-03 NOTE — Progress Notes (Signed)
Location:   [facility] Room Number: 29-A Place of Service:  SNF [phone]) Provider:  Hazle Nordmann, NP    Patient Care Team: Mahlon Gammon, MD as PCP - General (Internal Medicine)  Extended Emergency Contact Information Primary Emergency Contact: [facility] Phone: [phone] Relation: Sister Secondary Emergency Contact: [facility] Phone: [phone] Mobile Phone: [phone] Relation: Brother  Code Status:  DNR Goals of care: Advanced Directive information Advanced Directives 01/03/2021  Does Patient Have a Medical Advance Directive? Yes  Type of Estate agent of [place];Living will;Out of facility DNR (pink MOST or yellow form)  Does patient want to make changes to medical advance directive? No - Patient declined  Copy of Healthcare Power of Attorney in Chart? Yes - validated most recent copy scanned in chart (See row information)  Pre-existing out of facility DNR order (yellow form or pink MOST form) -     Chief Complaint  Patient presents with   Medical Management of Chronic Issues    Routine Visit.   Health Maintenance    Discuss the need for Mammogram, Dexa Scan, Colonoscopy, Hepatitis C Screening, Eye exam, and Foot exam.   Immunizations    Discuss the need for Tetanus vaccine, Shingrix vaccine, and 5th Covid Vaccine.     HPI:  Pt is a 70 y.o. female seen today for medical management of chronic diseases.    She currently resides on the skilled nursing unit at [facility]. PMH includes: HTN, hypothyroidism, T2DM, frontotemporal dementia, wernicke-korsakoff syndrome, thrombocytopenia, and left BKA.   Encephalopathy- considered multifactorial, no further workup per goals of care, remains on baby asa and statin, Seroquel and trazodone recently discontinued Frontotemporal dementia- continues to be dependent with ADLs, needs direction to complete tasks, she has started eating in dining room more, talking  varies- some days more than other, no recent behavioral outbursts, klonopin prn, palliative consult made T2DM- hgb A1c 6.1 12/07/2020, blood sugars averaging 100-140's, no recent hypoglycemia, remains on Lantus 14 units daily, metformin, lisinopril, statin , asa HLD- LDL 50 12/07/2020, remains on simvastatin Hypothyroidism- TSH 2.52 12/07/2020, remains on levothyroxine  Hyponatremia- Na 136 12/07/2020 Depression- [facility] consult made per sister request, remains on Wellbutrin and Celexa Pressure wound- changes positions frequently, barrier cream to buttocks, no skin breakdown at this time.  HTN- BUN/creat 15/0.7 12/07/2020, remains on lisinopril Dysphagia- no recent aspirations, remains on regular diet and thin liquids Constipation- LBM 11/19, remains on colace daily and miralax prn   Recent blood pressures:  11/15- 122/68  11/08- 106/64  11/01-111/63  Recent weights:  11/09- 168.6 lbs  10/20- 173.5 lbs     Past Medical History:  Diagnosis Date   Dementia (HCC)    Diabetes mellitus without complication (HCC)    Hx of BKA, left (HCC) 10/24/2020   Thyroid disease    History reviewed. No pertinent surgical history.  No Known Allergies  Allergies as of 01/03/2021   No Known Allergies      Medication List        Accurate as of January 03, 2021  9:56 AM. If you have any questions, ask your nurse or doctor.          acetaminophen 500 MG tablet Commonly known as: TYLENOL Take 1,000 mg by mouth 2 (two) times daily as needed for fever (pain).   aspirin 81 MG chewable tablet Chew by mouth daily.   buPROPion 150 MG 12 hr tablet Commonly known as: WELLBUTRIN SR Take 75 mg by mouth  2 (two) times daily. What changed: Another medication with the same name was removed. Continue taking this medication, and follow the directions you see here. Changed by: Octavia Heir, NP   calcium carbonate 600 MG Tabs tablet Commonly known as: OS-CAL Take 600 mg by mouth 2 (two) times  daily.   citalopram 10 MG tablet Commonly known as: CELEXA Take 10 mg by mouth every morning.   clonazePAM 0.5 MG tablet Commonly known as: KLONOPIN Take 0.5 mg by mouth 2 (two) times daily as needed.   Cranberry 450 MG Tabs Take by mouth daily.   Docusate Sodium 100 MG capsule Take 100 mg by mouth 2 (two) times daily.   guaiFENesin 100 MG/5ML liquid Commonly known as: ROBITUSSIN Take 200 mg by mouth every 4 (four) hours as needed for cough or congestion.   Healthy Eyes/Lutein-Zeaxanthin Caps Take 1 capsule by mouth every morning.   Lantus SoloStar 100 UNIT/ML Solostar Pen Generic drug: insulin glargine Inject 14 Units into the skin daily.   levothyroxine 50 MCG tablet Commonly known as: SYNTHROID Take 50 mcg by mouth daily before breakfast.   lisinopril 2.5 MG tablet Commonly known as: ZESTRIL Take 2.5 mg by mouth every morning.   loperamide 2 MG tablet Commonly known as: IMODIUM A-D Take 2 mg by mouth 3 (three) times daily as needed for diarrhea or loose stools.   melatonin 3 MG Tabs tablet Take 3 mg by mouth at bedtime.   metFORMIN 1000 MG tablet Commonly known as: GLUCOPHAGE Take 1,000 mg by mouth 2 (two) times daily.   Nyamyc powder Generic drug: nystatin Apply 1 application topically 2 (two) times daily as needed (groin rash).   polyethylene glycol 17 g packet Commonly known as: MIRALAX / GLYCOLAX Take 17 g by mouth daily as needed.   Salonpas 3.02-19-08 % Ptch Generic drug: Camphor-Menthol-Methyl Sal Place 1 patch onto the skin See admin instructions. Apply one patch transdermally to left aka stump daily as needed for pain   simvastatin 20 MG tablet Commonly known as: ZOCOR Take 20 mg by mouth at bedtime.   sodium chloride 1 g tablet Take 1 g by mouth 2 (two) times daily.   thiamine 100 MG tablet Take 100 mg by mouth every morning. Vitamin B1   vitamin B-12 500 MCG tablet Commonly known as: CYANOCOBALAMIN Take 500 mcg by mouth daily.   zinc  oxide 20 % ointment Apply 1 application topically 3 (three) times daily as needed (apply to buttocks for redness).        Review of Systems  Unable to perform ROS: Dementia   Immunization History  Administered Date(s) Administered   Influenza-Unspecified 11/11/2019, 11/11/2020   Moderna Sars-Covid-2 Vaccination 03/06/2019, 04/03/2019, 12/18/2019, 11/11/2020   Pertinent  Health Maintenance Due  Topic Date Due   FOOT EXAM  Never done   OPHTHALMOLOGY EXAM  Never done   COLONOSCOPY (Pts 45-66yrs Insurance coverage will need to be confirmed)  Never done   MAMMOGRAM  Never done   DEXA SCAN  Never done   HEMOGLOBIN A1C  06/07/2021   INFLUENZA VACCINE  Completed   Fall Risk 10/24/2020 10/24/2020 10/25/2020 10/25/2020 10/26/2020  Patient Fall Risk Level High fall risk High fall risk High fall risk High fall risk High fall risk   Functional Status Survey:    Vitals:   01/03/21 0945  BP: 122/68  Pulse: 83  Resp: 18  Temp: 97.6 F (36.4 C)  SpO2: 95%  Weight: 168 lb 9.6 oz (76.5 kg)  Height: 5\' 7"  (1.702 m)   Body mass index is 26.41 kg/m. Physical Exam Vitals reviewed.  Constitutional:      General: She is not in acute distress. HENT:     Head: Normocephalic.     Right Ear: There is no impacted cerumen.     Left Ear: There is no impacted cerumen.     Nose: Nose normal.     Mouth/Throat:     Mouth: Mucous membranes are moist.  Eyes:     General:        Right eye: Discharge present.        Left eye: No discharge.  Neck:     Vascular: No carotid bruit.  Cardiovascular:     Rate and Rhythm: Normal rate and regular rhythm.     Pulses: Normal pulses.     Heart sounds: Normal heart sounds. No murmur heard. Pulmonary:     Effort: Pulmonary effort is normal. No respiratory distress.     Breath sounds: Normal breath sounds. No wheezing.  Abdominal:     General: Bowel sounds are normal. There is no distension.     Palpations: Abdomen is soft.     Tenderness: There is no  abdominal tenderness.  Musculoskeletal:     Cervical back: Normal range of motion.     Right lower leg: No edema.     Comments: Left BKA  Lymphadenopathy:     Cervical: No cervical adenopathy.  Skin:    General: Skin is warm and dry.     Capillary Refill: Capillary refill takes less than 2 seconds.  Neurological:     General: No focal deficit present.     Mental Status: She is alert. Mental status is at baseline.     Motor: Weakness present.     Gait: Gait abnormal.     Comments: Wheelchair/walker  Psychiatric:        Mood and Affect: Mood normal.        Behavior: Behavior normal.        Cognition and Memory: Memory is impaired.     Comments: Alert, spoke a few words, followed commands    Labs reviewed: Recent Labs    10/23/20 1813 10/24/20 0232 10/25/20 0453 12/07/20 0000  NA 145 140 134* 5*  K 4.3 3.9 4.3 4.7  CL 111 106 104 104  CO2 24 25 21* 24*  GLUCOSE 123* 131* 109*  --   BUN 28* 26* 18 15  CREATININE 0.62 0.70 0.67 0.7  CALCIUM 10.3 9.6 8.4* 9.7  MG  --  1.6* 1.9  --    Recent Labs    03/17/20 0807 10/23/20 1813 10/24/20 0232 12/07/20 0000  AST 19 17 13* 12*  ALT 15 16 16 8   ALKPHOS 65 55 56 51  BILITOT 0.5 0.7 0.6  --   PROT 7.5 7.0 6.7  --   ALBUMIN 4.0 3.1* 3.0* 3.5   Recent Labs    01/24/20 1359 01/24/20 1610 03/17/20 0807 10/15/20 2151 10/23/20 1813 10/24/20 0232 10/25/20 0453 12/07/20 0000  WBC 3.2*  --  11.0*   < > 12.5* 12.1* 10.0 10.0  NEUTROABS 1.4*  --  8.5*  --   --  9.3*  --   --   HGB 11.8*  --  13.2   < > 10.2* 10.7* 10.6* 11.0*  HCT 34.8*  --  40.8   < > 32.2* 33.4* 32.7* 35*  MCV 85.5  --  87.4   < > 86.3  86.5 84.7  --   PLT 5*   < > 297   < > 308 319 312 353   < > = values in this interval not displayed.   Lab Results  Component Value Date   TSH 2.52 12/07/2020   Lab Results  Component Value Date   HGBA1C 6.1 12/07/2020   Lab Results  Component Value Date   CHOL 120 12/07/2020   HDL 47 12/07/2020   LDLCALC 50  12/07/2020   TRIG 148 12/07/2020    Significant Diagnostic Results in last 30 days:  No results found.  Assessment/Plan 1. Encephalopathy - multifactorial - no aggressive workup per goals of care  2. Frontotemporal dementia with behavioral disturbance (HCC) - no recent behavioral outbursts - starting to eat in dining room - spoke a few words today, very pleasant - cont skilled nursing care - palliative consulted  3. Hyperlipidemia associated with type 2 diabetes mellitus (HCC) - cont statin  4. Acquired hypothyroidism - TSh normal - cont levothyroxine  5. Type 2 diabetes mellitus with stage 2 chronic kidney disease, with long-term current use of insulin (HCC) - blood sugars 100-140's - cont Lantus and metformin, lisinopril, statin and asa  6. Hyponatremia - Na 136 12/07/2020  7. Essential hypertension - controlled - cont lisinopril  8. Dysphagia, unspecified type - no recent aspirations - cont regular diet  9. Slow transit constipation - abdomen soft - LBM 01/02/2019 - cont colace and miralax    Family/ staff Communication: plan discussed with patient and nurse  Labs/tests ordered:  none

## 2021-01-04 DIAGNOSIS — M6281 Muscle weakness (generalized): Secondary | ICD-10-CM | POA: Diagnosis not present

## 2021-01-04 DIAGNOSIS — Z89612 Acquired absence of left leg above knee: Secondary | ICD-10-CM | POA: Diagnosis not present

## 2021-01-04 DIAGNOSIS — Z9181 History of falling: Secondary | ICD-10-CM | POA: Diagnosis not present

## 2021-01-04 DIAGNOSIS — R2681 Unsteadiness on feet: Secondary | ICD-10-CM | POA: Diagnosis not present

## 2021-01-04 DIAGNOSIS — E119 Type 2 diabetes mellitus without complications: Secondary | ICD-10-CM | POA: Diagnosis not present

## 2021-01-04 DIAGNOSIS — R4789 Other speech disturbances: Secondary | ICD-10-CM | POA: Diagnosis not present

## 2021-01-05 DIAGNOSIS — R4789 Other speech disturbances: Secondary | ICD-10-CM | POA: Diagnosis not present

## 2021-01-05 DIAGNOSIS — M6281 Muscle weakness (generalized): Secondary | ICD-10-CM | POA: Diagnosis not present

## 2021-01-05 DIAGNOSIS — E119 Type 2 diabetes mellitus without complications: Secondary | ICD-10-CM | POA: Diagnosis not present

## 2021-01-05 DIAGNOSIS — Z9181 History of falling: Secondary | ICD-10-CM | POA: Diagnosis not present

## 2021-01-05 DIAGNOSIS — Z89612 Acquired absence of left leg above knee: Secondary | ICD-10-CM | POA: Diagnosis not present

## 2021-01-05 DIAGNOSIS — R2681 Unsteadiness on feet: Secondary | ICD-10-CM | POA: Diagnosis not present

## 2021-01-07 DIAGNOSIS — M6281 Muscle weakness (generalized): Secondary | ICD-10-CM | POA: Diagnosis not present

## 2021-01-07 DIAGNOSIS — Z89612 Acquired absence of left leg above knee: Secondary | ICD-10-CM | POA: Diagnosis not present

## 2021-01-07 DIAGNOSIS — E119 Type 2 diabetes mellitus without complications: Secondary | ICD-10-CM | POA: Diagnosis not present

## 2021-01-07 DIAGNOSIS — R2681 Unsteadiness on feet: Secondary | ICD-10-CM | POA: Diagnosis not present

## 2021-01-07 DIAGNOSIS — Z9181 History of falling: Secondary | ICD-10-CM | POA: Diagnosis not present

## 2021-01-07 DIAGNOSIS — R4789 Other speech disturbances: Secondary | ICD-10-CM | POA: Diagnosis not present

## 2021-01-10 DIAGNOSIS — Z9181 History of falling: Secondary | ICD-10-CM | POA: Diagnosis not present

## 2021-01-10 DIAGNOSIS — E119 Type 2 diabetes mellitus without complications: Secondary | ICD-10-CM | POA: Diagnosis not present

## 2021-01-10 DIAGNOSIS — M6281 Muscle weakness (generalized): Secondary | ICD-10-CM | POA: Diagnosis not present

## 2021-01-10 DIAGNOSIS — Z89612 Acquired absence of left leg above knee: Secondary | ICD-10-CM | POA: Diagnosis not present

## 2021-01-10 DIAGNOSIS — R2681 Unsteadiness on feet: Secondary | ICD-10-CM | POA: Diagnosis not present

## 2021-01-10 DIAGNOSIS — R4789 Other speech disturbances: Secondary | ICD-10-CM | POA: Diagnosis not present

## 2021-01-11 DIAGNOSIS — R2681 Unsteadiness on feet: Secondary | ICD-10-CM | POA: Diagnosis not present

## 2021-01-11 DIAGNOSIS — Z89612 Acquired absence of left leg above knee: Secondary | ICD-10-CM | POA: Diagnosis not present

## 2021-01-11 DIAGNOSIS — Z9181 History of falling: Secondary | ICD-10-CM | POA: Diagnosis not present

## 2021-01-11 DIAGNOSIS — R4789 Other speech disturbances: Secondary | ICD-10-CM | POA: Diagnosis not present

## 2021-01-11 DIAGNOSIS — M6281 Muscle weakness (generalized): Secondary | ICD-10-CM | POA: Diagnosis not present

## 2021-01-11 DIAGNOSIS — E119 Type 2 diabetes mellitus without complications: Secondary | ICD-10-CM | POA: Diagnosis not present

## 2021-01-26 ENCOUNTER — Non-Acute Institutional Stay (SKILLED_NURSING_FACILITY): Payer: Medicare Other | Admitting: Orthopedic Surgery

## 2021-01-26 ENCOUNTER — Encounter: Payer: Self-pay | Admitting: Orthopedic Surgery

## 2021-01-26 DIAGNOSIS — R067 Sneezing: Secondary | ICD-10-CM

## 2021-01-26 DIAGNOSIS — I10 Essential (primary) hypertension: Secondary | ICD-10-CM | POA: Diagnosis not present

## 2021-01-26 DIAGNOSIS — Z794 Long term (current) use of insulin: Secondary | ICD-10-CM

## 2021-01-26 DIAGNOSIS — R1310 Dysphagia, unspecified: Secondary | ICD-10-CM | POA: Diagnosis not present

## 2021-01-26 DIAGNOSIS — E039 Hypothyroidism, unspecified: Secondary | ICD-10-CM

## 2021-01-26 DIAGNOSIS — G3109 Other frontotemporal dementia: Secondary | ICD-10-CM | POA: Diagnosis not present

## 2021-01-26 DIAGNOSIS — F02818 Dementia in other diseases classified elsewhere, unspecified severity, with other behavioral disturbance: Secondary | ICD-10-CM | POA: Diagnosis not present

## 2021-01-26 DIAGNOSIS — R451 Restlessness and agitation: Secondary | ICD-10-CM

## 2021-01-26 DIAGNOSIS — E1122 Type 2 diabetes mellitus with diabetic chronic kidney disease: Secondary | ICD-10-CM

## 2021-01-26 DIAGNOSIS — N182 Chronic kidney disease, stage 2 (mild): Secondary | ICD-10-CM

## 2021-01-26 NOTE — Progress Notes (Signed)
Location:   [facility] Room Number: 29 Place of Service:  SNF [phone]) Provider:  Hazle Nordmann, NP  Mahlon Gammon, MD  Patient Care Team: Mahlon Gammon, MD as PCP - General (Internal Medicine)  Extended Emergency Contact Information Primary Emergency Contact: [facility] Phone: [phone] Relation: Sister Secondary Emergency Contact: [facility] Phone: [phone] Mobile Phone: [phone] Relation: Brother  Code Status:  DNR Goals of care: Advanced Directive information Advanced Directives 01/26/2021  Does Patient Have a Medical Advance Directive? Yes  Type of Estate agent of [place];Living will;Out of facility DNR (pink MOST or yellow form)  Does patient want to make changes to medical advance directive? No - Patient declined  Copy of Healthcare Power of Attorney in Chart? Yes - validated most recent copy scanned in chart (See row information)  Pre-existing out of facility DNR order (yellow form or pink MOST form) -     Chief Complaint  Patient presents with   Acute Visit    Agitation    HPI:  Pt is a 70 y.o. female seen today for an acute visit for excessive sneezing and crying.   She currently resides on the skilled nursing unit at [facility] due to frontotemporal dementia. Past medical history includes: HTN, hypothyroidism, T2DM, wernicke-korsakoff syndrome, thrombocytopenia.   Nursing reports she has moments where she is constantly sneezing,crying and yelling out loud. On call NP ordered ativan prn once and symptoms resolved. Today, she has had 2 more episodes of constant sneezing and crying. Past history klonopin and seroquel use for behaviors. Medications recently discontinued due to increased drowsiness.She was also hospitalized for encephalopathy 09/10-09/13, suspected cause multidrug resistant UTI.  Seen by [facility] psych last week, no new medication recommendations. Nonverbal during our  encounter today.      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  No Known Allergies  Allergies as of 01/26/2021   No Known Allergies      Medication List        Accurate as of January 26, 2021 11:23 AM. If you have any questions, ask your nurse or doctor.          STOP taking these medications    clonazePAM 0.5 MG tablet Commonly known as: KLONOPIN Stopped by: Octavia Heir, NP       TAKE these medications    acetaminophen 500 MG tablet Commonly known as: TYLENOL Take 1,000 mg by mouth 2 (two) times daily as needed for fever (pain).   aspirin 81 MG chewable tablet Chew by mouth daily.   buPROPion 150 MG 12 hr tablet Commonly known as: WELLBUTRIN SR Take 75 mg by mouth 2 (two) times daily.   calcium carbonate 600 MG Tabs tablet Commonly known as: OS-CAL Take 600 mg by mouth 2 (two) times daily.   citalopram 10 MG tablet Commonly known as: CELEXA Take 10 mg by mouth every morning.   Cranberry 450 MG Tabs Take by mouth daily.   Docusate Sodium 100 MG capsule Take 100 mg by mouth 2 (two) times daily.   guaiFENesin 100 MG/5ML liquid Commonly known as: ROBITUSSIN Take 200 mg by mouth every 4 (four) hours as needed for cough or congestion.   Healthy Eyes/Lutein-Zeaxanthin Caps Take 1 capsule by mouth every morning.   Lantus SoloStar  100 UNIT/ML Solostar Pen Generic drug: insulin glargine Inject 6 Units into the skin daily. Hold for Blood Sugar less than 110- PRIME PEN WITH 2 UNITS PRIOR TO EACH USE-USE PEN WITHIN 28 DAYS   levothyroxine 50 MCG tablet Commonly known as: SYNTHROID Take 50 mcg by mouth daily before breakfast.   lisinopril 2.5 MG tablet Commonly known as: ZESTRIL Take 2.5 mg by mouth every morning.   loperamide 2 MG tablet Commonly known as: IMODIUM A-D Take 2 mg by mouth 3  (three) times daily as needed for diarrhea or loose stools.   LORazepam 0.5 MG tablet Commonly known as: ATIVAN Take 0.5 mg by mouth 2 (two) times daily.   melatonin 3 MG Tabs tablet Take 3 mg by mouth at bedtime.   metFORMIN 1000 MG tablet Commonly known as: GLUCOPHAGE Take 1,000 mg by mouth 2 (two) times daily.   Nyamyc powder Generic drug: nystatin Apply 1 application topically 2 (two) times daily as needed (groin rash).   polyethylene glycol 17 g packet Commonly known as: MIRALAX / GLYCOLAX Take 17 g by mouth daily as needed.   Salonpas 3.02-19-08 % Ptch Generic drug: Camphor-Menthol-Methyl Sal Place 1 patch onto the skin See admin instructions. Apply one patch transdermally to left aka stump daily as needed for pain   simvastatin 20 MG tablet Commonly known as: ZOCOR Take 20 mg by mouth at bedtime.   sodium chloride 1 g tablet Take 1 g by mouth 2 (two) times daily.   thiamine 100 MG tablet Take 100 mg by mouth every morning. Vitamin B1   vitamin B-12 500 MCG tablet Commonly known as: CYANOCOBALAMIN Take 500 mcg by mouth daily.   zinc oxide 20 % ointment Apply 1 application topically 3 (three) times daily as needed (apply to buttocks for redness).        Review of Systems  Unable to perform ROS: Dementia   Immunization History  Administered Date(s) Administered   Influenza-Unspecified 11/11/2019, 11/11/2020   Moderna Sars-Covid-2 Vaccination 03/06/2019, 04/03/2019, 12/18/2019, 11/11/2020   Pertinent  Health Maintenance Due  Topic Date Due   FOOT EXAM  Never done   OPHTHALMOLOGY EXAM  Never done   COLONOSCOPY (Pts 45-54yrs Insurance coverage will need to be confirmed)  Never done   MAMMOGRAM  Never done   DEXA SCAN  Never done   HEMOGLOBIN A1C  06/07/2021   INFLUENZA VACCINE  Completed   Fall Risk 10/24/2020 10/24/2020 10/25/2020 10/25/2020 10/26/2020  Patient Fall Risk Level High fall risk High fall risk High fall risk High fall risk High fall risk    Functional Status Survey:    Vitals:   01/26/21 1101  BP: 132/70  Pulse: 79  Resp: 20  Temp: 97.7 F (36.5 C)  SpO2: 95%  Weight: 168 lb 14.4 oz (76.6 kg)  Height: 5\' 7"  (1.702 m)   Body mass index is 26.45 kg/m. Physical Exam Vitals reviewed.  Constitutional:      General: She is not in acute distress. Eyes:     General:        Right eye: No discharge.        Left eye: No discharge.  Neck:     Vascular: No carotid bruit.  Cardiovascular:     Rate and Rhythm: Normal rate and regular rhythm.     Pulses: Normal pulses.     Heart sounds: Normal heart sounds. No murmur heard. Pulmonary:     Effort: Pulmonary effort is normal. No respiratory distress.  Breath sounds: Normal breath sounds. No wheezing.  Abdominal:     General: Bowel sounds are normal. There is no distension.     Palpations: Abdomen is soft.     Tenderness: There is no abdominal tenderness.  Musculoskeletal:     Cervical back: Normal range of motion.     Right lower leg: No edema.     Left lower leg: No edema.  Lymphadenopathy:     Cervical: No cervical adenopathy.  Skin:    General: Skin is warm and dry.     Capillary Refill: Capillary refill takes less than 2 seconds.  Neurological:     General: No focal deficit present.     Mental Status: She is alert. Mental status is at baseline.     Motor: Weakness present.     Gait: Gait abnormal.  Psychiatric:        Mood and Affect: Mood normal.        Behavior: Behavior normal.        Cognition and Memory: Memory is impaired.     Comments: Nonverbal during encounter, follows some commands    Labs reviewed: Recent Labs    10/23/20 1813 10/24/20 0232 10/25/20 0453 12/07/20 0000  NA 145 140 134* 136*  K 4.3 3.9 4.3 4.7  CL 111 106 104 104  CO2 24 25 21* 24*  GLUCOSE 123* 131* 109*  --   BUN 28* 26* 18 15  CREATININE 0.62 0.70 0.67 0.7  CALCIUM 10.3 9.6 8.4* 9.7  MG  --  1.6* 1.9  --    Recent Labs    03/17/20 0807 10/23/20 1813  10/24/20 0232 12/07/20 0000  AST 19 17 13* 12*  ALT 15 16 16 8   ALKPHOS 65 55 56 51  BILITOT 0.5 0.7 0.6  --   PROT 7.5 7.0 6.7  --   ALBUMIN 4.0 3.1* 3.0* 3.5   Recent Labs    03/17/20 0807 10/15/20 2151 10/23/20 1813 10/24/20 0232 10/25/20 0453 12/07/20 0000  WBC 11.0*   < > 12.5* 12.1* 10.0 10.0  NEUTROABS 8.5*  --   --  9.3*  --   --   HGB 13.2   < > 10.2* 10.7* 10.6* 11.0*  HCT 40.8   < > 32.2* 33.4* 32.7* 35*  MCV 87.4   < > 86.3 86.5 84.7  --   PLT 297   < > 308 319 312 353   < > = values in this interval not displayed.   Lab Results  Component Value Date   TSH 2.52 12/07/2020   Lab Results  Component Value Date   HGBA1C 6.1 12/07/2020   Lab Results  Component Value Date   CHOL 120 12/07/2020   HDL 47 12/07/2020   LDLCALC 50 12/07/2020   TRIG 148 12/07/2020    Significant Diagnostic Results in last 30 days:  No results found.  Assessment/Plan 1. Agitation - tic/ocd like behavior/anxiety? - at baseline during exam today - start seroquel 12.5 mg po qhs x 5 days, then increase to 25 mg  - increase ativan 0.5 mg po tid prn x 5 days - remains on celexa and Wellburtin  2. Frontotemporal dementia with behavioral disturbance (HCC) - see above - cont skilled nursing care  3. Sneezing - exam unremarkable  - start zyrtec 10 mg po qhs   4. Acquired hypothyroidism - TSH normal - cont levothyroxine  5. Type 2 diabetes mellitus with stage 2 chronic kidney disease, with long-term current use  of insulin (HCC) - A1c 6.1 12/07/2020 - cont metformin, lantus  6. Essential hypertension - controlled - cont lisinopril  7. Dysphagia, unspecified type - no recent aspirations    Family/ staff Communication: plan discussed with nurse  Labs/tests ordered:  none

## 2021-01-27 ENCOUNTER — Encounter: Payer: Self-pay | Admitting: Internal Medicine

## 2021-01-27 ENCOUNTER — Non-Acute Institutional Stay (SKILLED_NURSING_FACILITY): Payer: Medicare Other | Admitting: Internal Medicine

## 2021-01-27 DIAGNOSIS — G9340 Encephalopathy, unspecified: Secondary | ICD-10-CM

## 2021-01-27 DIAGNOSIS — F02818 Dementia in other diseases classified elsewhere, unspecified severity, with other behavioral disturbance: Secondary | ICD-10-CM

## 2021-01-27 DIAGNOSIS — G3109 Other frontotemporal dementia: Secondary | ICD-10-CM

## 2021-01-27 NOTE — Progress Notes (Signed)
Location:   [facility] Room Number: 29 Place of Service:  SNF [id number]) Provider:  Veleta Miners MD   Virgie Dad, MD  Patient Care Team: Virgie Dad, MD as PCP - General (Internal Medicine)  Extended Emergency Contact Information Primary Emergency Contact: [facility] Phone: [phone] Relation: Sister Secondary Emergency Contact: [facility] Phone: [phone] Mobile Phone: [phone] Relation: Brother  Code Status:  DNR Managed Care Goals of care: Advanced Directive information Advanced Directives 01/27/2021  Does Patient Have a Medical Advance Directive? Yes  Type of Paramedic of [place];Living will;Out of facility DNR (pink MOST or yellow form)  Does patient want to make changes to medical advance directive? No - Patient declined  Copy of [place] in Chart? Yes - validated most recent copy scanned in chart (See row information)  Pre-existing out of facility DNR order (yellow form or pink MOST form) -     Chief Complaint  Patient presents with   Acute Visit    Behaviors    HPI:  Pt is a 70 y.o. female seen today for an acute visit for Issues with behviors in the evening  Patient is in SNF for Therapy and Long term care   Assisted patient was diagnosed with frontotemporal dementia in 2019. She presented with inability to take care of herself and psychosis. 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 She is retired Marine scientist Her sister is the POA  Patient has h/o Fake sneezing and Making loud noises especially in the evening It has been very bothersome for her Neighboring Patients She was given Seroquel yesterday  and this morning she is at her baseline No Nursling issues. Appetite is stable Needs help with her ADLS Nut feeds herself Has aphasia Just says few Words.    Past Medical History:  Diagnosis Date   Dementia ([place])    Diabetes mellitus without complication ([place])    Hx of BKA, left ([place]) 10/24/2020   Thyroid disease    History reviewed. No pertinent surgical history.  No Known Allergies  Allergies as of 01/27/2021   No Known Allergies      Medication List        Accurate as of January 27, 2021 11:29 AM. If you have any questions, ask your nurse or doctor.          STOP taking these medications    buPROPion 150 MG 12 hr tablet Commonly known as: WELLBUTRIN SR Stopped by: Virgie Dad, MD       TAKE these medications    acetaminophen 500 MG tablet Commonly known as: TYLENOL Take 1,000 mg by mouth 2 (two) times daily as needed for fever (pain).   aspirin 81 MG chewable tablet Chew by mouth daily.   calcium carbonate 600 MG Tabs tablet Commonly known as: OS-CAL Take 600 mg by mouth 2 (two) times daily.   cetirizine 10 MG tablet Commonly known as: ZYRTEC Take 10 mg by mouth daily.   citalopram 10 MG tablet Commonly known as: CELEXA Take 10 mg by mouth every morning.   Cranberry 450 MG Tabs Take by mouth daily.   Docusate Sodium 100 MG capsule Take 100 mg by mouth 2 (two) times daily.   guaiFENesin  100 MG/5ML liquid Commonly known as: ROBITUSSIN Take 200 mg by mouth every 4 (four) hours as needed for cough or congestion.   Healthy Eyes/Lutein-Zeaxanthin Caps Take 1 capsule by mouth every morning.   Lantus SoloStar 100 UNIT/ML Solostar Pen Generic drug: insulin glargine Inject 6 Units into the skin daily. Hold for Blood Sugar less than 110- PRIME PEN WITH 2 UNITS PRIOR TO EACH USE-USE PEN WITHIN 28 DAYS   levothyroxine 50 MCG tablet Commonly known as: SYNTHROID Take 50 mcg by mouth daily before breakfast.   lisinopril 2.5 MG  tablet Commonly known as: ZESTRIL Take 2.5 mg by mouth every morning.   loperamide 2 MG tablet Commonly known as: IMODIUM A-D Take 2 mg by mouth 3 (three) times daily as needed for diarrhea or loose stools.   LORazepam 0.5 MG tablet Commonly known as: ATIVAN Take 0.5 mg by mouth 3 (three) times daily as needed.   melatonin 3 MG Tabs tablet Take 3 mg by mouth at bedtime.   metFORMIN 1000 MG tablet Commonly known as: GLUCOPHAGE Take 1,000 mg by mouth 2 (two) times daily.   Nyamyc powder Generic drug: nystatin Apply 1 application topically 2 (two) times daily as needed (groin rash).   polyethylene glycol 17 g packet Commonly known as: MIRALAX / GLYCOLAX Take 17 g by mouth daily as needed.   QUEtiapine 25 MG tablet Commonly known as: SEROQUEL Take 12.5 mg by mouth at bedtime.   QUEtiapine 25 MG tablet Commonly known as: SEROQUEL Take 25 mg by mouth at bedtime. Start taking on: February 01, 2021   Salonpas 3.02-19-08 % Ptch Generic drug: Camphor-Menthol-Methyl Sal Place 1 patch onto the skin See admin instructions. Apply one patch transdermally to left aka stump daily as needed for pain   simvastatin 20 MG tablet Commonly known as: ZOCOR Take 20 mg by mouth at bedtime.   sodium chloride 1 g tablet Take 1 g by mouth 2 (two) times daily.   thiamine 100 MG tablet Take 100 mg by mouth every morning. Vitamin B1   vitamin B-12 500 MCG tablet Commonly known as: CYANOCOBALAMIN Take 500 mcg by mouth daily.   zinc oxide 20 % ointment Apply 1 application topically 3 (three) times daily as needed (apply to buttocks for redness).        Review of Systems  Unable to perform ROS: Dementia   Immunization History  Administered Date(s) Administered   Influenza-Unspecified 11/11/2019, 11/11/2020   Moderna Sars-Covid-2 Vaccination 03/06/2019, 04/03/2019, 12/18/2019, 11/11/2020   Pertinent  Health Maintenance Due  Topic Date Due   FOOT EXAM  Never done   OPHTHALMOLOGY EXAM   Never done   COLONOSCOPY (Pts 45-13yrs Insurance coverage will need to be confirmed)  Never done   MAMMOGRAM  Never done   DEXA SCAN  Never done   HEMOGLOBIN A1C  06/07/2021   INFLUENZA VACCINE  Completed   Fall Risk 10/24/2020 10/24/2020 10/25/2020 10/25/2020 10/26/2020  Patient Fall Risk Level High fall risk High fall risk High fall risk High fall risk High fall risk   Functional Status Survey:    Vitals:   01/27/21 1115  BP: 132/70  Pulse: 79  Resp: 20  Temp: (!) 97.5 F (36.4 C)  SpO2: 95%  Weight: 168 lb 14.4 oz (76.6 kg)  Height: 5\' 7"  (1.702 m)   Body mass index is 26.45 kg/m. Physical Exam Vitals reviewed.  Constitutional:      Appearance: Normal appearance.  HENT:     Head: Normocephalic.  Nose: Nose normal.     Mouth/Throat:     Mouth: Mucous membranes are moist.     Pharynx: Oropharynx is clear.  Eyes:     Pupils: Pupils are equal, round, and reactive to light.  Cardiovascular:     Rate and Rhythm: Normal rate and regular rhythm.     Pulses: Normal pulses.     Heart sounds: Normal heart sounds. No murmur heard. Pulmonary:     Effort: Pulmonary effort is normal.     Breath sounds: Normal breath sounds.  Abdominal:     General: Abdomen is flat. Bowel sounds are normal.     Palpations: Abdomen is soft.  Musculoskeletal:        General: No swelling.     Cervical back: Neck supple.     Comments:  S/p Left BKA   Skin:    General: Skin is warm.  Neurological:     General: No focal deficit present.     Mental Status: She is alert.     Comments: Alert has aphasia  Does not follow commands for me   Psychiatric:        Mood and Affect: Mood normal.        Thought Content: Thought content normal.    Labs reviewed: Recent Labs    10/23/20 1813 10/24/20 0232 10/25/20 0453 12/07/20 0000  NA 145 140 134* 136*  K 4.3 3.9 4.3 4.7  CL 111 106 104 104  CO2 24 25 21* 24*  GLUCOSE 123* 131* 109*  --   BUN 28* 26* 18 15  CREATININE 0.62 0.70 0.67 0.7   CALCIUM 10.3 9.6 8.4* 9.7  MG  --  1.6* 1.9  --    Recent Labs    03/17/20 0807 10/23/20 1813 10/24/20 0232 12/07/20 0000  AST 19 17 13* 12*  ALT 15 16 16 8   ALKPHOS 65 55 56 51  BILITOT 0.5 0.7 0.6  --   PROT 7.5 7.0 6.7  --   ALBUMIN 4.0 3.1* 3.0* 3.5   Recent Labs    03/17/20 0807 10/15/20 2151 10/23/20 1813 10/24/20 0232 10/25/20 0453 12/07/20 0000  WBC 11.0*   < > 12.5* 12.1* 10.0 10.0  NEUTROABS 8.5*  --   --  9.3*  --   --   HGB 13.2   < > 10.2* 10.7* 10.6* 11.0*  HCT 40.8   < > 32.2* 33.4* 32.7* 35*  MCV 87.4   < > 86.3 86.5 84.7  --   PLT 297   < > 308 319 312 353   < > = values in this interval not displayed.   Lab Results  Component Value Date   TSH 2.52 12/07/2020   Lab Results  Component Value Date   HGBA1C 6.1 12/07/2020   Lab Results  Component Value Date   CHOL 120 12/07/2020   HDL 47 12/07/2020   LDLCALC 50 12/07/2020   TRIG 148 12/07/2020    Significant Diagnostic Results in last 30 days:  No results found.  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  Markedly severe cerebral atrophy most significant temporal parietal lobes. Clinically correlate for Alzheimer's   Sister does not want more Work up Cisco aspirin Already on statin  Recurrent UTI Recent  UA was negative   Hyperlipidemia associated with type 2 diabetes mellitus (HCC) On statin LDL 50 Acquired hypothyroidism 0.936 in 9/22 Type 2 diabetes mellitus with stage 2 chronic kidney disease, with long-term current use of insulin (HCC) Discontinue Actos A1C 6.1 Only use Lantus and Metformin to control sugars   Hyponatremia On Sodium Tabs BID H/o Pschogenic drinking   Depression Klonpin prn and Cetalopram Pressure  wound Use Zinc for now FFamily/ staff Communication:   Labs/tests ordered:

## 2021-02-01 ENCOUNTER — Non-Acute Institutional Stay (SKILLED_NURSING_FACILITY): Payer: Medicare Other | Admitting: Nurse Practitioner

## 2021-02-01 ENCOUNTER — Encounter: Payer: Self-pay | Admitting: Nurse Practitioner

## 2021-02-01 DIAGNOSIS — I10 Essential (primary) hypertension: Secondary | ICD-10-CM | POA: Diagnosis not present

## 2021-02-01 DIAGNOSIS — E871 Hypo-osmolality and hyponatremia: Secondary | ICD-10-CM | POA: Insufficient documentation

## 2021-02-01 DIAGNOSIS — E1169 Type 2 diabetes mellitus with other specified complication: Secondary | ICD-10-CM | POA: Diagnosis not present

## 2021-02-01 DIAGNOSIS — G3109 Other frontotemporal dementia: Secondary | ICD-10-CM

## 2021-02-01 DIAGNOSIS — F02818 Dementia in other diseases classified elsewhere, unspecified severity, with other behavioral disturbance: Secondary | ICD-10-CM

## 2021-02-01 DIAGNOSIS — E039 Hypothyroidism, unspecified: Secondary | ICD-10-CM

## 2021-02-01 DIAGNOSIS — E1151 Type 2 diabetes mellitus with diabetic peripheral angiopathy without gangrene: Secondary | ICD-10-CM

## 2021-02-01 DIAGNOSIS — F339 Major depressive disorder, recurrent, unspecified: Secondary | ICD-10-CM | POA: Insufficient documentation

## 2021-02-01 DIAGNOSIS — E782 Mixed hyperlipidemia: Secondary | ICD-10-CM | POA: Diagnosis not present

## 2021-02-01 NOTE — Assessment & Plan Note (Signed)
Na tab bid, Na 136 12/07/20

## 2021-02-01 NOTE — Assessment & Plan Note (Addendum)
on Seroquel 12.5mg  qd, ASA, Statin, added Vistaril 25mg  qhs 01/27/21. 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 Sleeps well some nights, loud fake sneezing at her baseline.

## 2021-02-01 NOTE — Assessment & Plan Note (Signed)
LDL 50 12/07/20, on Simvastatin 

## 2021-02-01 NOTE — Assessment & Plan Note (Signed)
takes Citalopram, Lorazepam.  

## 2021-02-01 NOTE — Assessment & Plan Note (Signed)
Blood pressure is controlled, continue Lisinopril.  

## 2021-02-01 NOTE — Assessment & Plan Note (Signed)
insulin dependent, Hgb a1c 61 12/07/20, on Lantus and Metformin

## 2021-02-01 NOTE — Assessment & Plan Note (Signed)
TSH 2.52 12/07/20, takes Levothyroxine.  

## 2021-02-01 NOTE — Progress Notes (Signed)
Location:   S[facility] Room Number: 43 Place of Service:  SNF (31) Provider: [facility] Latroya Ng NP  Mahlon Gammon, MD  Patient Care Team: Mahlon Gammon, MD as PCP - General (Internal Medicine)  Extended Emergency Contact Information Primary Emergency Contact: [facility] Phone: [phone] Relation: Sister Secondary Emergency Contact: [facility] Phone: [phone] Mobile Phone: [phone] Relation: Brother  Code Status:  DNR Goals of care: Advanced Directive information Advanced Directives 01/27/2021  Does Patient Have a Medical Advance Directive? Yes  Type of Estate agent of [place];Living will;Out of facility DNR (pink MOST or yellow form)  Does patient want to make changes to medical advance directive? No - Patient declined  Copy of Healthcare Power of Attorney in Chart? Yes - validated most recent copy scanned in chart (See row information)  Pre-existing out of facility DNR order (yellow form or pink MOST form) -     Chief Complaint  Patient presents with   Medical Management of Chronic Issues    HPI:  Pt is a 70 y.o. female seen today for medical management of chronic diseases.    Frontotemporal dementia with behavioral disturbance, on Seroquel 12.5mg  qd, ASA, Statin, added Vistaril 25mg  qhs 01/27/21, sleeps well some night. Loud fake sneezing.   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   T2DM, insulin dependent, Hgb a1c 61 12/07/20, on Lantus and Metformin  Hyponatremia, Na tab bid, Na 136 12/07/20, psychogenic drinking   Depression, takes Citalopram, Lorazepam.   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 No pertinent surgical history.  No Known Allergies  Allergies as of 02/01/2021   No Known Allergies      Medication List        Accurate as of February 01, 2021 11:59 PM. If you have any questions, ask your nurse or doctor.          acetaminophen 500 MG tablet Commonly known as: TYLENOL Take 1,000 mg by mouth 2 (two) times daily as needed for fever (pain).   aspirin 81 MG chewable tablet Chew by mouth daily.   calcium carbonate 600 MG Tabs tablet Commonly known as: OS-CAL Take 600 mg by mouth 2 (two) times daily.   citalopram 10 MG tablet Commonly known as: CELEXA Take 10 mg by mouth every morning.   Cranberry 450 MG Tabs Take by mouth daily.   Docusate Sodium 100 MG capsule Take 100 mg by mouth 2 (two) times daily.   guaiFENesin 100 MG/5ML liquid Commonly known as: ROBITUSSIN Take 200 mg by mouth every 4 (four) hours as needed for cough or congestion.   Healthy Eyes/Lutein-Zeaxanthin Caps Take 1 capsule by mouth every morning.   hydrOXYzine 25 MG capsule Commonly known as: VISTARIL Take 25 mg by mouth at bedtime. For Behaviors   Lantus SoloStar 100 UNIT/ML Solostar Pen Generic drug: insulin glargine Inject 6 Units into the skin daily. Hold for Blood Sugar less than 110- PRIME PEN WITH 2 UNITS PRIOR TO EACH USE-USE PEN WITHIN 28 DAYS   levothyroxine 50 MCG tablet Commonly known as: SYNTHROID Take 50 mcg by mouth daily before  breakfast.   lisinopril 2.5 MG tablet Commonly known as: ZESTRIL Take 2.5 mg by mouth every morning.   loperamide 2 MG tablet Commonly known as: IMODIUM A-D Take 2 mg by mouth 3 (three) times daily as needed for diarrhea or loose stools.   LORazepam 0.5 MG tablet Commonly known as: ATIVAN Take 0.5 mg by mouth 3 (three) times daily as needed.   melatonin 3 MG Tabs tablet Take 3 mg by mouth at bedtime.   metFORMIN 1000 MG  tablet Commonly known as: GLUCOPHAGE Take 1,000 mg by mouth 2 (two) times daily.   Nyamyc powder Generic drug: nystatin Apply 1 application topically 2 (two) times daily as needed (groin rash).   polyethylene glycol 17 g packet Commonly known as: MIRALAX / GLYCOLAX Take 17 g by mouth daily as needed.   Salonpas 3.02-19-08 % Ptch Generic drug: Camphor-Menthol-Methyl Sal Place 1 patch onto the skin See admin instructions. Apply one patch transdermally to left aka stump daily as needed for pain   simvastatin 20 MG tablet Commonly known as: ZOCOR Take 20 mg by mouth at bedtime.   sodium chloride 1 g tablet Take 1 g by mouth 2 (two) times daily.   thiamine 100 MG tablet Take 100 mg by mouth every morning. Vitamin B1   vitamin B-12 500 MCG tablet Commonly known as: CYANOCOBALAMIN Take 500 mcg by mouth daily.   zinc oxide 20 % ointment Apply 1 application topically 3 (three) times daily as needed (apply to buttocks for redness).        Review of Systems  Unable to perform ROS: Dementia   Immunization History  Administered Date(s) Administered   Influenza-Unspecified 11/11/2019, 11/11/2020   Moderna Sars-Covid-2 Vaccination 03/06/2019, 04/03/2019, 12/18/2019, 11/11/2020   Pertinent  Health Maintenance Due  Topic Date Due   FOOT EXAM  Never done   OPHTHALMOLOGY EXAM  Never done   COLONOSCOPY (Pts 45-59yrs Insurance coverage will need to be confirmed)  Never done   MAMMOGRAM  Never done   DEXA SCAN  Never done   HEMOGLOBIN A1C  06/07/2021   INFLUENZA VACCINE  Completed   Fall Risk 10/24/2020 10/24/2020 10/25/2020 10/25/2020 10/26/2020  Patient Fall Risk Level High fall risk High fall risk High fall risk High fall risk High fall risk   Functional Status Survey:    Vitals:   02/01/21 1522  BP: 132/70  Pulse: 68  Resp: 20  Temp: 97.7 F (36.5 C)  SpO2: 94%   There is no height or weight on file to calculate BMI. Physical Exam Vitals and nursing note reviewed.   Constitutional:      Appearance: Normal appearance.  HENT:     Head: Normocephalic and atraumatic.     Nose: Nose normal.     Mouth/Throat:     Mouth: Mucous membranes are moist.  Eyes:     Extraocular Movements: Extraocular movements intact.     Conjunctiva/sclera: Conjunctivae normal.     Pupils: Pupils are equal, round, and reactive to light.  Cardiovascular:     Rate and Rhythm: Normal rate and regular rhythm.     Heart sounds: No murmur heard. Pulmonary:     Effort: Pulmonary effort is normal.  Abdominal:     General: Bowel sounds are normal.     Palpations: Abdomen is soft.     Tenderness: There is no abdominal tenderness.  Musculoskeletal:     Cervical back: Normal range of motion and neck supple.     Right lower leg: Edema  present.     Comments: Left AKA  Skin:    General: Skin is warm and dry.  Neurological:     General: No focal deficit present.     Mental Status: She is alert. Mental status is at baseline.     Comments: Oriented to self.    Psychiatric:     Comments: Followed simple directions    Labs reviewed: Recent Labs    10/23/20 1813 10/24/20 0232 10/25/20 0453 12/07/20 0000  NA 145 140 134* 136*  K 4.3 3.9 4.3 4.7  CL 111 106 104 104  CO2 24 25 21* 24*  GLUCOSE 123* 131* 109*  --   BUN 28* 26* 18 15  CREATININE 0.62 0.70 0.67 0.7  CALCIUM 10.3 9.6 8.4* 9.7  MG  --  1.6* 1.9  --    Recent Labs    03/17/20 0807 10/23/20 1813 10/24/20 0232 12/07/20 0000  AST 19 17 13* 12*  ALT 15 16 16 8   ALKPHOS 65 55 56 51  BILITOT 0.5 0.7 0.6  --   PROT 7.5 7.0 6.7  --   ALBUMIN 4.0 3.1* 3.0* 3.5   Recent Labs    03/17/20 0807 10/15/20 2151 10/23/20 1813 10/24/20 0232 10/25/20 0453 12/07/20 0000  WBC 11.0*   < > 12.5* 12.1* 10.0 10.0  NEUTROABS 8.5*  --   --  9.3*  --   --   HGB 13.2   < > 10.2* 10.7* 10.6* 11.0*  HCT 40.8   < > 32.2* 33.4* 32.7* 35*  MCV 87.4   < > 86.3 86.5 84.7  --   PLT 297   < > 308 319 312 353   < > = values in  this interval not displayed.   Lab Results  Component Value Date   TSH 2.52 12/07/2020   Lab Results  Component Value Date   HGBA1C 6.1 12/07/2020   Lab Results  Component Value Date   CHOL 120 12/07/2020   HDL 47 12/07/2020   LDLCALC 50 12/07/2020   TRIG 148 12/07/2020    Significant Diagnostic Results in last 30 days:  No results found.  Assessment/Plan Hypothyroidism TSH 2.52 12/07/20, takes Levothyroxine.   Type 2 diabetes mellitus with peripheral vascular disease (HCC) insulin dependent, Hgb a1c 61 12/07/20, on Lantus and Metformin  Hyponatremia Na tab bid, Na 136 12/07/20  Depression, recurrent (HCC) takes Citalopram, Lorazepam  Essential hypertension Blood pressure is controlled, continue Lisinopril.   Pressure injury of skin Pressure reduction, barrie ointment for pretection  Mixed diabetic hyperlipidemia associated with type 2 diabetes mellitus (HCC) LDL 50 12/07/20, on Simvastatin  Frontotemporal dementia with behavioral disturbance (HCC) on Seroquel 12.5mg  qd, ASA, Statin, added Vistaril 25mg  qhs 01/27/21. 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 Sleeps well some nights, loud fake sneezing at her baseline.    Family/ staff Communication: plan of care reviewed with the patient and charge nurse.   Labs/tests ordered:  none   Time spend 35 minutes.

## 2021-02-01 NOTE — Assessment & Plan Note (Signed)
Pressure reduction, barrie ointment for pretection

## 2021-02-03 ENCOUNTER — Encounter: Payer: Self-pay | Admitting: Nurse Practitioner

## 2021-02-15 ENCOUNTER — Other Ambulatory Visit: Payer: Self-pay

## 2021-02-15 ENCOUNTER — Non-Acute Institutional Stay: Payer: Medicare Other | Admitting: *Deleted

## 2021-02-15 NOTE — Progress Notes (Incomplete)
[facility] COMMUNITY PALLIATIVE CARE RN NOTE  PATIENT NAME: Alexis Becker DOB: 1950/03/22 MRN: [id number]  PRIMARY CARE PROVIDER: Virgie Dad, MD  RESPONSIBLE PARTY: Alexis Becker (sister) Acct ID - Guarantor Home Phone Work Phone Relationship Acct Type  [id number] Alexis Becker [phone]  Self P/F     [address] [place], [place] [address]   Covid-19 Pre-screening Negative  PLAN OF CARE and INTERVENTION:  ADVANCE CARE PLANNING/GOALS OF CARE: Goal is for patient to remain at current facility [facility]). She has a DNR. PATIENT/CAREGIVER EDUCATION: Symptom management, safe transfers, pain management DISEASE STATUS: Follow-up RN palliative care visit completed today. Met with patient in her room. Upon arrival she is sitting up in her wheelchair awake and alert. She is much more talkative than on previous visits. She did report having some pain when asked. She states it was "all over." I made her staff nurse aware. She is now able to feed herself with minimal assistance. She has started back with some behaviors that her sister states she has exhibited in the past. She calls out frequently, fake sneezes and repeatedly beats on the bedside table with utensils. I could hear her    HISTORY OF PRESENT ILLNESS:    CODE STATUS:   Code Status: Prior  ADVANCED DIRECTIVES: N MOST FORM: {Responses; yes/no} PPS: {NUMBERS 0%-100%:21292}   PHYSICAL EXAM:   VITALS:There were no vitals filed for this visit.  LUNGS: {SYSTEM LUNGS ADULT/PED ZGYF:74944} CARDIAC: {Mis exam cardio:32073}} *** EXTREMITIES: {Exam; extremity:10330} SKIN: {Findings; skin exam-one line:31329::"Skin color, texture, turgor normal. No rashes or lesions"}  NEURO: {Findings; ROS neuro:30532::"negative"}       Alexis Eastern, RN

## 2021-02-16 ENCOUNTER — Other Ambulatory Visit: Payer: Self-pay | Admitting: Orthopedic Surgery

## 2021-02-16 DIAGNOSIS — F02818 Dementia in other diseases classified elsewhere, unspecified severity, with other behavioral disturbance: Secondary | ICD-10-CM

## 2021-02-16 DIAGNOSIS — F419 Anxiety disorder, unspecified: Secondary | ICD-10-CM

## 2021-02-16 DIAGNOSIS — G3109 Other frontotemporal dementia: Secondary | ICD-10-CM

## 2021-02-16 MED ORDER — LORAZEPAM 0.5 MG PO TABS: 1.0000 mg | ORAL_TABLET | Freq: Two times a day (BID) | ORAL | 0 refills | Status: DC

## 2021-02-16 MED ORDER — LORAZEPAM 1 MG PO TABS: 1.0000 mg | ORAL_TABLET | Freq: Two times a day (BID) | ORAL | 0 refills | Status: AC

## 2021-02-17 ENCOUNTER — Non-Acute Institutional Stay (SKILLED_NURSING_FACILITY): Payer: Medicare Other | Admitting: Orthopedic Surgery

## 2021-02-17 ENCOUNTER — Encounter: Payer: Self-pay | Admitting: Orthopedic Surgery

## 2021-02-17 DIAGNOSIS — E871 Hypo-osmolality and hyponatremia: Secondary | ICD-10-CM | POA: Diagnosis not present

## 2021-02-17 DIAGNOSIS — E1151 Type 2 diabetes mellitus with diabetic peripheral angiopathy without gangrene: Secondary | ICD-10-CM | POA: Diagnosis not present

## 2021-02-17 DIAGNOSIS — N390 Urinary tract infection, site not specified: Secondary | ICD-10-CM | POA: Diagnosis not present

## 2021-02-17 DIAGNOSIS — E1169 Type 2 diabetes mellitus with other specified complication: Secondary | ICD-10-CM

## 2021-02-17 DIAGNOSIS — F02818 Dementia in other diseases classified elsewhere, unspecified severity, with other behavioral disturbance: Secondary | ICD-10-CM

## 2021-02-17 DIAGNOSIS — G3109 Other frontotemporal dementia: Secondary | ICD-10-CM | POA: Diagnosis not present

## 2021-02-17 DIAGNOSIS — G9340 Encephalopathy, unspecified: Secondary | ICD-10-CM | POA: Diagnosis not present

## 2021-02-17 DIAGNOSIS — E785 Hyperlipidemia, unspecified: Secondary | ICD-10-CM

## 2021-02-17 DIAGNOSIS — E039 Hypothyroidism, unspecified: Secondary | ICD-10-CM | POA: Diagnosis not present

## 2021-02-17 DIAGNOSIS — F339 Major depressive disorder, recurrent, unspecified: Secondary | ICD-10-CM

## 2021-02-17 NOTE — Progress Notes (Deleted)
Location:  [place] Room Number: 29 Place of Service:  SNF [phone]) Provider:  Windell Moulding, NP  Virgie Dad, MD  Patient Care Team: Virgie Dad, MD as PCP - General (Internal Medicine)  Extended Emergency Contact Information Primary Emergency Contact: [facility] Phone: [phone] Relation: Sister Secondary Emergency Contact: [facility] Phone: [phone] Mobile Phone: [phone] Relation: Brother  Code Status:  DNR Goals of care: Advanced Directive information Advanced Directives 02/17/2021  Does Patient Have a Medical Advance Directive? Yes  Type of Advance Directive Out of facility DNR (pink MOST or yellow form)  Does patient want to make changes to medical advance directive? No - Patient declined  Copy of [place] in Chart? -  Pre-existing out of facility DNR order (yellow form or pink MOST form) -     Chief Complaint  Patient presents with   Acute Visit    Increased behaviors and anxiety    HPI:  Pt is a 71 y.o. female seen today for an acute visit for    Past Medical History:  Diagnosis Date   Dementia ([place])    Diabetes mellitus without complication ([place])    Hx of BKA, left ([place]) 10/24/2020   Thyroid disease    History reviewed. No pertinent surgical history.  No Known Allergies  Outpatient Encounter Medications as of 02/17/2021  Medication Sig   acetaminophen (TYLENOL) 500 MG tablet Take 1,000 mg by mouth 2 (two) times daily as needed for fever (pain).   AMBULATORY NON FORMULARY MEDICATION [id number] [id number]  powder in packet; 100-7.5-2.691 gram; amt: 1 Capful (17 grams); oral  Special Instructions: Mix 1 capful (17 grams) in 4-8 oz of H2O and drink po daily  as needed for constipation Once A Day - PRN   aspirin 81 MG chewable tablet Chew by mouth daily.   calcium carbonate (OS-CAL) 600 MG TABS tablet Take 600 mg by mouth 2 (two) times daily.   Camphor-Menthol-Methyl Sal  (SALONPAS) 3.02-19-08 % PTCH Place 1 patch onto the skin See admin instructions. Apply one patch transdermally to left aka stump daily as needed for pain   citalopram (CELEXA) 10 MG tablet Take 10 mg by mouth every morning.   Cranberry 450 MG TABS Take by mouth daily.   Docusate Sodium 100 MG capsule Take 100 mg by mouth 2 (two) times daily.   guaiFENesin (ROBITUSSIN) 100 MG/5ML liquid Take 200 mg by mouth every 4 (four) hours as needed for cough or congestion.   insulin glargine (LANTUS SOLOSTAR) 100 UNIT/ML Solostar Pen Inject 6 Units into the skin daily. Hold for Blood Sugar less than 110- PRIME PEN WITH 2 UNITS PRIOR TO EACH USE-USE PEN WITHIN 28 DAYS   levothyroxine (SYNTHROID) 50 MCG tablet Take 50 mcg by mouth daily before breakfast.   lisinopril (ZESTRIL) 2.5 MG tablet Take 2.5 mg by mouth every morning.   loperamide (IMODIUM A-D) 2 MG tablet Take 2 mg by mouth 3 (three) times daily as needed for diarrhea or loose stools.   LORazepam (ATIVAN) 1 MG tablet Take 1 tablet (1 mg total) by mouth 2 (two) times daily for 5 days.   melatonin 3 MG TABS tablet Take 3 mg by mouth at bedtime.   metFORMIN (GLUCOPHAGE) 1000 MG tablet Take 1,000 mg by mouth 2 (two) times daily.   Multiple Vitamins-Minerals (HEALTHY EYES/LUTEIN-ZEAXANTHIN) CAPS Take 1 capsule by mouth every morning.   NYAMYC powder Apply 1 application topically 2 (two) times daily as needed (groin rash).  polyethylene glycol (MIRALAX / GLYCOLAX) 17 g packet Take 17 g by mouth daily as needed.   QUEtiapine (SEROQUEL) 25 MG tablet Take 25 mg by mouth 2 (two) times daily.   simvastatin (ZOCOR) 20 MG tablet Take 20 mg by mouth at bedtime.   sodium chloride 1 g tablet Take 1 g by mouth 2 (two) times daily.   thiamine 100 MG tablet Take 100 mg by mouth every morning. Vitamin B1   vitamin B-12 (CYANOCOBALAMIN) 500 MCG tablet Take 500 mcg by mouth daily.   zinc oxide 20 % ointment Apply 1 application topically 3 (three) times daily as needed  (apply to buttocks for redness).   [DISCONTINUED] hydrOXYzine (VISTARIL) 25 MG capsule Take 25 mg by mouth at bedtime. For Behaviors   No facility-administered encounter medications on file as of 02/17/2021.    Review of Systems  Immunization History  Administered Date(s) Administered   Influenza-Unspecified 11/11/2019, 11/11/2020   Moderna Sars-Covid-2 Vaccination 03/06/2019, 04/03/2019, 12/18/2019, 11/11/2020   Pertinent  Health Maintenance Due  Topic Date Due   FOOT EXAM  Never done   OPHTHALMOLOGY EXAM  Never done   COLONOSCOPY (Pts 45-40yrs Insurance coverage will need to be confirmed)  Never done   MAMMOGRAM  Never done   DEXA SCAN  Never done   HEMOGLOBIN A1C  06/07/2021   INFLUENZA VACCINE  Completed   Fall Risk 10/24/2020 10/24/2020 10/25/2020 10/25/2020 10/26/2020  Patient Fall Risk Level High fall risk High fall risk High fall risk High fall risk High fall risk   Functional Status Survey:    Vitals:   02/17/21 1003  BP: 108/68  Pulse: 82  Resp: 18  Temp: (!) 97.5 F (36.4 C)  SpO2: 94%  Weight: 167 lb 6.4 oz (75.9 kg)  Height: 5\' 7"  (1.702 m)   Body mass index is 26.22 kg/m. Physical Exam  Labs reviewed: Recent Labs    10/23/20 1813 10/24/20 0232 10/25/20 0453 12/07/20 0000  NA 145 140 134* 136*  K 4.3 3.9 4.3 4.7  CL 111 106 104 104  CO2 24 25 21* 24*  GLUCOSE 123* 131* 109*  --   BUN 28* 26* 18 15  CREATININE 0.62 0.70 0.67 0.7  CALCIUM 10.3 9.6 8.4* 9.7  MG  --  1.6* 1.9  --    Recent Labs    03/17/20 0807 10/23/20 1813 10/24/20 0232 12/07/20 0000  AST 19 17 13* 12*  ALT 15 16 16 8   ALKPHOS 65 55 56 51  BILITOT 0.5 0.7 0.6  --   PROT 7.5 7.0 6.7  --   ALBUMIN 4.0 3.1* 3.0* 3.5   Recent Labs    03/17/20 0807 10/15/20 2151 10/23/20 1813 10/24/20 0232 10/25/20 0453 12/07/20 0000  WBC 11.0*   < > 12.5* 12.1* 10.0 10.0  NEUTROABS 8.5*  --   --  9.3*  --   --   HGB 13.2   < > 10.2* 10.7* 10.6* 11.0*  HCT 40.8   < > 32.2* 33.4*  32.7* 35*  MCV 87.4   < > 86.3 86.5 84.7  --   PLT 297   < > 308 319 312 353   < > = values in this interval not displayed.   Lab Results  Component Value Date   TSH 2.52 12/07/2020   Lab Results  Component Value Date   HGBA1C 6.1 12/07/2020   Lab Results  Component Value Date   CHOL 120 12/07/2020   HDL 47 12/07/2020  [place] 50 12/07/2020   TRIG 148 12/07/2020    Significant Diagnostic Results in last 30 days:  No results found.  Assessment/Plan There are no diagnoses linked to this encounter.   Family/ staff Communication: ***  Labs/tests ordered:  ***

## 2021-02-17 NOTE — Progress Notes (Signed)
Location:  [place] Room Number: 29 Place of Service:  SNF [phone]) Provider:  Windell Moulding, AGNP-C  Virgie Dad, MD  Patient Care Team: Virgie Dad, MD as PCP - General (Internal Medicine)  Extended Emergency Contact Information Primary Emergency Contact: [facility] Phone: [phone] Relation: Sister Secondary Emergency Contact: [facility] Phone: [phone] Mobile Phone: [phone] Relation: Brother  Code Status:  DNR Goals of care: Advanced Directive information Advanced Directives 02/17/2021  Does Patient Have a Medical Advance Directive? Yes  Type of Advance Directive Out of facility DNR (pink MOST or yellow form)  Does patient want to make changes to medical advance directive? No - Patient declined  Copy of [place] in Chart? -  Pre-existing out of facility DNR order (yellow form or pink MOST form) -     Chief Complaint  Patient presents with   Acute Visit    Increased behaviors     HPI:  Alexis Becker is a 71 y.o. female seen today for acute visit due to increased behaviors.   She currently resides on the skilled nursing unit at [facility] due to frontotemporal dementia. Past medical history includes: HTN, hypothyroidism, T2DM, alcohol abuse, psychosis,  wernicke-korsakoff syndrome, thrombocytopenia.   Frontotemporal dementia- she has been fake sneezing very loudly/ repeating the same phrase very loudly/ using spoon to knock table/things in the past few days, behaviors are at all times of the day, neighbors are very upset with noise, she has a history of these tics in the past per sister, she was started on Seroquel by Dr. 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 and right periventricular basal ganglia, marked severe cerebral atrophy temporal parietal lobes, no further workup per goals of care, remains on asa and statin Recurrent UTI-  hospitalized 09/10-09/13, urine culture ESBL E.coli, treated with IV meropenem and fosfomycin, sister reports she will touch genitalia often making her more susceptible to infection, nursing staff reports urinary frequency, remains on cranberry supplement for prevention HLD- LDL 50 12/07/2020, remains on statin Hypothyroidism- TSH 2.52 12/07/2020, remains on levothyroxine T2DM- A1c 6.1 12/07/2020, no recent hypoglycemia, remains on Lantus and metformin Hyponatremia- Na 136 12/07/2020, remains on sodium tablets Depression- followed by [facility], remains on Celexa and ativan prn   Past Medical History:  Diagnosis Date   Dementia ([place])    Diabetes mellitus without complication ([place])    Hx of BKA, 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 No pertinent surgical history.  No Known Allergies  Outpatient Encounter Medications as of 02/17/2021  Medication Sig   acetaminophen (TYLENOL) 500 MG tablet Take 1,000 mg by mouth 2 (two) times daily as needed for fever (pain).   AMBULATORY NON FORMULARY MEDICATION [id number]  powder in packet; 100-7.5-2.691 gram; amt: 1 Capful (17 grams); oral  Special Instructions: Mix 1 capful (17 grams) in 4-8 oz of H2O and drink po daily  as needed for constipation Once A Day - PRN   aspirin 81 MG chewable tablet Chew by mouth daily.   calcium carbonate (OS-CAL) 600 MG TABS tablet Take 600 mg by mouth 2 (two) times daily.   Camphor-Menthol-Methyl Sal (SALONPAS) 3.02-19-08 % PTCH Place 1 patch onto the skin See admin instructions. Apply one patch transdermally to left aka stump daily as needed for pain   citalopram (CELEXA) 10 MG tablet Take 10 mg by mouth every morning.   Cranberry 450  MG TABS Take by mouth daily.   Docusate Sodium 100 MG capsule Take 100 mg by mouth 2 (two) times daily.   guaiFENesin (ROBITUSSIN) 100 MG/5ML liquid Take 200 mg by mouth every 4 (four) hours as needed for cough or congestion.   insulin  glargine (LANTUS SOLOSTAR) 100 UNIT/ML Solostar Pen Inject 6 Units into the skin daily. Hold for Blood Sugar less than 110- PRIME PEN WITH 2 UNITS PRIOR TO EACH USE-USE PEN WITHIN 28 DAYS   levothyroxine (SYNTHROID) 50 MCG tablet Take 50 mcg by mouth daily before breakfast.   lisinopril (ZESTRIL) 2.5 MG tablet Take 2.5 mg by mouth every morning.   loperamide (IMODIUM A-D) 2 MG tablet Take 2 mg by mouth 3 (three) times daily as needed for diarrhea or loose stools.   LORazepam (ATIVAN) 1 MG tablet Take 1 tablet (1 mg total) by mouth 2 (two) times daily for 5 days.   melatonin 3 MG TABS tablet Take 3 mg by mouth at bedtime.   metFORMIN (GLUCOPHAGE) 1000 MG tablet Take 1,000 mg by mouth 2 (two) times daily.   Multiple Vitamins-Minerals (HEALTHY EYES/LUTEIN-ZEAXANTHIN) CAPS Take 1 capsule by mouth every morning.   NYAMYC powder Apply 1 application topically 2 (two) times daily as needed (groin rash).   polyethylene glycol (MIRALAX / GLYCOLAX) 17 g packet Take 17 g by mouth daily as needed.   QUEtiapine (SEROQUEL) 25 MG tablet Take 25 mg by mouth 2 (two) times daily.   simvastatin (ZOCOR) 20 MG tablet Take 20 mg by mouth at bedtime.   sodium chloride 1 g tablet Take 1 g by mouth 2 (two) times daily.   thiamine 100 MG tablet Take 100 mg by mouth every morning. Vitamin B1   vitamin B-12 (CYANOCOBALAMIN) 500 MCG tablet Take 500 mcg by mouth daily.   zinc oxide 20 % ointment Apply 1 application topically 3 (three) times daily as needed (apply to buttocks for redness).   [DISCONTINUED] hydrOXYzine (VISTARIL) 25 MG capsule Take 25 mg by mouth at bedtime. For Behaviors   No facility-administered encounter medications on file as of 02/17/2021.    Review of Systems  Unable to perform ROS: Dementia   Immunization History  Administered Date(s) Administered   Influenza-Unspecified 11/11/2019, 11/11/2020   Moderna Sars-Covid-2 Vaccination 03/06/2019, 04/03/2019, 12/18/2019, 11/11/2020   Pertinent  Health  Maintenance Due  Topic Date Due   FOOT EXAM  Never done   OPHTHALMOLOGY EXAM  Never done   COLONOSCOPY (Pts 45-51yrs Insurance coverage will need to be confirmed)  Never done   MAMMOGRAM  Never done   DEXA SCAN  Never done   HEMOGLOBIN A1C  06/07/2021   INFLUENZA VACCINE  Completed   Fall Risk 10/24/2020 10/24/2020 10/25/2020 10/25/2020 10/26/2020  Patient Fall Risk Level High fall risk High fall risk High fall risk High fall risk High fall risk   Functional Status Survey:    Vitals:   02/17/21 1003  BP: 108/68  Pulse: 82  Resp: 18  Temp: (!) 97.5 F (36.4 C)  SpO2: 94%  Weight: 167 lb 6.4 oz (75.9 kg)  Height: 5\' 7"  (1.702 m)   Body mass index is 26.22 kg/m. Physical Exam Vitals reviewed.  Constitutional:      General: She is not in acute distress. HENT:     Head: Normocephalic.  Eyes:     General:        Right eye: No discharge.        Left eye: No discharge.  Neck:  Vascular: No carotid bruit.  Cardiovascular:     Rate and Rhythm: Normal rate and regular rhythm.     Pulses: Normal pulses.     Heart sounds: Normal heart sounds. No murmur heard. Pulmonary:     Effort: Pulmonary effort is normal. No respiratory distress.     Breath sounds: Normal breath sounds. No wheezing.  Abdominal:     General: Bowel sounds are normal. There is no distension.     Palpations: Abdomen is soft.     Tenderness: There is no abdominal tenderness.  Genitourinary:    Comments: No CVA tenderness Musculoskeletal:     Cervical back: Normal range of motion.     Right lower leg: No edema.     Left lower leg: No edema.     Comments: Left AKA  Lymphadenopathy:     Cervical: No cervical adenopathy.  Skin:    General: Skin is warm and dry.     Capillary Refill: Capillary refill takes less than 2 seconds.  Neurological:     General: No focal deficit present.     Mental Status: She is alert. Mental status is at baseline.     Motor: Weakness present.     Gait: Gait abnormal.      Comments: Wheelchair/hoyer  Psychiatric:        Mood and Affect: Mood normal. Affect is inappropriate.        Cognition and Memory: Memory is impaired.     Comments: Aphasia, does not follow commands    Labs reviewed: Recent Labs    10/23/20 1813 10/24/20 0232 10/25/20 0453 12/07/20 0000  NA 145 140 134* 136*  K 4.3 3.9 4.3 4.7  CL 111 106 104 104  CO2 24 25 21* 24*  GLUCOSE 123* 131* 109*  --   BUN 28* 26* 18 15  CREATININE 0.62 0.70 0.67 0.7  CALCIUM 10.3 9.6 8.4* 9.7  MG  --  1.6* 1.9  --    Recent Labs    03/17/20 0807 10/23/20 1813 10/24/20 0232 12/07/20 0000  AST 19 17 13* 12*  ALT 15 16 16 8   ALKPHOS 65 55 56 51  BILITOT 0.5 0.7 0.6  --   PROT 7.5 7.0 6.7  --   ALBUMIN 4.0 3.1* 3.0* 3.5   Recent Labs    03/17/20 0807 10/15/20 2151 10/23/20 1813 10/24/20 0232 10/25/20 0453 12/07/20 0000  WBC 11.0*   < > 12.5* 12.1* 10.0 10.0  NEUTROABS 8.5*  --   --  9.3*  --   --   HGB 13.2   < > 10.2* 10.7* 10.6* 11.0*  HCT 40.8   < > 32.2* 33.4* 32.7* 35*  MCV 87.4   < > 86.3 86.5 84.7  --   PLT 297   < > 308 319 312 353   < > = values in this interval not displayed.   Lab Results  Component Value Date   TSH 2.52 12/07/2020   Lab Results  Component Value Date   HGBA1C 6.1 12/07/2020   Lab Results  Component Value Date   CHOL 120 12/07/2020   HDL 47 12/07/2020   LDLCALC 50 12/07/2020   TRIG 148 12/07/2020    Significant Diagnostic Results in last 30 days:  No results found.  Assessment/Plan 1. Frontotemporal dementia with behavioral disturbance ([place]) - fake sneezing/repeating phrases/ hitting table with spoon at all times of day - cont Seroquel - will trial increased Ativan 1 mg po bid x 5 days-  will reassess 02/21/2021 - scheduled to be seen by [facility] this week - cont skilled nursing care  2. Encephalopathy - not further workup per goals of care  3. Recurrent UTI - 09/10-09/13 hospitalized for UTI, EBSL/ E.coli- given IV meropenem and  fosfomycin  - nursing staff reports urinary frequency - UA and culture - cont cranberry supplement - encourage fluids  4. Hyperlipidemia associated with type 2 diabetes mellitus ([facility]) - LDL stable - cont statin  5. Acquired hypothyroidism - TSH stable - cont levothyroxine  6. Type 2 diabetes mellitus with peripheral vascular disease ([facility]) - A1c stable - no hypoglycemic events - cont asa, statin, ace, Lantus and metformin  7. Hyponatremia - cont sodium tablets  8. Depression, recurrent ([place]) - followed by [facility] - cont Celexa and ativan     Family/ staff Communication: plan discussed with patient, sister and nurse  Labs/tests ordered:  UA and culture

## 2021-02-23 ENCOUNTER — Other Ambulatory Visit: Payer: Self-pay | Admitting: Orthopedic Surgery

## 2021-02-23 DIAGNOSIS — G3109 Other frontotemporal dementia: Secondary | ICD-10-CM

## 2021-02-23 MED ORDER — LORAZEPAM 1 MG PO TABS: 1.0000 mg | ORAL_TABLET | Freq: Two times a day (BID) | ORAL | 1 refills | Status: DC | PRN

## 2021-02-23 NOTE — Progress Notes (Signed)
Tics/ behaviors improved during 5 day trial of increased ativan. Will continue new dose, prescription sent to pharmacy.

## 2021-03-04 ENCOUNTER — Non-Acute Institutional Stay (INDEPENDENT_AMBULATORY_CARE_PROVIDER_SITE_OTHER): Payer: Medicare Other | Admitting: Orthopedic Surgery

## 2021-03-04 ENCOUNTER — Encounter: Payer: Self-pay | Admitting: Orthopedic Surgery

## 2021-03-04 DIAGNOSIS — Z0000 Encounter for general adult medical examination without abnormal findings: Secondary | ICD-10-CM

## 2021-03-04 NOTE — Patient Instructions (Signed)
°  Alexis Becker , Thank you for taking time to come for your Medicare Wellness Visit. I appreciate your ongoing commitment to your health goals. Please review the following plan we discussed and let me know if I can assist you in the future.   These are the goals we discussed:  Goals      Absence of Fall and Fall-Related Injury     Evidence-based guidance:  Assess fall risk using a validated tool when available. Consider balance and gait impairment, muscle weakness, diminished vision or hearing, environmental hazards, presence of urinary or bowel urgency and/or incontinence.  Communicate fall injury risk to interprofessional healthcare team.  Develop a fall prevention plan with the patient and family.  Promote use of personal vision and auditory aids.  Promote reorientation, appropriate sensory stimulation, and routines to decrease risk of fall when changes in mental status are present.  Assess assistance level required for safe and effective self-care; consider referral for home care.  Encourage physical activity, such as performance of self-care at highest level of ability, strength and balance exercise program, and provision of appropriate assistive devices; refer to rehabilitation therapy.  Refer to community-based fall prevention program where available.  If fall occurs, determine the cause and revise fall injury prevention plan.  Regularly review medication contribution to fall risk; consider risk related to polypharmacy and age.  Refer to pharmacist for consultation when concerns about medications are revealed.  Balance adequate pain management with potential for oversedation.  Provide guidance related to environmental modifications.  Consider supplementation with Vitamin D.   Notes:         This is a list of the screening recommended for you and due dates:  Health Maintenance  Topic Date Due   Pneumonia Vaccine (1 - PCV) Never done   Complete foot exam   Never done   Eye exam for  diabetics  Never done   Hepatitis C Screening: USPSTF Recommendation to screen - Ages 41-79 yo.  Never done   Tetanus Vaccine  Never done   Zoster (Shingles) Vaccine (1 of 2) Never done   Colon Cancer Screening  Never done   Mammogram  Never done   DEXA scan (bone density measurement)  Never done   COVID-19 Vaccine (5 - Booster for Moderna series) 01/06/2021   Hemoglobin A1C  06/07/2021   Flu Shot  Completed   HPV Vaccine  Aged Out

## 2021-03-04 NOTE — Progress Notes (Signed)
Subjective:   Alexis Becker is a 71 y.o. female who presents for Medicare Annual (Subsequent) preventive examination.  Review of Systems     Cardiac Risk Factors include: diabetes mellitus;advanced age (>100men, >70 women);sedentary lifestyle;hypertension     Objective:    Today's Vitals   03/04/21 1436  BP: 111/69  Pulse: 98  Resp: 18  Temp: (!) 97.3 F (36.3 C)  SpO2: 96%  Weight: 167 lb 6.4 oz (75.9 kg)  Height: 5\' 7"  (1.702 m)   Body mass index is 26.22 kg/m.  Advanced Directives 03/04/2021 02/17/2021 01/27/2021 01/26/2021 01/03/2021 12/16/2020 12/06/2020  Does Patient Have a Medical Advance Directive? Yes Yes Yes Yes Yes Yes Yes  Type of Advance Directive Out of facility DNR (pink MOST or yellow form) Out of facility DNR (pink MOST or yellow form) Healthcare Power of [place];Living will;Out of facility DNR (pink MOST or yellow form) Healthcare Power of [place];Living will;Out of facility DNR (pink MOST or yellow form) Healthcare Power of [place];Living will;Out of facility DNR (pink MOST or yellow form) Healthcare Power of [place];Living will;Out of facility DNR (pink MOST or yellow form) Healthcare Power of [place];Living will;Out of facility DNR (pink MOST or yellow form)  Does patient want to make changes to medical advance directive? No - Guardian declined No - Patient declined No - Patient declined No - Patient declined No - Patient declined No - Patient declined No - Patient declined  Copy of Healthcare Power of Attorney in Chart? Yes - validated most recent copy scanned in chart (See row information) - Yes - validated most recent copy scanned in chart (See row information) Yes - validated most recent copy scanned in chart (See row information) Yes - validated most recent copy scanned in chart (See row information) Yes - validated most recent copy scanned in chart (See row information) Yes - validated most recent copy scanned in chart (See row information)  Pre-existing out  of facility DNR order (yellow form or pink MOST form) - - - - - - -    Current Medications (verified) Outpatient Encounter Medications as of 03/04/2021  Medication Sig   acetaminophen (TYLENOL) 500 MG tablet Take 1,000 mg by mouth 2 (two) times daily as needed for fever (pain).   AMBULATORY NON FORMULARY MEDICATION [id number]  powder in packet; 100-7.5-2.691 gram; amt: 1 Capful (17 grams); oral  Special Instructions: Mix 1 capful (17 grams) in 4-8 oz of H2O and drink po daily  as needed for constipation Once A Day - PRN   aspirin 81 MG chewable tablet Chew by mouth daily.   calcium carbonate (OS-CAL) 600 MG TABS tablet Take 600 mg by mouth 2 (two) times daily.   Camphor-Menthol-Methyl Sal (SALONPAS) 3.02-19-08 % PTCH Place 1 patch onto the skin See admin instructions. Apply one patch transdermally to left aka stump daily as needed for pain   citalopram (CELEXA) 10 MG tablet Take 10 mg by mouth every morning.   Cranberry 450 MG TABS Take by mouth daily.   Docusate Sodium 100 MG capsule Take 100 mg by mouth 2 (two) times daily.   guaiFENesin (ROBITUSSIN) 100 MG/5ML liquid Take 200 mg by mouth every 4 (four) hours as needed for cough or congestion.   insulin glargine (LANTUS SOLOSTAR) 100 UNIT/ML Solostar Pen Inject 6 Units into the skin daily. Hold for Blood Sugar less than 110- PRIME PEN WITH 2 UNITS PRIOR TO EACH USE-USE PEN WITHIN 28 DAYS   levothyroxine (SYNTHROID) 50 MCG tablet Take 50 mcg by  mouth daily before breakfast.   lisinopril (ZESTRIL) 2.5 MG tablet Take 2.5 mg by mouth every morning.   loperamide (IMODIUM A-D) 2 MG tablet Take 2 mg by mouth 3 (three) times daily as needed for diarrhea or loose stools.   LORazepam (ATIVAN) 1 MG tablet Take 1 tablet (1 mg total) by mouth 2 (two) times daily as needed for anxiety.   melatonin 3 MG TABS tablet Take 3 mg by mouth at bedtime.   metFORMIN (GLUCOPHAGE) 1000 MG tablet Take 1,000 mg by mouth 2 (two) times daily.    Multiple Vitamins-Minerals (HEALTHY EYES/LUTEIN-ZEAXANTHIN) CAPS Take 1 capsule by mouth every morning.   NYAMYC powder Apply 1 application topically 2 (two) times daily as needed (groin rash).   polyethylene glycol (MIRALAX / GLYCOLAX) 17 g packet Take 17 g by mouth daily as needed.   QUEtiapine (SEROQUEL) 25 MG tablet Take 25 mg by mouth 2 (two) times daily.   simvastatin (ZOCOR) 20 MG tablet Take 20 mg by mouth at bedtime.   sodium chloride 1 g tablet Take 1 g by mouth 2 (two) times daily.   thiamine 100 MG tablet Take 100 mg by mouth every morning. Vitamin B1   vitamin B-12 (CYANOCOBALAMIN) 500 MCG tablet Take 500 mcg by mouth daily.   zinc oxide 20 % ointment Apply 1 application topically 3 (three) times daily as needed (apply to buttocks for redness).   No facility-administered encounter medications on file as of 03/04/2021.    Allergies (verified) Patient has no known allergies.   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 No pertinent surgical history. Family History  Problem Relation Age of Onset   Heart disease Neg Hx    Social History   Socioeconomic History   Marital status: Widowed    Spouse name: Not on file   Number of children: Not on file   Years of education: Not on file   Highest education level: Not on file  Occupational History   Not on file  Tobacco Use   Smoking status: Former    Packs/day: 1.00    Years: 40.00    Pack years: 40.00    Types: Cigarettes    Start date: 521980    Quit date: 2019    Years since quitting: 4.0   Smokeless tobacco: Never   Tobacco comments:    Per sister  Advertising account plannerVaping Use   Vaping Use: Former  Substance and Sexual Activity   Alcohol use: Not on file   Drug use: Not on file   Sexual activity: Not on file  Other Topics Concern   Not on file  Social History Narrative   Not on  file   Social Determinants of Health   Financial Resource Strain: Low Risk    Difficulty of Paying Living Expenses: Not hard at all  Food Insecurity: Not on file  Transportation Needs: No Transportation Needs   Lack of Transportation (Medical): No   Lack of Transportation (Non-Medical): No  Physical Activity: Inactive   Days of Exercise per Week: 0 days   Minutes of Exercise per Session: 0 min  Stress: Not on file  Social Connections: Socially Isolated   Frequency of Communication with Friends and Family: Never   Frequency of Social Gatherings with Friends and Family: Never   Attends Religious Services: Never  Active Member of Clubs or Organizations: No   Attends Banker Meetings: Never   Marital Status: Widowed    Tobacco Counseling Counseling given: Not Answered Tobacco comments: Per sister   Clinical Intake:  Pre-visit preparation completed: No  Pain : Faces Faces Pain Scale: No hurt  Faces Pain Scale: No hurt  BMI - recorded: 26.22 Nutritional Status: BMI of 19-24  Normal Nutritional Risks: None Diabetes: Yes CBG done?: Yes CBG resulted in Enter/ Edit results?: No (134 this morning) Did pt. bring in CBG monitor from home?: No  How often do you need to have someone help you when you read instructions, pamphlets, or other written materials from your doctor or pharmacy?: 5 - Always What is the last grade level you completed in school?: unable to answer, aphasia,Frontotemporal dementia  Diabetic?No  Interpreter Needed?: No      Activities of Daily Living In your present state of health, do you have any difficulty performing the following activities: 03/04/2021 10/24/2020  Hearing? N N  Vision? N Y  Difficulty concentrating or making decisions? [place]  Walking or climbing stairs? Y Y  Dressing or bathing? Y Y  Doing errands, shopping? [place]  Preparing Food and eating ? Y -  Using the Toilet? Y -  In the past six months, have you accidently leaked  urine? Y -  Do you have problems with loss of bowel control? Y -  Managing your Medications? Y -  Managing your Finances? Y -  Housekeeping or managing your Housekeeping? Y -  Some recent data might be hidden    Patient Care Team: Mahlon Gammon, MD as PCP - General (Internal Medicine)  Indicate any recent Medical Services you may have received from other than [facility] providers in the past year (date may be approximate).     Assessment:   This is a routine wellness examination for [facility].  Hearing/Vision screen No results found.  Dietary issues and exercise activities discussed: Current Exercise Habits: The patient does not participate in regular exercise at present, Exercise limited by: neurologic condition(s) (Frontotemporal dementia)   Goals Addressed             This Visit's Progress    Absence of Fall and Fall-Related Injury   On track    Evidence-based guidance:  Assess fall risk using a validated tool when available. Consider balance and gait impairment, muscle weakness, diminished vision or hearing, environmental hazards, presence of urinary or bowel urgency and/or incontinence.  Communicate fall injury risk to interprofessional healthcare team.  Develop a fall prevention plan with the patient and family.  Promote use of personal vision and auditory aids.  Promote reorientation, appropriate sensory stimulation, and routines to decrease risk of fall when changes in mental status are present.  Assess assistance level required for safe and effective self-care; consider referral for home care.  Encourage physical activity, such as performance of self-care at highest level of ability, strength and balance exercise program, and provision of appropriate assistive devices; refer to rehabilitation therapy.  Refer to community-based fall prevention program where available.  If fall occurs, determine the cause and revise fall injury prevention plan.  Regularly review medication  contribution to fall risk; consider risk related to polypharmacy and age.  Refer to pharmacist for consultation when concerns about medications are revealed.  Balance adequate pain management with potential for oversedation.  Provide guidance related to environmental modifications.  Consider supplementation with Vitamin D.   Notes:  Depression Screen PHQ 2/9 Scores 03/04/2021  Exception Documentation Medical reason    Fall Risk Fall Risk  03/04/2021  Falls in the past year? 1  Number falls in past yr: 0  Injury with Fall? 0  Risk for fall due to : History of fall(s);Impaired balance/gait;Impaired mobility  Follow up Falls evaluation completed;Education provided;Falls prevention discussed    FALL RISK PREVENTION PERTAINING TO THE HOME:  Any stairs in or around the home? No  If so, are there any without handrails? No  Home free of loose throw rugs in walkways, pet beds, electrical cords, etc? Yes  Adequate lighting in your home to reduce risk of falls? Yes   ASSISTIVE DEVICES UTILIZED TO PREVENT FALLS:  Life alert? No  Use of a cane, walker or w/c? No  Grab bars in the bathroom? Yes  Shower chair or bench in shower? Yes  Elevated toilet seat or a handicapped toilet? Yes   TIMED UP AND GO:  Was the test performed? No .  Length of time to ambulate 10 feet: N/A sec.   Gait unsteady without use of assistive device, provider informed and interventions were implemented  Cognitive Function: MMSE - Mini Mental State Exam 03/04/2021  Not completed: Unable to complete     6CIT Screen 03/04/2021  What Year? 4 points  What month? 3 points  What time? 3 points  Count back from 20 4 points  Months in reverse 4 points  Repeat phrase 10 points  Total Score 28    Immunizations Immunization History  Administered Date(s) Administered   Influenza-Unspecified 11/11/2019, 11/11/2020   Moderna Sars-Covid-2 Vaccination 03/06/2019, 04/03/2019, 12/18/2019, 11/11/2020    TDAP  status: Due, Education has been provided regarding the importance of this vaccine. Advised may receive this vaccine at local pharmacy or Health Dept. Aware to provide a copy of the vaccination record if obtained from local pharmacy or Health Dept. Verbalized acceptance and understanding.  Flu Vaccine status: Up to date  Pneumococcal vaccine status: Declined,  Education has been provided regarding the importance of this vaccine but patient still declined. Advised may receive this vaccine at local pharmacy or Health Dept. Aware to provide a copy of the vaccination record if obtained from local pharmacy or Health Dept. Verbalized acceptance and understanding.   Covid-19 vaccine status: Completed vaccines  Qualifies for Shingles Vaccine? Yes   Zostavax completed No   Shingrix Completed?: No.    Education has been provided regarding the importance of this vaccine. Patient has been advised to call insurance company to determine out of pocket expense if they have not yet received this vaccine. Advised may also receive vaccine at local pharmacy or Health Dept. Verbalized acceptance and understanding.  Screening Tests Health Maintenance  Topic Date Due   Pneumonia Vaccine 4365+ Years old (1 - PCV) Never done   FOOT EXAM  Never done   OPHTHALMOLOGY EXAM  Never done   Hepatitis C Screening  Never done   TETANUS/TDAP  Never done   Zoster Vaccines- Shingrix (1 of 2) Never done   COLONOSCOPY (Pts 45-508yrs Insurance coverage will need to be confirmed)  Never done   MAMMOGRAM  Never done   DEXA SCAN  Never done   COVID-19 Vaccine (5 - Booster for Moderna series) 01/06/2021   HEMOGLOBIN A1C  06/07/2021   INFLUENZA VACCINE  Completed   HPV VACCINES  Aged Out    Health Maintenance  Health Maintenance Due  Topic Date Due   Pneumonia Vaccine 3365+ Years old (  1 - PCV) Never done   FOOT EXAM  Never done   OPHTHALMOLOGY EXAM  Never done   Hepatitis C Screening  Never done   TETANUS/TDAP  Never done    Zoster Vaccines- Shingrix (1 of 2) Never done   COLONOSCOPY (Pts 45-54yrs Insurance coverage will need to be confirmed)  Never done   MAMMOGRAM  Never done   DEXA SCAN  Never done   COVID-19 Vaccine (5 - Booster for Moderna series) 01/06/2021    Colorectal cancer screening: No longer required.   Mammogram status: No longer required due to goals of care.  Bone Density status: Completed unknown- no further studies due to frontotemporal dementia. Results reflect: Bone density results: NORMAL. Repeat every N/A years.  Lung Cancer Screening: (Low Dose CT Chest recommended if Age 58-80 years, 30 pack-year currently smoking OR have quit w/in 15years.) does not qualify.   Lung Cancer Screening Referral: No  Additional Screening:  Hepatitis C Screening: does qualify; will add to routine labs  Vision Screening: Recommended annual ophthalmology exams for early detection of glaucoma and other disorders of the eye. Is the patient up to date with their annual eye exam?  No  Who is the provider or what is the name of the office in which the patient attends annual eye exams? N/A If pt is not established with a provider, would they like to be referred to a provider to establish care? No .   Dental Screening: Recommended annual dental exams for proper oral hygiene  Community Resource Referral / Chronic Care Management: CRR required this visit?  No   CCM required this visit?  No      Plan:     I have personally reviewed and noted the following in the patients chart:   Medical and social history Use of alcohol, tobacco or illicit drugs  Current medications and supplements including opioid prescriptions.  Functional ability and status Nutritional status Physical activity Advanced directives List of other physicians Hospitalizations, surgeries, and ER visits in previous 12 months Vitals Screenings to include cognitive, depression, and falls Referrals and appointments  In addition, I  have reviewed and discussed with patient certain preventive protocols, quality metrics, and best practice recommendations. A written personalized care plan for preventive services as well as general preventive health recommendations were provided to patient.     Octavia Heir, NP   03/04/2021

## 2021-03-09 ENCOUNTER — Non-Acute Institutional Stay (SKILLED_NURSING_FACILITY): Payer: Medicare Other | Admitting: Orthopedic Surgery

## 2021-03-09 ENCOUNTER — Encounter: Payer: Self-pay | Admitting: Orthopedic Surgery

## 2021-03-09 DIAGNOSIS — E785 Hyperlipidemia, unspecified: Secondary | ICD-10-CM | POA: Diagnosis not present

## 2021-03-09 DIAGNOSIS — E1122 Type 2 diabetes mellitus with diabetic chronic kidney disease: Secondary | ICD-10-CM | POA: Diagnosis not present

## 2021-03-09 DIAGNOSIS — N182 Chronic kidney disease, stage 2 (mild): Secondary | ICD-10-CM

## 2021-03-09 DIAGNOSIS — N390 Urinary tract infection, site not specified: Secondary | ICD-10-CM

## 2021-03-09 DIAGNOSIS — F02818 Dementia in other diseases classified elsewhere, unspecified severity, with other behavioral disturbance: Secondary | ICD-10-CM | POA: Diagnosis not present

## 2021-03-09 DIAGNOSIS — G3109 Other frontotemporal dementia: Secondary | ICD-10-CM

## 2021-03-09 DIAGNOSIS — E1169 Type 2 diabetes mellitus with other specified complication: Secondary | ICD-10-CM

## 2021-03-09 DIAGNOSIS — E871 Hypo-osmolality and hyponatremia: Secondary | ICD-10-CM

## 2021-03-09 DIAGNOSIS — I10 Essential (primary) hypertension: Secondary | ICD-10-CM | POA: Diagnosis not present

## 2021-03-09 DIAGNOSIS — F339 Major depressive disorder, recurrent, unspecified: Secondary | ICD-10-CM

## 2021-03-09 DIAGNOSIS — Z794 Long term (current) use of insulin: Secondary | ICD-10-CM | POA: Diagnosis not present

## 2021-03-09 DIAGNOSIS — E039 Hypothyroidism, unspecified: Secondary | ICD-10-CM | POA: Diagnosis not present

## 2021-03-09 NOTE — Progress Notes (Signed)
Location:   [place] Room Number: 29 Place of Service:  SNF [phone]) Provider:  Windell Moulding, NP  Virgie Dad, MD  Patient Care Team: Virgie Dad, MD as PCP - General (Internal Medicine)  Extended Emergency Contact Information Primary Emergency Contact: [facility] Phone: [phone] Relation: Sister Secondary Emergency Contact: [facility] Phone: [phone] Mobile Phone: [phone] Relation: Brother  Code Status:  DNR Goals of care: Advanced Directive information Advanced Directives 03/09/2021  Does Patient Have a Medical Advance Directive? Yes  Type of Paramedic of [place];Living will;Out of facility DNR (pink MOST or yellow form)  Does patient want to make changes to medical advance directive? No - Patient declined  Copy of [place] in Chart? Yes - validated most recent copy scanned in chart (See row information)  Pre-existing out of facility DNR order (yellow form or pink MOST form) Yellow form placed in chart (order not valid for inpatient use)     Chief Complaint  Patient presents with   Medical Management of Chronic Issues    Routine follow up visit.   Health Maintenance    Discuss need for pneumonia vaccine,foot exam, eye exam, Hep c screening, teanus/tdap, shongrix vaccine, colonoscopy, mammogram, dexa scan, 3rd COVID booster    HPI:  Pt is a 71 y.o. female seen today for medical management of chronic diseases.    She currently resides on the skilled nursing unit at [facility] due to frontotemporal dementia. Past medical history includes: HTN, hypothyroidism, T2DM, alcohol abuse, psychosis,  wernicke-korsakoff syndrome, thrombocytopenia.   T2DM- A1c 6.1 12/07/2020, sugars averaging 100-170's, no recent hypoglycemia, remains on Lantus, metformin,ace, asa and statin Frontotemporal dementia- MRI 2019 remote infarcts right anterior temporal lobe and right  periventricular basal ganglia, marked severe cerebral atrophy temporal parietal lobes, recent BIMS score 3, tics of fake sneezing have improved with scheduled ativan, remains on Seroquel, no further workup per goals of care Recurrent UTI- hospitalized 09/10-09/13, urine culture ESBL E.coli, treated with IV meropenem and fosfomycin, remains on cranberry supplement for prevention HTN- BUN/creat 15/0.7 12/07/2020, remains on lisinopril HLD- LDL 50 12/07/2020, remains on statin Hypothyroidism- TSH 2.52 12/07/2020, remains on levothyroxine Hyponatremia- Na 136 12/07/2020, remains on sodium tablets Depression- followed by [facility], remains on Celexa and ativan prn  No recent falls or injuries. Ambulates with wheelchair.   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 No pertinent surgical history.  No Known Allergies  Allergies as of 03/09/2021   No Known Allergies      Medication List        Accurate as of March 09, 2021  2:40 PM. If you have any questions, ask your nurse or doctor.          acetaminophen 500 MG tablet Commonly known as: TYLENOL Take 1,000 mg by mouth 2 (two) times daily as needed for fever (pain).   AMBULATORY NON FORMULARY MEDICATION peg3350-sod sul-NaCl-KCl-asb-C  powder in packet; 100-7.5-2.691 gram; amt: 1 Capful (17 grams); oral  Special Instructions: Mix 1 capful (17 grams) in 4-8 oz of H2O and drink po daily  as needed for constipation Once A Day -  PRN   aspirin 81 MG chewable tablet Chew by mouth daily.   calcium carbonate 600 MG Tabs tablet Commonly known as: OS-CAL Take 600 mg by mouth 2 (two) times daily.   citalopram 10 MG tablet Commonly known as:  CELEXA Take 10 mg by mouth every morning.   Cranberry 450 MG Tabs Take by mouth daily.   Docusate Sodium 100 MG capsule Take 100 mg by mouth 2 (two) times daily.   guaiFENesin 100 MG/5ML liquid Commonly known as: ROBITUSSIN Take 200 mg by mouth every 4 (four) hours as needed for cough or congestion.   Healthy Eyes/Lutein-Zeaxanthin Caps Take 1 capsule by mouth every morning.   Lantus SoloStar 100 UNIT/ML Solostar Pen Generic drug: insulin glargine Inject 6 Units into the skin daily. Hold for Blood Sugar less than 110- PRIME PEN WITH 2 UNITS PRIOR TO EACH USE-USE PEN WITHIN 28 DAYS   levothyroxine 50 MCG tablet Commonly known as: SYNTHROID Take 50 mcg by mouth daily before breakfast.   lisinopril 2.5 MG tablet Commonly known as: ZESTRIL Take 2.5 mg by mouth every morning.   loperamide 2 MG tablet Commonly known as: IMODIUM A-D Take 2 mg by mouth 3 (three) times daily as needed for diarrhea or loose stools.   LORazepam 1 MG tablet Commonly known as: ATIVAN Take 1 tablet (1 mg total) by mouth 2 (two) times daily as needed for anxiety.   melatonin 3 MG Tabs tablet Take 3 mg by mouth at bedtime.   metFORMIN 1000 MG tablet Commonly known as: GLUCOPHAGE Take 1,000 mg by mouth 2 (two) times daily.   Nyamyc powder Generic drug: nystatin Apply 1 application topically 2 (two) times daily as needed (groin rash).   polyethylene glycol 17 g packet Commonly known as: MIRALAX / GLYCOLAX Take 17 g by mouth daily as needed.   QUEtiapine 25 MG tablet Commonly known as: SEROQUEL Take 25 mg by mouth 2 (two) times daily.   Salonpas 3.02-19-08 % Ptch Generic drug: Camphor-Menthol-Methyl Sal Place 1 patch onto the skin See admin instructions. Apply one patch transdermally to left aka stump daily as needed for pain   simvastatin 20 MG tablet Commonly known as: ZOCOR Take 20 mg by mouth at bedtime.   sodium chloride 1 g tablet Take 1 g by mouth 2 (two) times daily.   thiamine  100 MG tablet Take 100 mg by mouth every morning. Vitamin B1   vitamin B-12 500 MCG tablet Commonly known as: CYANOCOBALAMIN Take 500 mcg by mouth daily.   zinc oxide 20 % ointment Apply 1 application topically 3 (three) times daily as needed (apply to buttocks for redness).        Review of Systems  Unable to perform ROS: Dementia   Immunization History  Administered Date(s) Administered   Influenza-Unspecified 11/11/2019, 11/11/2020   Moderna Sars-Covid-2 Vaccination 03/06/2019, 04/03/2019, 12/18/2019, 11/11/2020   Pertinent  Health Maintenance Due  Topic Date Due   FOOT EXAM  Never done   OPHTHALMOLOGY EXAM  Never done   COLONOSCOPY (Pts 45-18yrs Insurance coverage will need to be confirmed)  Never done   MAMMOGRAM  Never done   DEXA SCAN  Never done   HEMOGLOBIN A1C  06/07/2021   INFLUENZA VACCINE  Completed   Fall Risk 10/24/2020 10/25/2020 10/25/2020 10/26/2020 03/04/2021  Falls in the past year? - - - - 1  Was there an injury with Fall? - - - - 0  Fall Risk Category Calculator - - - - 1  Fall  Risk Category - - - - Low  Patient Fall Risk Level High fall risk High fall risk High fall risk High fall risk Moderate fall risk  Patient at Risk for Falls Due to - - - - History of fall(s);Impaired balance/gait;Impaired mobility  Fall risk Follow up - - - - Falls evaluation completed;Education provided;Falls prevention discussed   Functional Status Survey:    Vitals:   03/09/21 1239  BP: 113/87  Pulse: 86  Resp: 18  Temp: (!) 97.3 F (36.3 C)  SpO2: 96%  Weight: 167 lb 6.4 oz (75.9 kg)  Height: 5\' 7"  (1.702 m)   Body mass index is 26.22 kg/m. Physical Exam Vitals reviewed.  Constitutional:      General: She is not in acute distress. HENT:     Head: Normocephalic.     Right Ear: There is no impacted cerumen.     Left Ear: There is no impacted cerumen.     Nose: Nose normal.     Mouth/Throat:     Mouth: Mucous membranes are moist.  Eyes:     General:         Right eye: No discharge.        Left eye: No discharge.  Cardiovascular:     Rate and Rhythm: Normal rate and regular rhythm.     Pulses:          Dorsalis pedis pulses are 1+ on the right side.       Posterior tibial pulses are 1+ on the right side.     Heart sounds: Normal heart sounds. No murmur heard. Pulmonary:     Effort: Pulmonary effort is normal. No respiratory distress.     Breath sounds: Normal breath sounds. No wheezing.  Abdominal:     General: Bowel sounds are normal. There is no distension.     Palpations: Abdomen is soft.     Tenderness: There is no abdominal tenderness.  Musculoskeletal:     Cervical back: Neck supple.     Right lower leg: No edema.     Right foot: Normal range of motion. No deformity.     Comments: Left AKA     Left Lower Extremity: Left leg is amputated above knee.  Feet:     Right foot:     Protective Sensation: 10 sites tested.  10 sites sensed.     Skin integrity: Skin integrity normal.     Toenail Condition: Right toenails are normal.  Skin:    General: Skin is warm and dry.     Capillary Refill: Capillary refill takes less than 2 seconds.  Neurological:     General: No focal deficit present.     Mental Status: She is alert. Mental status is at baseline.     Motor: Weakness present.     Gait: Gait abnormal.     Comments: wheelchair  Psychiatric:        Mood and Affect: Mood normal.        Behavior: Behavior normal.        Cognition and Memory: Memory is impaired.    Labs reviewed: Recent Labs    10/23/20 1813 10/24/20 0232 10/25/20 0453 12/07/20 0000  NA 145 140 134* 136*  K 4.3 3.9 4.3 4.7  CL 111 106 104 104  CO2 24 25 21* 24*  GLUCOSE 123* 131* 109*  --   BUN 28* 26* 18 15  CREATININE 0.62 0.70 0.67 0.7  CALCIUM 10.3 9.6 8.4* 9.7  MG  --  1.6* 1.9  --    Recent Labs    03/17/20 0807 10/23/20 1813 10/24/20 0232 12/07/20 0000  AST 19 17 13* 12*  ALT 15 16 16 8   ALKPHOS 65 55 56 51  BILITOT 0.5 0.7 0.6  --    PROT 7.5 7.0 6.7  --   ALBUMIN 4.0 3.1* 3.0* 3.5   Recent Labs    03/17/20 0807 10/15/20 2151 10/23/20 1813 10/24/20 0232 10/25/20 0453 12/07/20 0000  WBC 11.0*   < > 12.5* 12.1* 10.0 10.0  NEUTROABS 8.5*  --   --  9.3*  --   --   HGB 13.2   < > 10.2* 10.7* 10.6* 11.0*  HCT 40.8   < > 32.2* 33.4* 32.7* 35*  MCV 87.4   < > 86.3 86.5 84.7  --   PLT 297   < > 308 319 312 353   < > = values in this interval not displayed.   Lab Results  Component Value Date   TSH 2.52 12/07/2020   Lab Results  Component Value Date   HGBA1C 6.1 12/07/2020   Lab Results  Component Value Date   CHOL 120 12/07/2020   HDL 47 12/07/2020   LDLCALC 50 12/07/2020   TRIG 148 12/07/2020    Significant Diagnostic Results in last 30 days:  No results found.  Assessment/Plan 1. Type 2 diabetes mellitus with stage 2 chronic kidney disease, with long-term current use of insulin (HCC) - A1c 6.1 12/07/2020 - sugars< 200 - no hypoglycemic events - cont Lantus,Metformin, ace, asa, and statin  2. Frontotemporal dementia with behavioral disturbance (HCC) - tics of sneezing have improved - cont scheduled Ativan and Seroquel - cont skilled nursing care  3. Recurrent UTI - cont cranberry supplement  4. Essential hypertension - controlled - cont lisinopril  5. Hyperlipidemia associated with type 2 diabetes mellitus (HCC) - LDL at goal < 70 - cont statin  6. Acquired hypothyroidism - TSH at goal - cont levothyroxine  7. Hyponatremia - cont sodium tablets  8. Depression, recurrent ([place]) - no mood changes - cont Celexa    Family/ staff Communication: plan discussed with nurse  Labs/tests ordered:  none

## 2021-03-27 DIAGNOSIS — R2681 Unsteadiness on feet: Secondary | ICD-10-CM | POA: Diagnosis not present

## 2021-03-27 DIAGNOSIS — Z9181 History of falling: Secondary | ICD-10-CM | POA: Diagnosis not present

## 2021-03-27 DIAGNOSIS — M6281 Muscle weakness (generalized): Secondary | ICD-10-CM | POA: Diagnosis not present

## 2021-03-29 DIAGNOSIS — R2681 Unsteadiness on feet: Secondary | ICD-10-CM | POA: Diagnosis not present

## 2021-03-29 DIAGNOSIS — M6281 Muscle weakness (generalized): Secondary | ICD-10-CM | POA: Diagnosis not present

## 2021-03-29 DIAGNOSIS — Z9181 History of falling: Secondary | ICD-10-CM | POA: Diagnosis not present

## 2021-03-31 ENCOUNTER — Non-Acute Institutional Stay (SKILLED_NURSING_FACILITY): Payer: Medicare Other | Admitting: Internal Medicine

## 2021-03-31 ENCOUNTER — Encounter: Payer: Self-pay | Admitting: Internal Medicine

## 2021-03-31 DIAGNOSIS — E1169 Type 2 diabetes mellitus with other specified complication: Secondary | ICD-10-CM | POA: Diagnosis not present

## 2021-03-31 DIAGNOSIS — F339 Major depressive disorder, recurrent, unspecified: Secondary | ICD-10-CM

## 2021-03-31 DIAGNOSIS — G9340 Encephalopathy, unspecified: Secondary | ICD-10-CM | POA: Diagnosis not present

## 2021-03-31 DIAGNOSIS — E871 Hypo-osmolality and hyponatremia: Secondary | ICD-10-CM | POA: Diagnosis not present

## 2021-03-31 DIAGNOSIS — G3109 Other frontotemporal dementia: Secondary | ICD-10-CM

## 2021-03-31 DIAGNOSIS — F02818 Dementia in other diseases classified elsewhere, unspecified severity, with other behavioral disturbance: Secondary | ICD-10-CM

## 2021-03-31 DIAGNOSIS — E039 Hypothyroidism, unspecified: Secondary | ICD-10-CM | POA: Diagnosis not present

## 2021-03-31 DIAGNOSIS — E1151 Type 2 diabetes mellitus with diabetic peripheral angiopathy without gangrene: Secondary | ICD-10-CM

## 2021-03-31 DIAGNOSIS — E785 Hyperlipidemia, unspecified: Secondary | ICD-10-CM | POA: Diagnosis not present

## 2021-03-31 NOTE — Progress Notes (Signed)
Location:   [facility] Room Number: 29 Place of Service:  SNF [phone]) Provider:  Einar Crow MD  Mahlon Gammon, MD  Patient Care Team: Mahlon Gammon, MD as PCP - General (Internal Medicine)  Extended Emergency Contact Information Primary Emergency Contact: [facility] Phone: [phone] Relation: Sister Secondary Emergency Contact: [facility] Phone: [phone] Mobile Phone: [phone] Relation: Brother  Code Status:  DNR Managed Care Goals of care: Advanced Directive information Advanced Directives 03/31/2021  Does Patient Have a Medical Advance Directive? Yes  Type of Estate agent of [place];Living will;Out of facility DNR (pink MOST or yellow form)  Does patient want to make changes to medical advance directive? No - Patient declined  Copy of Healthcare Power of Attorney in Chart? Yes - validated most recent copy scanned in chart (See row information)  Pre-existing out of facility DNR order (yellow form or pink MOST form) Yellow form placed in chart (order not valid for inpatient use)     Chief Complaint Patient presents with  Medical Management of Chronic Issue  HPI:  Pt is a 71 y.o. female seen today for medical management of chronic diseases.    Patient is in SNF for Therapy and Long term care   Patient was diagnosed with frontotemporal dementia in 2019.  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 She is retired Engineer, civil (consulting) Her sister is the POA      She is stable. No new Nursing issues. Her only issue is behavior She does loud fake sneezing and repetitive noises especially in the evenings Usually they give her Ativan to control the  behavior  She was sitting in her Wheelchair and smiling  Replies with One word answers No Falls Wt Readings from Last 3 Encounters:  03/31/21 164 lb 3.2 oz (74.5 kg)  03/09/21 167 lb 6.4 oz (75.9 kg)  03/04/21 167 lb 6.4 oz (75.9 kg)     Past Medical History:  Diagnosis Date   Dementia (HCC)    Diabetes mellitus without complication (HCC)    Hx of BKA, left (HCC) 10/24/2020   Thyroid disease    History reviewed. No pertinent surgical history.  No Known Allergies  Allergies as of 03/31/2021   No Known Allergies      Medication List        Accurate as of March 31, 2021 10:46 AM. If you have any questions, ask your nurse or doctor.          acetaminophen 500 MG tablet Commonly known as: TYLENOL Take 1,000 mg by mouth 2 (two) times daily as needed for fever (pain).   AMBULATORY NON FORMULARY MEDICATION peg3350-sod sul-NaCl-KCl-asb-C  powder in packet; 100-7.5-2.691 gram; amt: 1 Capful (17 grams); oral  Special Instructions: Mix 1 capful (17 grams) in 4-8 oz of H2O and drink po daily  as needed for constipation Once A Day - PRN   aspirin 81 MG chewable tablet Chew by mouth daily.   calcium carbonate 600 MG Tabs tablet Commonly known as: OS-CAL Take 600 mg by mouth 2 (two) times daily.   citalopram 10 MG tablet Commonly known as: CELEXA Take 10 mg by mouth every morning.   Cranberry 450 MG Tabs Take by mouth daily.   Docusate Sodium  100 MG capsule Take 100 mg by mouth 2 (two) times daily.   guaiFENesin 100 MG/5ML liquid Commonly known as: ROBITUSSIN Take 200 mg by mouth every 4 (four) hours as needed for cough or congestion.   Healthy Eyes/Lutein-Zeaxanthin Caps Take 1 capsule by mouth every morning.   Lantus SoloStar 100 UNIT/ML Solostar Pen Generic drug: insulin glargine Inject 6 Units into the skin daily. Hold for Blood Sugar less than 110- PRIME PEN WITH 2 UNITS PRIOR TO EACH USE-USE PEN WITHIN 28 DAYS   levothyroxine 50 MCG tablet Commonly  known as: SYNTHROID Take 50 mcg by mouth daily before breakfast.   lisinopril 2.5 MG tablet Commonly known as: ZESTRIL Take 2.5 mg by mouth every morning.   loperamide 2 MG tablet Commonly known as: IMODIUM A-D Take 2 mg by mouth 3 (three) times daily as needed for diarrhea or loose stools.   LORazepam 1 MG tablet Commonly known as: ATIVAN Take 1 tablet (1 mg total) by mouth 2 (two) times daily as needed for anxiety.   melatonin 3 MG Tabs tablet Take 3 mg by mouth at bedtime.   metFORMIN 1000 MG tablet Commonly known as: GLUCOPHAGE Take 1,000 mg by mouth 2 (two) times daily.   Nyamyc powder Generic drug: nystatin Apply 1 application topically 2 (two) times daily as needed (groin rash).   polyethylene glycol 17 g packet Commonly known as: MIRALAX / GLYCOLAX Take 17 g by mouth daily as needed.   QUEtiapine 25 MG tablet Commonly known as: SEROQUEL Take 37.5 mg by mouth 2 (two) times daily.   Salonpas 3.02-19-08 % Ptch Generic drug: Camphor-Menthol-Methyl Sal Place 1 patch onto the skin See admin instructions. Apply one patch transdermally to left aka stump daily as needed for pain   simvastatin 20 MG tablet Commonly known as: ZOCOR Take 20 mg by mouth at bedtime.   sodium chloride 1 g tablet Take 1 g by mouth 2 (two) times daily.   thiamine 100 MG tablet Take 100 mg by mouth every morning. Vitamin B1   vitamin B-12 500 MCG tablet Commonly known as: CYANOCOBALAMIN Take 500 mcg by mouth daily.   zinc oxide 20 % ointment Apply 1 application topically 3 (three) times daily as needed (apply to buttocks for redness).        Review of Systems  Unable to perform ROS: Dementia   Immunization History  Administered Date(s) Administered   Influenza-Unspecified 11/11/2019, 11/11/2020   Moderna SARS-COV2 Booster Vaccination 02/02/2021   Moderna Sars-Covid-2 Vaccination 03/06/2019, 04/03/2019, 12/18/2019, 11/11/2020   Pertinent  Health Maintenance Due  Topic Date Due    OPHTHALMOLOGY EXAM  Never done   COLONOSCOPY (Pts 45-6043yrs Insurance coverage will need to be confirmed)  Never done   MAMMOGRAM  Never done   DEXA SCAN  Never done   HEMOGLOBIN A1C  06/07/2021   FOOT EXAM  03/09/2022   INFLUENZA VACCINE  Completed   Fall Risk 10/24/2020 10/25/2020 10/25/2020 10/26/2020 03/04/2021  Falls in the past year? - - - - 1  Was there an injury with Fall? - - - - 0  Fall Risk Category Calculator - - - - 1  Fall Risk Category - - - - Low  Patient Fall Risk Level High fall risk High fall risk High fall risk High fall risk Moderate fall risk  Patient at Risk for Falls Due to - - - - History of fall(s);Impaired balance/gait;Impaired mobility  Fall risk Follow up - - - - Falls evaluation completed;Education provided;Falls prevention  discussed   Functional Status Survey:    Vitals:   03/31/21 1037  BP: 134/64  Pulse: 91  Resp: 18  Temp: 97.8 F (36.6 C)  SpO2: 94%  Weight: 164 lb 3.2 oz (74.5 kg)  Height: 5\' 7"  (1.702 m)   Body mass index is 25.72 kg/m. Physical Exam Vitals reviewed.  Constitutional:      Appearance: Normal appearance.  HENT:     Head: Normocephalic.     Nose: Nose normal.     Mouth/Throat:     Mouth: Mucous membranes are moist.     Pharynx: Oropharynx is clear.  Eyes:     Pupils: Pupils are equal, round, and reactive to light.  Cardiovascular:     Rate and Rhythm: Normal rate and regular rhythm.     Pulses: Normal pulses.     Heart sounds: Normal heart sounds. No murmur heard. Pulmonary:     Effort: Pulmonary effort is normal.     Breath sounds: Normal breath sounds.  Abdominal:     General: Abdomen is flat. Bowel sounds are normal.     Palpations: Abdomen is soft.  Musculoskeletal:        General: No swelling.     Cervical back: Neck supple.     Comments: S/P Left AKA  Skin:    General: Skin is warm.  Neurological:     General: No focal deficit present.     Mental Status: She is alert.     Comments: Has aphasia   Psychiatric:        Mood and Affect: Mood normal.        Thought Content: Thought content normal.    Labs reviewed: Recent Labs    10/23/20 1813 10/24/20 0232 10/25/20 0453 12/07/20 0000  NA 145 140 134* 136*  K 4.3 3.9 4.3 4.7  CL 111 106 104 104  CO2 24 25 21* 24*  GLUCOSE 123* 131* 109*  --   BUN 28* 26* 18 15  CREATININE 0.62 0.70 0.67 0.7  CALCIUM 10.3 9.6 8.4* 9.7  MG  --  1.6* 1.9  --    Recent Labs    10/23/20 1813 10/24/20 0232 12/07/20 0000  AST 17 13* 12*  ALT 16 16 8   ALKPHOS 55 56 51  BILITOT 0.7 0.6  --   PROT 7.0 6.7  --   ALBUMIN 3.1* 3.0* 3.5   Recent Labs    10/23/20 1813 10/24/20 0232 10/25/20 0453 12/07/20 0000  WBC 12.5* 12.1* 10.0 10.0  NEUTROABS  --  9.3*  --   --   HGB 10.2* 10.7* 10.6* 11.0*  HCT 32.2* 33.4* 32.7* 35*  MCV 86.3 86.5 84.7  --   PLT 308 319 312 353   Lab Results  Component Value Date   TSH 2.52 12/07/2020   Lab Results  Component Value Date   HGBA1C 6.1 12/07/2020   Lab Results  Component Value Date   CHOL 120 12/07/2020   HDL 47 12/07/2020   LDLCALC 50 12/07/2020   TRIG 148 12/07/2020    Significant Diagnostic Results in last 30 days:  No results found.  Assessment/Plan 1. Frontotemporal dementia with behavioral disturbance (HCC) Controlled with Seroquel and PRN ativan has been able to control her Behaviors   2. Encephalopathy Last MRI I can find in 2019 which showed Remote infarcts seen in the right anterior temporal lobe and right periventricular basal ganglia.  Markedly severe cerebral atrophy most significant temporal parietal lobes. Clinically correlate for Alzheimer's  Sister does not want more Work up [employer] aspirin Already on statin  3. Type 2 diabetes mellitus with peripheral vascular disease (HCC) A1C good in 10/22 On Lantus and Metformin  4. Hyperlipidemia associated with type 2 diabetes mellitus (HCC) On Statin Last LDL 50 in 10/22  5. Acquired hypothyroidism TSH normal  in 10/22  6. Hyponatremia On Sodium tabs H/o Psychogenic Polydipsia  7. Depression, recurrent (HCC) On Celexa    Family/ staff Communication:   Labs/tests ordered:

## 2021-04-05 DIAGNOSIS — M6281 Muscle weakness (generalized): Secondary | ICD-10-CM | POA: Diagnosis not present

## 2021-04-05 DIAGNOSIS — Z9181 History of falling: Secondary | ICD-10-CM | POA: Diagnosis not present

## 2021-04-05 DIAGNOSIS — R2681 Unsteadiness on feet: Secondary | ICD-10-CM | POA: Diagnosis not present

## 2021-04-12 DIAGNOSIS — Z9181 History of falling: Secondary | ICD-10-CM | POA: Diagnosis not present

## 2021-04-12 DIAGNOSIS — R2681 Unsteadiness on feet: Secondary | ICD-10-CM | POA: Diagnosis not present

## 2021-04-12 DIAGNOSIS — M6281 Muscle weakness (generalized): Secondary | ICD-10-CM | POA: Diagnosis not present

## 2021-04-14 DIAGNOSIS — R2681 Unsteadiness on feet: Secondary | ICD-10-CM | POA: Diagnosis not present

## 2021-04-14 DIAGNOSIS — Z9181 History of falling: Secondary | ICD-10-CM | POA: Diagnosis not present

## 2021-04-14 DIAGNOSIS — M6281 Muscle weakness (generalized): Secondary | ICD-10-CM | POA: Diagnosis not present

## 2021-04-18 DIAGNOSIS — R2681 Unsteadiness on feet: Secondary | ICD-10-CM | POA: Diagnosis not present

## 2021-04-18 DIAGNOSIS — Z9181 History of falling: Secondary | ICD-10-CM | POA: Diagnosis not present

## 2021-04-18 DIAGNOSIS — M6281 Muscle weakness (generalized): Secondary | ICD-10-CM | POA: Diagnosis not present

## 2021-04-19 ENCOUNTER — Non-Acute Institutional Stay (SKILLED_NURSING_FACILITY): Payer: Medicare Other | Admitting: Nurse Practitioner

## 2021-04-19 ENCOUNTER — Encounter: Payer: Self-pay | Admitting: Nurse Practitioner

## 2021-04-19 DIAGNOSIS — G3109 Other frontotemporal dementia: Secondary | ICD-10-CM | POA: Diagnosis not present

## 2021-04-19 DIAGNOSIS — M6281 Muscle weakness (generalized): Secondary | ICD-10-CM | POA: Diagnosis not present

## 2021-04-19 DIAGNOSIS — E871 Hypo-osmolality and hyponatremia: Secondary | ICD-10-CM | POA: Diagnosis not present

## 2021-04-19 DIAGNOSIS — E782 Mixed hyperlipidemia: Secondary | ICD-10-CM

## 2021-04-19 DIAGNOSIS — E1151 Type 2 diabetes mellitus with diabetic peripheral angiopathy without gangrene: Secondary | ICD-10-CM

## 2021-04-19 DIAGNOSIS — E039 Hypothyroidism, unspecified: Secondary | ICD-10-CM | POA: Diagnosis not present

## 2021-04-19 DIAGNOSIS — I10 Essential (primary) hypertension: Secondary | ICD-10-CM | POA: Diagnosis not present

## 2021-04-19 DIAGNOSIS — R2681 Unsteadiness on feet: Secondary | ICD-10-CM | POA: Diagnosis not present

## 2021-04-19 DIAGNOSIS — F02818 Dementia in other diseases classified elsewhere, unspecified severity, with other behavioral disturbance: Secondary | ICD-10-CM | POA: Diagnosis not present

## 2021-04-19 DIAGNOSIS — E1169 Type 2 diabetes mellitus with other specified complication: Secondary | ICD-10-CM | POA: Diagnosis not present

## 2021-04-19 DIAGNOSIS — Z89612 Acquired absence of left leg above knee: Secondary | ICD-10-CM | POA: Diagnosis not present

## 2021-04-19 DIAGNOSIS — Z9181 History of falling: Secondary | ICD-10-CM | POA: Diagnosis not present

## 2021-04-19 NOTE — Progress Notes (Signed)
Location:   SNF [place] Room Number: 61 Place of Service:  SNF (31) Provider: [facility] Shafin Pollio NP  Virgie Dad, MD  Patient Care Team: Virgie Dad, MD as PCP - General (Internal Medicine)  Extended Emergency Contact Information Primary Emergency Contact: [facility] Phone: [phone] Relation: Sister Secondary Emergency Contact: [facility] Phone: [phone] Mobile Phone: [phone] Relation: Brother  Code Status:  DNR Goals of care: Advanced Directive information Advanced Directives 04/19/2021  Does Patient Have a Medical Advance Directive? Yes  Type of Paramedic of [place];Living will;Out of facility DNR (pink MOST or yellow form)  Does patient want to make changes to medical advance directive? No - Patient declined  Copy of [place] in Chart? Yes - validated most recent copy scanned in chart (See row information)  Pre-existing out of facility DNR order (yellow form or pink MOST form) Yellow form placed in chart (order not valid for inpatient use)     Chief Complaint  Patient presents with   Acute Visit    yelling, restlessness    HPI:  Pt is a 71 y.o. female seen today for an acute visit for yelling out persistently, Lorazepam is partially effective.   Frontotemporal dementia with behavioral disturbance, on Seroquel,  ASA, Statin, Lorazepam for Loud fake sneezing.              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              T2DM, insulin dependent, Hgb a1c 61 12/07/20, on Lantus and Metformin             Hyponatremia, Na tabs, Na 136 12/07/20, psychogenic drinking              Depression, takes Citalopram,  Lorazepam.              HTN, on Lisinopril Past Medical History:  Diagnosis Date   Dementia ([place])    Diabetes mellitus without complication ([place])    Hx of BKA, left ([place]) 10/24/2020   Thyroid disease    History reviewed. No pertinent surgical history.  No Known Allergies  Allergies as of 04/19/2021   No Known Allergies      Medication List        Accurate as of April 19, 2021 11:59 PM. If you have any questions, ask your nurse or doctor.          acetaminophen 500 MG tablet Commonly known as: TYLENOL Take 1,000 mg by mouth 2 (two) times daily as needed for fever (pain).   AMBULATORY NON FORMULARY MEDICATION peg3350-sod sul-NaCl-KCl-asb-C  powder in packet; 100-7.5-2.691 gram; amt: 1 Capful (17 grams); oral  Special Instructions: Mix 1 capful (17 grams) in 4-8 oz of H2O and drink po daily  as needed for constipation Once A Day - PRN   aspirin 81 MG chewable tablet Chew by mouth daily.   calcium carbonate 600 MG Tabs tablet Commonly known as: OS-CAL Take 600 mg by mouth 2 (two) times daily.   citalopram 10 MG tablet Commonly known  as: CELEXA Take 10 mg by mouth every morning.   Cranberry 450 MG Tabs Take by mouth daily.   Docusate Sodium 100 MG capsule Take 100 mg by mouth 2 (two) times daily.   guaiFENesin 100 MG/5ML liquid Commonly known as: ROBITUSSIN Take 200 mg by mouth every 4 (four) hours as needed for cough or congestion.   Healthy Eyes/Lutein-Zeaxanthin Caps Take 1 capsule by mouth every morning.   Lantus SoloStar 100 UNIT/ML Solostar Pen Generic drug: insulin glargine Inject 6 Units into the skin daily. Hold for Blood Sugar less than 110- PRIME PEN WITH 2 UNITS PRIOR TO EACH USE-USE PEN WITHIN 28 DAYS   levothyroxine 50 MCG tablet Commonly known as: SYNTHROID Take 50 mcg by mouth daily before breakfast.   lisinopril 2.5 MG tablet Commonly known as: ZESTRIL Take 2.5 mg by mouth every morning.   loperamide 2 MG tablet Commonly known as:  IMODIUM A-D Take 2 mg by mouth 3 (three) times daily as needed for diarrhea or loose stools.   LORazepam 1 MG tablet Commonly known as: ATIVAN Take 1 tablet (1 mg total) by mouth 2 (two) times daily as needed for anxiety.   melatonin 3 MG Tabs tablet Take 3 mg by mouth at bedtime.   metFORMIN 1000 MG tablet Commonly known as: GLUCOPHAGE Take 1,000 mg by mouth 2 (two) times daily.   Nyamyc powder Generic drug: nystatin Apply 1 application topically 2 (two) times daily as needed (groin rash).   polyethylene glycol 17 g packet Commonly known as: MIRALAX / GLYCOLAX Take 17 g by mouth daily as needed.   QUEtiapine 25 MG tablet Commonly known as: SEROQUEL Take 37.5 mg by mouth 2 (two) times daily.   Salonpas 3.02-19-08 % Ptch Generic drug: Camphor-Menthol-Methyl Sal Place 1 patch onto the skin See admin instructions. Apply one patch transdermally to left aka stump daily as needed for pain   simvastatin 20 MG tablet Commonly known as: ZOCOR Take 20 mg by mouth at bedtime.   sodium chloride 1 g tablet Take 1 g by mouth 2 (two) times daily.   thiamine 100 MG tablet Take 100 mg by mouth every morning. Vitamin B1   vitamin B-12 500 MCG tablet Commonly known as: CYANOCOBALAMIN Take 500 mcg by mouth daily.   zinc oxide 20 % ointment Apply 1 application topically 3 (three) times daily as needed (apply to buttocks for redness).        Review of Systems  Unable to perform ROS: Dementia   Immunization History  Administered Date(s) Administered   Influenza-Unspecified 11/11/2019, 11/11/2020   Moderna SARS-COV2 Booster Vaccination 02/02/2021   Moderna Sars-Covid-2 Vaccination 03/06/2019, 04/03/2019, 12/18/2019, 11/11/2020   Pertinent  Health Maintenance Due  Topic Date Due   OPHTHALMOLOGY EXAM  Never done   COLONOSCOPY (Pts 45-60yr Insurance coverage will need to be confirmed)  Never done   MAMMOGRAM  Never done   DEXA SCAN  Never done   HEMOGLOBIN A1C  06/07/2021    FOOT EXAM  03/09/2022   INFLUENZA VACCINE  Completed   Fall Risk 10/24/2020 10/25/2020 10/25/2020 10/26/2020 03/04/2021  Falls in the past year? - - - - 1  Was there an injury with Fall? - - - - 0  Fall Risk Category Calculator - - - - 1  Fall Risk Category - - - - Low  Patient Fall Risk Level High fall risk High fall risk High fall risk High fall risk Moderate fall risk  Patient at Risk for Falls Due to - - - -  History of fall(s);Impaired balance/gait;Impaired mobility  Fall risk Follow up - - - - Falls evaluation completed;Education provided;Falls prevention discussed   Functional Status Survey:    Vitals:   04/19/21 1649  BP: 100/73  Pulse: 64  Resp: 19  Temp: (!) 97.4 F (36.3 C)  SpO2: 95%  Weight: 164 lb 11.2 oz (74.7 kg)  Height: _0  (1.702 m)   Body mass index is 25.8 kg/m. Physical Exam Vitals and nursing note reviewed.  Constitutional:      Appearance: Normal appearance.  HENT:     Head: Normocephalic and atraumatic.     Nose: Nose normal.     Mouth/Throat:     Mouth: Mucous membranes are moist.  Eyes:     Extraocular Movements: Extraocular movements intact.     Conjunctiva/sclera: Conjunctivae normal.     Pupils: Pupils are equal, round, and reactive to light.  Cardiovascular:     Rate and Rhythm: Normal rate and regular rhythm.     Heart sounds: No murmur heard. Pulmonary:     Effort: Pulmonary effort is normal.  Abdominal:     General: Bowel sounds are normal. There is no distension.     Palpations: Abdomen is soft.     Tenderness: There is no guarding.     Comments: ? Abd tenderness-say yes to many different things.   Musculoskeletal:     Cervical back: Normal range of motion and neck supple.     Right lower leg: No edema.     Comments: Left AKA  Skin:    General: Skin is warm and dry.  Neurological:     General: No focal deficit present.     Mental Status: She is alert. Mental status is at baseline.     Comments: Oriented to self.     Psychiatric:     Comments: Followed simple directions    Labs reviewed: Recent Labs    10/23/20 1813 10/24/20 0232 10/25/20 0453 12/07/20 0000  NA 145 140 134* 136*  K 4.3 3.9 4.3 4.7  CL 111 106 104 104  CO2 24 25 21* 24*  GLUCOSE 123* 131* 109*  --   BUN 28* 26* 18 15  CREATININE 0.62 0.70 0.67 0.7  CALCIUM 10.3 9.6 8.4* 9.7  MG  --  1.6* 1.9  --    Recent Labs    10/23/20 1813 10/24/20 0232 12/07/20 0000  AST 17 13* 12*  ALT _1 ALKPHOS 55 56 51  BILITOT 0.7 0.6  --   PROT 7.0 6.7  --   ALBUMIN 3.1* 3.0* 3.5   Recent Labs    10/23/20 1813 10/24/20 0232 10/25/20 0453 12/07/20 0000  WBC 12.5* 12.1* 10.0 10.0  NEUTROABS  --  9.3*  --   --   HGB 10.2* 10.7* 10.6* 11.0*  HCT 32.2* 33.4* 32.7* 35*  MCV 86.3 86.5 84.7  --   PLT 308 319 312 353   Lab Results  Component Value Date   TSH 2.52 12/07/2020   Lab Results  Component Value Date   HGBA1C 6.1 12/07/2020   Lab Results  Component Value Date   CHOL 120 12/07/2020   HDL 47 12/07/2020   LDLCALC 50 12/07/2020   TRIG 148 12/07/2020    Significant Diagnostic Results in last 30 days:  No results found.  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 yelling out persistently, will increase Lorazepam 31m tid as needed. Update CBC/diff, CMP/eGFR.   Mixed diabetic hyperlipidemia associated with type 2 diabetes mellitus ([place]  LDL 50 12/07/20, on Simvastatin  Hypothyroidism , TSH 2.52 12/07/20, takes Levothyroxine.   Type 2 diabetes mellitus with peripheral vascular disease (HCC) insulin dependent, Hgb a1c 61 12/07/20, on Lantus and Metformin  S/P AKA (above knee amputation), left (HCC) Skin  intact  Hyponatremia Na tabs, Na 136 12/07/20, psychogenic drinking   Essential hypertension Blood pressure is controlled, continue Lisinopril.     Family/ staff Communication: plan of care reviewed with the patient and charge nurse.   Labs/tests ordered:  CBC/diff, CMP/eGFR  Time spend 35 minutes.

## 2021-04-19 NOTE — Assessment & Plan Note (Signed)
Na tabs, Na 136 12/07/20, psychogenic drinking  ?

## 2021-04-19 NOTE — Assessment & Plan Note (Signed)
Skin intact

## 2021-04-19 NOTE — Assessment & Plan Note (Signed)
insulin dependent, Hgb a1c 61 12/07/20, on Lantus and Metformin °

## 2021-04-19 NOTE — Assessment & Plan Note (Signed)
Blood pressure is controlled, continue Lisinopril.  

## 2021-04-19 NOTE — Assessment & Plan Note (Signed)
,   TSH 2.52 12/07/20, takes Levothyroxine.  ?

## 2021-04-19 NOTE — Assessment & Plan Note (Signed)
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 yelling out persistently, will increase Lorazepam 1mg tid as needed. Update CBC/diff, CMP/eGFR.  ?

## 2021-04-19 NOTE — Assessment & Plan Note (Signed)
LDL 50 12/07/20, on Simvastatin 

## 2021-04-21 ENCOUNTER — Encounter: Payer: Self-pay | Admitting: Nurse Practitioner

## 2021-04-21 ENCOUNTER — Non-Acute Institutional Stay (SKILLED_NURSING_FACILITY): Payer: Medicare Other | Admitting: Internal Medicine

## 2021-04-21 DIAGNOSIS — F02818 Dementia in other diseases classified elsewhere, unspecified severity, with other behavioral disturbance: Secondary | ICD-10-CM

## 2021-04-21 DIAGNOSIS — Z89612 Acquired absence of left leg above knee: Secondary | ICD-10-CM

## 2021-04-21 DIAGNOSIS — E039 Hypothyroidism, unspecified: Secondary | ICD-10-CM

## 2021-04-21 DIAGNOSIS — E1151 Type 2 diabetes mellitus with diabetic peripheral angiopathy without gangrene: Secondary | ICD-10-CM | POA: Diagnosis not present

## 2021-04-21 DIAGNOSIS — G3109 Other frontotemporal dementia: Secondary | ICD-10-CM

## 2021-04-21 DIAGNOSIS — E871 Hypo-osmolality and hyponatremia: Secondary | ICD-10-CM | POA: Diagnosis not present

## 2021-04-21 DIAGNOSIS — D696 Thrombocytopenia, unspecified: Secondary | ICD-10-CM | POA: Diagnosis not present

## 2021-04-21 LAB — HEPATIC FUNCTION PANEL
ALT: 11 U/L (ref 7–35)
AST: 14 (ref 13–35)
Alkaline Phosphatase: 49 (ref 25–125)
Bilirubin, Total: 0.3

## 2021-04-21 LAB — BASIC METABOLIC PANEL
BUN: 30 — AB (ref 4–21)
CO2: 21 (ref 13–22)
Chloride: 106 (ref 99–108)
Creatinine: 0.7 (ref 0.5–1.1)
Glucose: 105
Potassium: 4.7 mEq/L (ref 3.5–5.1)
Sodium: 139 (ref 137–147)

## 2021-04-21 LAB — CBC AND DIFFERENTIAL
HCT: 41 (ref 36–46)
Hemoglobin: 13.5 (ref 12.0–16.0)
Neutrophils Absolute: 5605
Platelets: 282 10*3/uL (ref 150–400)
WBC: 8.1

## 2021-04-21 LAB — COMPREHENSIVE METABOLIC PANEL
Albumin: 4 (ref 3.5–5.0)
Calcium: 10 (ref 8.7–10.7)
Globulin: 2.7
eGFR: 89

## 2021-04-21 LAB — CBC: RBC: 4.68 (ref 3.87–5.11)

## 2021-04-22 ENCOUNTER — Telehealth: Payer: Self-pay

## 2021-04-22 ENCOUNTER — Encounter: Payer: Self-pay | Admitting: Internal Medicine

## 2021-04-22 ENCOUNTER — Encounter: Payer: Self-pay | Admitting: Orthopedic Surgery

## 2021-04-22 ENCOUNTER — Non-Acute Institutional Stay (SKILLED_NURSING_FACILITY): Payer: Medicare Other | Admitting: Orthopedic Surgery

## 2021-04-22 DIAGNOSIS — E039 Hypothyroidism, unspecified: Secondary | ICD-10-CM | POA: Diagnosis not present

## 2021-04-22 DIAGNOSIS — N390 Urinary tract infection, site not specified: Secondary | ICD-10-CM

## 2021-04-22 DIAGNOSIS — E1151 Type 2 diabetes mellitus with diabetic peripheral angiopathy without gangrene: Secondary | ICD-10-CM

## 2021-04-22 DIAGNOSIS — E871 Hypo-osmolality and hyponatremia: Secondary | ICD-10-CM | POA: Diagnosis not present

## 2021-04-22 DIAGNOSIS — E1169 Type 2 diabetes mellitus with other specified complication: Secondary | ICD-10-CM

## 2021-04-22 DIAGNOSIS — E782 Mixed hyperlipidemia: Secondary | ICD-10-CM | POA: Diagnosis not present

## 2021-04-22 DIAGNOSIS — Z66 Do not resuscitate: Secondary | ICD-10-CM

## 2021-04-22 DIAGNOSIS — F02818 Dementia in other diseases classified elsewhere, unspecified severity, with other behavioral disturbance: Secondary | ICD-10-CM

## 2021-04-22 DIAGNOSIS — I10 Essential (primary) hypertension: Secondary | ICD-10-CM | POA: Diagnosis not present

## 2021-04-22 DIAGNOSIS — G3109 Other frontotemporal dementia: Secondary | ICD-10-CM

## 2021-04-22 DIAGNOSIS — Z89612 Acquired absence of left leg above knee: Secondary | ICD-10-CM

## 2021-04-22 DIAGNOSIS — F339 Major depressive disorder, recurrent, unspecified: Secondary | ICD-10-CM

## 2021-04-22 NOTE — Progress Notes (Deleted)
?Location:  [facility] ?Nursing Home Room Number: N29/A ?Place of Service:  SNF (31) ?Provider: Octavia Heir, NP ? ?Patient Care Team: ?Mahlon Gammon, MD as PCP - General (Internal Medicine) ? ?Extended Emergency Contact Information ?Primary Emergency Contact: Dara Lords ?Mobile Phone: [phone] ?Relation: Sister ?Secondary Emergency Contact: Kennedy,Michael ?Home Phone: [phone] ?Mobile Phone: [phone] ?Relation: Brother ? ?Code Status:  DNR ?Goals of care: Advanced Directive information ?Advanced Directives 04/22/2021  ?Does Patient Have a Medical Advance Directive? Yes  ?Type of Estate agent of [place];Living will;Out of facility DNR (pink MOST or yellow form)  ?Does patient want to make changes to medical advance directive? No - Patient declined  ?Copy of Healthcare Power of Attorney in Chart? Yes - validated most recent copy scanned in chart (See row information)  ?Pre-existing out of facility DNR order (yellow form or pink MOST form) Yellow form placed in chart (order not valid for inpatient use)  ? ? ? ?Chief Complaint  ?Patient presents with  ? Medical Management of Chronic Issues  ?  Routine visit. Discuss the need for eye exam, Dexa scan, Hep C screening, mammogram, colonoscopy, Shingrix, TDAP, and Pneumonia vaccine, or post pone if patient refuses.   ? ? ?HPI:  ?Pt is a 71 y.o. female seen today for medical management of chronic diseases.   ? ? ?Past Medical History:  ?Diagnosis Date  ? Dementia (HCC)   ? Diabetes mellitus without complication (HCC)   ? 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 Thyroid disease   ? ?History reviewed. No pertinent surgical history. ? ?No Known Allergies ? ?Outpatient Encounter Medications as of 04/22/2021  ?Medication Sig  ? acetaminophen (TYLENOL) 500 MG tablet Take 1,000 mg by mouth 2 (two) times daily as needed for fever (pain).  ? AMBULATORY NON FORMULARY MEDICATION peg3350-sod sul-NaCl-KCl-asb-C  ?powder in packet; 100-7.5-2.691  gram; amt: 1 Capful (17 grams); oral  ?Special Instructions: Mix 1 capful (17 grams) in 4-8 oz of H2O and drink po daily  ?as needed for constipation ?Once A Day - PRN  ? aspirin 81 MG chewable tablet Chew by mouth daily.  ? calcium carbonate (OS-CAL) 600 MG TABS tablet Take 600 mg by mouth daily with breakfast.  ? Camphor-Menthol-Methyl Sal (SALONPAS) 3.02-19-08 % PTCH Place 1 patch onto the skin See admin instructions. Apply one patch transdermally to left aka stump daily as needed for pain  ? citalopram (CELEXA) 10 MG tablet Take 10 mg by mouth every morning.  ? Cranberry 450 MG TABS Take by mouth daily.  ? divalproex (DEPAKOTE SPRINKLE) 125 MG capsule Take 125 mg by mouth 3 (three) times daily.  ? Docusate Sodium 100 MG capsule Take 100 mg by mouth 2 (two) times daily.  ? guaiFENesin (ROBITUSSIN) 100 MG/5ML liquid Take 200 mg by mouth every 4 (four) hours as needed for cough or congestion.  ? insulin glargine (LANTUS SOLOSTAR) 100 UNIT/ML Solostar Pen Inject 6 Units into the skin daily. Hold for Blood Sugar less than 110- PRIME PEN WITH 2 UNITS PRIOR TO EACH USE-USE PEN WITHIN 28 DAYS  ? levothyroxine (SYNTHROID) 50 MCG tablet Take 50 mcg by mouth daily before breakfast.  ? lisinopril (ZESTRIL) 2.5 MG tablet Take 2.5 mg by mouth every morning.  ? loperamide (IMODIUM A-D) 2 MG tablet Take 2 mg by mouth every 8 (eight) hours as needed for diarrhea or loose stools.  ? LORazepam (ATIVAN) 1 MG tablet Take 1 mg by mouth in the morning, at noon, in  the evening, and at bedtime.  ? melatonin 3 MG TABS tablet Take 3 mg by mouth at bedtime.  ? metFORMIN (GLUCOPHAGE) 1000 MG tablet Take 1,000 mg by mouth 2 (two) times daily.  ? Multiple Vitamins-Minerals (HEALTHY EYES/LUTEIN-ZEAXANTHIN) CAPS Take 1 capsule by mouth every morning.  ? NYAMYC powder Apply 1 application topically 2 (two) times daily as needed (groin rash).  ? polyethylene glycol (MIRALAX / GLYCOLAX) 17 g packet Take 17 g by mouth daily as needed.  ? QUEtiapine  (SEROQUEL) 25 MG tablet Take 37.5 mg by mouth 2 (two) times daily.  ? simvastatin (ZOCOR) 20 MG tablet Take 20 mg by mouth at bedtime.  ? sodium chloride 1 g tablet Take 1 g by mouth 2 (two) times daily.  ? Sodium Fluoride (PREVIDENT 5000 BOOSTER PLUS) 1.1 % PSTE Place onto teeth daily. Brush on teeth with a toothbrush after PM mouth care. Spit out excess and do not rinse  ? thiamine 100 MG tablet Take 100 mg by mouth every morning. Vitamin B1  ? vitamin B-12 (CYANOCOBALAMIN) 500 MCG tablet Take 500 mcg by mouth daily.  ? zinc oxide 20 % ointment Apply 1 application topically 3 (three) times daily as needed (apply to buttocks for redness).  ? [DISCONTINUED] LORazepam (ATIVAN) 1 MG tablet Take 1 tablet (1 mg total) by mouth 2 (two) times daily as needed for anxiety. (Patient taking differently: Take 1 mg by mouth in the morning, at noon, in the evening, and at bedtime.)  ? ?No facility-administered encounter medications on file as of 04/22/2021.  ? ? ?Review of Systems ? ?Immunization History  ?Administered Date(s) Administered  ? Influenza-Unspecified 11/11/2019, 11/11/2020  ? Moderna SARS-COV2 Booster Vaccination 02/02/2021  ? Moderna Sars-Covid-2 Vaccination 03/06/2019, 04/03/2019, 12/18/2019, 11/11/2020  ? ?Pertinent  Health Maintenance Due  ?Topic Date Due  ? OPHTHALMOLOGY EXAM  Never done  ? COLONOSCOPY (Pts 45-75yrs Insurance coverage will need to be confirmed)  Never done  ? MAMMOGRAM  Never done  ? DEXA SCAN  Never done  ? HEMOGLOBIN A1C  06/07/2021  ? FOOT EXAM  03/09/2022  ? INFLUENZA VACCINE  Completed  ? ?Fall Risk 10/24/2020 10/25/2020 10/25/2020 10/26/2020 03/04/2021  ?Falls in the past year? - - - - 1  ?Was there an injury with Fall? - - - - 0  ?Fall Risk Category Calculator - - - - 1  ?Fall Risk Category - - - - Low  ?Patient Fall Risk Level High fall risk High fall risk High fall risk High fall risk Moderate fall risk  ?Patient at Risk for Falls Due to - - - - History of fall(s);Impaired  balance/gait;Impaired mobility  ?Fall risk Follow up - - - - Falls evaluation completed;Education provided;Falls prevention discussed  ? ?Functional Status Survey: ?  ? ?Vitals:  ? 04/22/21 1040  ?BP: 106/66  ?Pulse: 96  ?Resp: 16  ?Temp: 97.8 ?F (36.6 ?C)  ?SpO2: 95%  ?Weight: 164 lb 11.2 oz (74.7 kg)  ?Height: 5\' 7"  (1.702 m)  ? ?Body mass index is 25.8 kg/m? ?Physical Exam ? ?Labs reviewed: ?Recent Labs  ?  10/23/20 ?1813 10/24/20 ?0232 10/25/20 ?0453 12/07/20 ?0000 04/21/21 ?0000  ?NA 145 140 134* 136* 139  ?K 4.3 3.9 4.3 4.7 4.7  ?CL 111 106 104 104 106  ?CO2 24 25 21* 24* 21  ?GLUCOSE 123* 131* 109*  --   --   ?BUN 28* 26* 18 15 30*  ?CREATININE 0.62 0.70 0.67 0.7 0.7  ?CALCIUM 10.3 9.6 8.4*  9.7 10.0  ?MG  --  1.6* 1.9  --   --   ? ?Recent Labs  ?  10/23/20 ?1813 10/24/20 ?0232 12/07/20 ?0000 04/21/21 ?0000  ?AST 17 13* 12* 14  ?ALT 16 16 8 11   ?ALKPHOS 55 56 51 49  ?BILITOT 0.7 0.6  --   --   ?PROT 7.0 6.7  --   --   ?ALBUMIN 3.1* 3.0* 3.5 4.0  ? ?Recent Labs  ?  10/23/20 ?1813 10/24/20 ?0232 10/25/20 ?0453 12/07/20 ?0000 04/21/21 ?0000  ?WBC 12.5* 12.1* 10.0 10.0 8.1  ?NEUTROABS  --  9.3*  --   --  5,605.00  ?HGB 10.2* 10.7* 10.6* 11.0* 13.5  ?HCT 32.2* 33.4* 32.7* 35* 41  ?MCV 86.3 86.5 84.7  --   --   ?PLT 308 319 312 353 282  ? ?Lab Results  ?Component Value Date  ? TSH 2.52 12/07/2020  ? ?Lab Results  ?Component Value Date  ? HGBA1C 6.1 12/07/2020  ? ?Lab Results  ?Component Value Date  ? CHOL 120 12/07/2020  ? HDL 47 12/07/2020  ? LDLCALC 50 12/07/2020  ? TRIG 148 12/07/2020  ? ? ?Significant Diagnostic Results in last 30 days:  ?No results found. ? ?Assessment/Plan ?1. Do not resuscitate ?*** ? ? ? ?Family/ staff Communication: *** ? ?Labs/tests ordered:  *** ? ? ? ?

## 2021-04-22 NOTE — Progress Notes (Signed)
Location:  [facility] Room Number: N29/A Place of Service:  SNF (31) Provider:Tyjai Charbonnet Norval Gable, NP  Patient Care Team: Mahlon Gammon, MD as PCP - General (Internal Medicine)  Extended Emergency Contact Information Primary Emergency Contact: [employer] Phone: [phone] Relation: Sister Secondary Emergency Contact: [facility] Phone: [phone] Mobile Phone: [phone] Relation: Brother  Code Status:  DNR Goals of care: Advanced Directive information Advanced Directives 04/22/2021  Does Patient Have a Medical Advance Directive? Yes  Type of Estate agent of [place];Living will;Out of facility DNR (pink MOST or yellow form)  Does patient want to make changes to medical advance directive? No - Patient declined  Copy of Healthcare Power of Attorney in Chart? Yes - validated most recent copy scanned in chart (See row information)  Pre-existing out of facility DNR order (yellow form or pink MOST form) Yellow form placed in chart (order not valid for inpatient use)     Chief Complaint  Patient presents with   Medical Management of Chronic Issues    Routine visit. Discuss the need for eye exam, Dexa scan, Hep C screening, mammogram, colonoscopy, Shingrix, TDAP, and Pneumonia vaccine, or post pone if patient refuses.     HPI:  Pt is a 71 y.o. female seen today for medical management of chronic diseases.    She currently resides on the skilled nursing unit at [facility] due to frontotemporal dementia. Past medical history includes: HTN, hypothyroidism, T2DM, alcohol abuse, psychosis,  wernicke-korsakoff syndrome, thrombocytopenia.   T2DM- A1c 6.1 12/07/2020, sugars averaging 100-150's, no recent hypoglycemia, remains on Lantus, metformin,ace, asa and statin Frontotemporal dementia- MRI 2019 remote infarcts right anterior temporal lobe and right periventricular basal ganglia, marked severe cerebral atrophy temporal  parietal lobes, BIMS score 3, ambulates with wheelchair, tics of fake sneezing have increased in the past month, started on Depakote- behaviors have improved, remains on Seroquel, no further workup per goals of care  Recurrent UTI- H/o ESBL E.coli,  remains on cranberry supplement for prevention HTN- BUN/creat 30/0.7 04/21/2021, remains on lisinopril HLD- LDL 50 12/07/2020, remains on statin Hypothyroidism- TSH 2.52 12/07/2020, remains on levothyroxine Hyponatremia- Na 139 04/21/2021, remains on sodium tablets Depression- followed by [facility] psych, remains on Celexa and ativan prn  No recent falls or injuries.   Recent blood pressures:  03/07- 106/66  02/28- 100/73  02/21- 143/72  Recent weights:   03/01- 164.7 lbs  02/01- 164.2 lbs  01/03- 167.4 lbs   Attempted to discuss care gaps with HPOA/ no answer on phone.     Past Medical History:  Diagnosis Date   Dementia (HCC)    Diabetes mellitus without complication (HCC)    Hx of BKA, left (HCC) 10/24/2020   Thyroid disease    History reviewed. No pertinent surgical history.  No Known Allergies  Outpatient Encounter Medications as of 04/22/2021  Medication Sig   acetaminophen (TYLENOL) 500 MG tablet Take 1,000 mg by mouth 2 (two) times daily as needed for fever (pain).   AMBULATORY NON FORMULARY MEDICATION peg3350-sod sul-NaCl-KCl-asb-C  powder in packet; 100-7.5-2.691 gram; amt: 1 Capful (17 grams); oral  Special Instructions: Mix 1 capful (17 grams) in 4-8 oz of H2O and drink po daily  as needed for constipation Once A Day - PRN   aspirin 81 MG chewable tablet Chew by mouth daily.   calcium carbonate (OS-CAL) 600 MG TABS tablet Take 600 mg by mouth daily with breakfast.   Camphor-Menthol-Methyl Sal (SALONPAS) 3.02-19-08 % PTCH Place 1  patch onto the skin See admin instructions. Apply one patch transdermally to left aka stump daily as needed for pain   citalopram (CELEXA) 10 MG tablet Take 10 mg by mouth every morning.    Cranberry 450 MG TABS Take by mouth daily.   divalproex (DEPAKOTE SPRINKLE) 125 MG capsule Take 125 mg by mouth 3 (three) times daily.   Docusate Sodium 100 MG capsule Take 100 mg by mouth 2 (two) times daily.   guaiFENesin (ROBITUSSIN) 100 MG/5ML liquid Take 200 mg by mouth every 4 (four) hours as needed for cough or congestion.   insulin glargine (LANTUS SOLOSTAR) 100 UNIT/ML Solostar Pen Inject 6 Units into the skin daily. Hold for Blood Sugar less than 110- PRIME PEN WITH 2 UNITS PRIOR TO EACH USE-USE PEN WITHIN 28 DAYS   levothyroxine (SYNTHROID) 50 MCG tablet Take 50 mcg by mouth daily before breakfast.   lisinopril (ZESTRIL) 2.5 MG tablet Take 2.5 mg by mouth every morning.   loperamide (IMODIUM A-D) 2 MG tablet Take 2 mg by mouth every 8 (eight) hours as needed for diarrhea or loose stools.   LORazepam (ATIVAN) 1 MG tablet Take 1 mg by mouth in the morning, at noon, in the evening, and at bedtime.   melatonin 3 MG TABS tablet Take 3 mg by mouth at bedtime.   metFORMIN (GLUCOPHAGE) 1000 MG tablet Take 1,000 mg by mouth 2 (two) times daily.   Multiple Vitamins-Minerals (HEALTHY EYES/LUTEIN-ZEAXANTHIN) CAPS Take 1 capsule by mouth every morning.   NYAMYC powder Apply 1 application topically 2 (two) times daily as needed (groin rash).   polyethylene glycol (MIRALAX / GLYCOLAX) 17 g packet Take 17 g by mouth daily as needed.   QUEtiapine (SEROQUEL) 50 MG tablet Take 50 mg by mouth 2 (two) times daily.   simvastatin (ZOCOR) 20 MG tablet Take 20 mg by mouth at bedtime.   sodium chloride 1 g tablet Take 1 g by mouth 2 (two) times daily.   Sodium Fluoride (PREVIDENT 5000 BOOSTER PLUS) 1.1 % PSTE Place onto teeth daily. Brush on teeth with a toothbrush after PM mouth care. Spit out excess and do not rinse   thiamine 100 MG tablet Take 100 mg by mouth every morning. Vitamin B1   vitamin B-12 (CYANOCOBALAMIN) 500 MCG tablet Take 500 mcg by mouth daily.   zinc oxide 20 % ointment Apply 1 application  topically 3 (three) times daily as needed (apply to buttocks for redness).   [DISCONTINUED] LORazepam (ATIVAN) 1 MG tablet Take 1 tablet (1 mg total) by mouth 2 (two) times daily as needed for anxiety. (Patient taking differently: Take 1 mg by mouth in the morning, at noon, in the evening, and at bedtime.)   No facility-administered encounter medications on file as of 04/22/2021.    Review of Systems  Unable to perform ROS: Dementia   Immunization History  Administered Date(s) Administered   Influenza-Unspecified 11/11/2019, 11/11/2020   Moderna SARS-COV2 Booster Vaccination 02/02/2021   Moderna Sars-Covid-2 Vaccination 03/06/2019, 04/03/2019, 12/18/2019, 11/11/2020   Pertinent  Health Maintenance Due  Topic Date Due   OPHTHALMOLOGY EXAM  Never done   COLONOSCOPY (Pts 45-5582yrs Insurance coverage will need to be confirmed)  Never done   MAMMOGRAM  Never done   DEXA SCAN  Never done   HEMOGLOBIN A1C  06/07/2021   FOOT EXAM  03/09/2022   INFLUENZA VACCINE  Completed   Fall Risk 10/24/2020 10/25/2020 10/25/2020 10/26/2020 03/04/2021  Falls in the past year? - - - - 1  Was there an injury with Fall? - - - - 0  Fall Risk Category Calculator - - - - 1  Fall Risk Category - - - - Low  Patient Fall Risk Level High fall risk High fall risk High fall risk High fall risk Moderate fall risk  Patient at Risk for Falls Due to - - - - History of fall(s);Impaired balance/gait;Impaired mobility  Fall risk Follow up - - - - Falls evaluation completed;Education provided;Falls prevention discussed   Functional Status Survey:    Vitals:   04/22/21 1040  BP: 106/66  Pulse: 96  Resp: 16  Temp: 97.8 F (36.6 C)  SpO2: 95%  Weight: 164 lb 11.2 oz (74.7 kg)  Height: 5\' 7"  (1.702 m)   Body mass index is 25.8 kg/m. Physical Exam Vitals reviewed.  Constitutional:      General: She is not in acute distress. HENT:     Head: Normocephalic.     Right Ear: There is no impacted cerumen.     Left Ear:  There is no impacted cerumen.     Nose: Nose normal.     Mouth/Throat:     Mouth: Mucous membranes are moist.  Eyes:     General:        Right eye: No discharge.        Left eye: No discharge.  Neck:     Vascular: No carotid bruit.  Cardiovascular:     Rate and Rhythm: Normal rate and regular rhythm.     Pulses: Normal pulses.     Heart sounds: Normal heart sounds. No murmur heard. Pulmonary:     Effort: Pulmonary effort is normal. No respiratory distress.     Breath sounds: Normal breath sounds. No wheezing.  Abdominal:     General: Bowel sounds are normal. There is no distension.     Palpations: Abdomen is soft.     Tenderness: There is no abdominal tenderness.  Musculoskeletal:     Cervical back: Neck supple.     Comments: Left AKA  Lymphadenopathy:     Cervical: No cervical adenopathy.  Skin:    General: Skin is warm and dry.     Capillary Refill: Capillary refill takes less than 2 seconds.  Neurological:     General: No focal deficit present.     Mental Status: She is alert. Mental status is at baseline.     Motor: Weakness present.     Gait: Gait abnormal.     Comments: wheelchair  Psychiatric:        Mood and Affect: Mood normal.        Behavior: Behavior normal.        Cognition and Memory: Memory is impaired.     Comments: Follows commands, alert to self    Labs reviewed: Recent Labs    10/23/20 1813 10/24/20 0232 10/25/20 0453 12/07/20 0000 04/21/21 0000  NA 145 140 134* 136* 139  K 4.3 3.9 4.3 4.7 4.7  CL 111 106 104 104 106  CO2 24 25 21* 24* 21  GLUCOSE 123* 131* 109*  --   --   BUN 28* 26* 18 15 30*  CREATININE 0.62 0.70 0.67 0.7 0.7  CALCIUM 10.3 9.6 8.4* 9.7 10.0  MG  --  1.6* 1.9  --   --    Recent Labs    10/23/20 1813 10/24/20 0232 12/07/20 0000 04/21/21 0000  AST 17 13* 12* 14  ALT 16 16 8 11   ALKPHOS 55 56  51 49  BILITOT 0.7 0.6  --   --   PROT 7.0 6.7  --   --   ALBUMIN 3.1* 3.0* 3.5 4.0   Recent Labs    10/23/20 1813  10/24/20 0232 10/25/20 0453 12/07/20 0000 04/21/21 0000  WBC 12.5* 12.1* 10.0 10.0 8.1  NEUTROABS  --  9.3*  --   --  5,605.00  HGB 10.2* 10.7* 10.6* 11.0* 13.5  HCT 32.2* 33.4* 32.7* 35* 41  MCV 86.3 86.5 84.7  --   --   PLT 308 319 312 353 282   Lab Results  Component Value Date   TSH 2.52 12/07/2020   Lab Results  Component Value Date   HGBA1C 6.1 12/07/2020   Lab Results  Component Value Date   CHOL 120 12/07/2020   HDL 47 12/07/2020   LDLCALC 50 12/07/2020   TRIG 148 12/07/2020    Significant Diagnostic Results in last 30 days:  No results found.  Assessment/Plan 1. Do not resuscitate  2. Type 2 diabetes mellitus with peripheral vascular disease (HCC) - A1c stable, sugars 10-150's, no hypoglycemia - cont metformin, Lantus,asa, ace, statin  3. Frontotemporal dementia with behavioral disturbance (HCC) - behavioral tics improved since starting Depakote - cont Depakote, Seroquel, and Ativan  4. Recurrent UTI - no recent episodes - cont cranberry supplement  5. Essential hypertension - controlled  - cont lisinopril  6. Mixed diabetic hyperlipidemia associated with type 2 diabetes mellitus (HCC) - LDL at goal < 70 - cont statin  7. Acquired hypothyroidism - TSH stable - cont levothyroxine  8. Hyponatremia - Na stable - cont sodium tablets  9. Depression, recurrent (HCC) - no mood changes - cont Celexa  10. S/P AKA (above knee amputation), left (HCC) - cont skilled nursing care     Family/ staff Communication: plan discussed with patient and nurse  Labs/tests ordered:  none

## 2021-04-22 NOTE — Telephone Encounter (Signed)
SNF- [facility] Resident/Patient ? ?Incoming refill request received form [facility] requesting a refill for  ? ?Lorazepam 1 mg tablet ?Take 1 tablet by mouth four times a day scheduled ? ?Quantify Request is #[id number]  ? ?Side Note: According to [facility] documentation on 04/19/2021 (see progress note/assessment and plan) - will increase Lorazepam 1mg  tid as needed ? ?This differs from the current medication list and the request from [facility] ? ?RX request will be sent to [facility], NP to further review and advise  ?

## 2021-04-22 NOTE — Progress Notes (Signed)
Location:   [facility] Room Number: 29 Place of Service:  SNF [phone]) Provider:  Einar Crow MD   Mahlon Gammon, MD  Patient Care Team: Mahlon Gammon, MD as PCP - General (Internal Medicine)  Extended Emergency Contact Information Primary Emergency Contact: [facility] Phone: [phone] Relation: Sister Secondary Emergency Contact: [facility] Phone: [phone] Mobile Phone: [phone] Relation: Brother  Code Status:  DNR Managed Care Goals of care: Advanced Directive information Advanced Directives 04/22/2021  Does Patient Have a Medical Advance Directive? Yes  Type of Estate agent of [place];Living will;Out of facility DNR (pink MOST or yellow form)  Does patient want to make changes to medical advance directive? No - Patient declined  Copy of Healthcare Power of Attorney in Chart? Yes - validated most recent copy scanned in chart (See row information)  Pre-existing out of facility DNR order (yellow form or pink MOST form) Yellow form placed in chart (order not valid for inpatient use)     Chief Complaint  Patient presents with   Acute Visit    HPI:  Pt is a 71 y.o. female seen today for an acute visit for Continue behavior issues  Patient is in SNF for Therapy and Long term care   Patient was diagnosed with frontotemporal dementia in 2019.  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 She is retired Engineer, civil (consulting) Her sister is the [employer] to have issues with her behavior with recurrent Fake sneezing and Repitive noises  When I wen to there room she stops for few min and then starts again Loudly and if we close the door she bangs on her  Table. Doe shave aphasia but responds with few words Does not follow any commands Past Medical History:  Diagnosis Date   Dementia (HCC)    Diabetes mellitus without complication (HCC)    Hx of BKA, left (HCC) 10/24/2020   Thyroid disease    History reviewed. No pertinent surgical history.  No Known Allergies  Allergies as of 04/21/2021   No Known Allergies      Medication List        Accurate as of April 21, 2021 11:59 PM. If you have any questions, ask your nurse or doctor.          acetaminophen 500 MG tablet Commonly known as: TYLENOL Take 1,000 mg by mouth 2 (two) times daily as needed for fever (pain).   AMBULATORY NON FORMULARY MEDICATION peg3350-sod sul-NaCl-KCl-asb-C  powder in packet; 100-7.5-2.691 gram; amt: 1 Capful (17 grams); oral  Special Instructions: Mix 1 capful (17 grams) in 4-8 oz of H2O and drink po daily  as needed for constipation Once A Day - PRN   aspirin 81 MG chewable tablet Chew by mouth daily.   calcium carbonate 600 MG Tabs tablet Commonly known as: OS-CAL Take 600 mg by mouth daily with breakfast.   citalopram 10 MG tablet Commonly known as: CELEXA Take 10 mg by mouth every morning.   Cranberry 450 MG Tabs Take by mouth daily.   divalproex 125 MG capsule Commonly known as: DEPAKOTE SPRINKLE Take 125 mg by mouth 3 (three) times daily.   Docusate Sodium 100 MG capsule Take 100 mg by mouth 2 (two) times  daily.   guaiFENesin 100 MG/5ML liquid Commonly known as: ROBITUSSIN Take 200 mg by mouth every 4 (four) hours as needed for cough or congestion.   Healthy Eyes/Lutein-Zeaxanthin Caps Take 1 capsule by mouth every morning.   Lantus SoloStar 100 UNIT/ML Solostar Pen Generic drug: insulin glargine Inject 6 Units into the skin daily. Hold for Blood Sugar less than 110- PRIME PEN WITH 2 UNITS PRIOR TO EACH USE-USE PEN WITHIN 28 DAYS   levothyroxine 50 MCG tablet Commonly known as: SYNTHROID Take 50 mcg by mouth daily before  breakfast.   lisinopril 2.5 MG tablet Commonly known as: ZESTRIL Take 2.5 mg by mouth every morning.   loperamide 2 MG tablet Commonly known as: IMODIUM A-D Take 2 mg by mouth every 8 (eight) hours as needed for diarrhea or loose stools.   LORazepam 1 MG tablet Commonly known as: ATIVAN Take 1 mg by mouth in the morning, at noon, in the evening, and at bedtime.   LORazepam 1 MG tablet Commonly known as: ATIVAN Take 1 tablet (1 mg total) by mouth 2 (two) times daily as needed for anxiety.   melatonin 3 MG Tabs tablet Take 3 mg by mouth at bedtime.   metFORMIN 1000 MG tablet Commonly known as: GLUCOPHAGE Take 1,000 mg by mouth 2 (two) times daily.   Nyamyc powder Generic drug: nystatin Apply 1 application topically 2 (two) times daily as needed (groin rash).   polyethylene glycol 17 g packet Commonly known as: MIRALAX / GLYCOLAX Take 17 g by mouth daily as needed.   PreviDent 5000 Booster Plus 1.1 % Pste Generic drug: Sodium Fluoride Place onto teeth daily. Brush on teeth with a toothbrush after PM mouth care. Spit out excess and do not rinse   QUEtiapine 50 MG tablet Commonly known as: SEROQUEL Take 50 mg by mouth 2 (two) times daily.   Salonpas 3.02-19-08 % Ptch Generic drug: Camphor-Menthol-Methyl Sal Place 1 patch onto the skin See admin instructions. Apply one patch transdermally to left aka stump daily as needed for pain   simvastatin 20 MG tablet Commonly known as: ZOCOR Take 20 mg by mouth at bedtime.   sodium chloride 1 g tablet Take 1 g by mouth 2 (two) times daily.   thiamine 100 MG tablet Take 100 mg by mouth every morning. Vitamin B1   vitamin B-12 500 MCG tablet Commonly known as: CYANOCOBALAMIN Take 500 mcg by mouth daily.   zinc oxide 20 % ointment Apply 1 application topically 3 (three) times daily as needed (apply to buttocks for redness).        Review of Systems  Unable to perform ROS: Dementia   Immunization History  Administered  Date(s) Administered   Influenza-Unspecified 11/11/2019, 11/11/2020   Moderna SARS-COV2 Booster Vaccination 02/02/2021   Moderna Sars-Covid-2 Vaccination 03/06/2019, 04/03/2019, 12/18/2019, 11/11/2020   Pertinent  Health Maintenance Due  Topic Date Due   OPHTHALMOLOGY EXAM  Never done   COLONOSCOPY (Pts 45-2352yrs Insurance coverage will need to be confirmed)  Never done   MAMMOGRAM  Never done   DEXA SCAN  Never done   HEMOGLOBIN A1C  06/07/2021   FOOT EXAM  03/09/2022   INFLUENZA VACCINE  Completed   Fall Risk 10/24/2020 10/25/2020 10/25/2020 10/26/2020 03/04/2021  Falls in the past year? - - - - 1  Was there an injury with Fall? - - - - 0  Fall Risk Category Calculator - - - - 1  Fall Risk Category - - - - Low  Patient  Fall Risk Level High fall risk High fall risk High fall risk High fall risk Moderate fall risk  Patient at Risk for Falls Due to - - - - History of fall(s);Impaired balance/gait;Impaired mobility  Fall risk Follow up - - - - Falls evaluation completed;Education provided;Falls prevention discussed   Functional Status Survey:    Vitals:   04/22/21 1142  BP: 106/66  Pulse: 96  Resp: 16  Temp: 97.8 F (36.6 C)  SpO2: 95%  Weight: 164 lb 11.2 oz (74.7 kg)  Height:  (1.702 m)   Body mass index is 25.8 kg/m. Physical Exam Vitals reviewed.  Constitutional:      Appearance: Normal appearance.  HENT:     Head: Normocephalic.     Nose: Nose normal.     Mouth/Throat:     Mouth: Mucous membranes are moist.     Pharynx: Oropharynx is clear.  Eyes:     Pupils: Pupils are equal, round, and reactive to light.  Cardiovascular:     Rate and Rhythm: Normal rate and regular rhythm.     Pulses: Normal pulses.     Heart sounds: Normal heart sounds. No murmur heard. Pulmonary:     Effort: Pulmonary effort is normal.     Breath sounds: Normal breath sounds.  Abdominal:     General: Abdomen is flat. Bowel sounds are normal.     Palpations: Abdomen is soft.   Musculoskeletal:        General: No swelling.     Cervical back: Neck supple.  Skin:    General: Skin is warm.  Neurological:     General: No focal deficit present.     Mental Status: She is alert.  Psychiatric:        Mood and Affect: Mood normal.        Thought Content: Thought content normal.     Comments: Alert Makes repetitive noises      Labs reviewed: Recent Labs    10/23/20 1813 10/24/20 0232 10/25/20 0453 12/07/20 0000 04/21/21 0000  NA 145 140 134* 136* 139  K 4.3 3.9 4.3 4.7 4.7  CL 111 106 104 104 106  CO2 24 25 21* 24* 21  GLUCOSE 123* 131* 109*  --   --   BUN 28* 26* 18 15 30*  CREATININE 0.62 0.70 0.67 0.7 0.7  CALCIUM 10.3 9.6 8.4* 9.7 10.0  MG  --  1.6* 1.9  --   --    Recent Labs    10/23/20 1813 10/24/20 0232 12/07/20 0000 04/21/21 0000  AST 17 13* 12* 14  ALT ALKPHOS 55 56 51 49  BILITOT 0.7 0.6  --   --   PROT 7.0 6.7  --   --   ALBUMIN 3.1* 3.0* 3.5 4.0   Recent Labs    10/23/20 1813 10/24/20 0232 10/25/20 0453 12/07/20 0000 04/21/21 0000  WBC 12.5* 12.1* 10.0 10.0 8.1  NEUTROABS  --  9.3*  --   --  5,605.00  HGB 10.2* 10.7* 10.6* 11.0* 13.5  HCT 32.2* 33.4* 32.7* 35* 41  MCV 86.3 86.5 84.7  --   --   PLT 308 319 312 353 282   Lab Results  Component Value Date   TSH 2.52 12/07/2020   Lab Results  Component Value Date   HGBA1C 6.1 12/07/2020   Lab Results  Component Value Date   CHOL 120 12/07/2020   HDL 47 12/07/2020   LDLCALC 50 12/07/2020  TRIG 148 12/07/2020    Significant Diagnostic Results in last 30 days:  No results found.  Assessment/Plan  1. Frontotemporal dementia with behavioral disturbance (HCC) Will start her on Depakote 125 mg TID And schedule her Ativan  I salso on Seroquel Will try to taper that once she is more stable  2 Encephalopathy Last MRI I can find in 2019 which showed Remote infarcts seen in the right anterior temporal lobe and right periventricular basal ganglia.   Markedly severe cerebral atrophy most significant temporal parietal lobes. Clinically correlate for Alzheimer's   Sister does not want more Work up Baby aspirin Already on statin   3. Type 2 diabetes mellitus with peripheral vascular disease (HCC) A1C good in 10/22 On Lantus and Metformin   4. Hyperlipidemia associated with type 2 diabetes mellitus (HCC) On Statin Last LDL 50 in 10/22   5. Acquired hypothyroidism TSH normal in 10/22   6. Hyponatremia On Sodium tabs H/o Psychogenic Polydipsia   7. Depression, recurrent (HCC) On Celexa  Family/ staff Communication:   Labs/tests ordered:

## 2021-04-22 NOTE — Telephone Encounter (Signed)
I signed medication refill notice this morning. I will also send electronically. Thank you

## 2021-04-25 ENCOUNTER — Telehealth: Payer: Self-pay | Admitting: Orthopedic Surgery

## 2021-04-25 NOTE — Telephone Encounter (Signed)
Care gaps discussed with sister/HPOA. She does not know past vaccination record. She plans to call previous provider and obtain records.  ?

## 2021-04-26 DIAGNOSIS — R2681 Unsteadiness on feet: Secondary | ICD-10-CM | POA: Diagnosis not present

## 2021-04-26 DIAGNOSIS — Z9181 History of falling: Secondary | ICD-10-CM | POA: Diagnosis not present

## 2021-04-26 DIAGNOSIS — M6281 Muscle weakness (generalized): Secondary | ICD-10-CM | POA: Diagnosis not present

## 2021-04-28 DIAGNOSIS — Z9181 History of falling: Secondary | ICD-10-CM | POA: Diagnosis not present

## 2021-04-28 DIAGNOSIS — M6281 Muscle weakness (generalized): Secondary | ICD-10-CM | POA: Diagnosis not present

## 2021-04-28 DIAGNOSIS — R2681 Unsteadiness on feet: Secondary | ICD-10-CM | POA: Diagnosis not present

## 2021-05-03 DIAGNOSIS — M6281 Muscle weakness (generalized): Secondary | ICD-10-CM | POA: Diagnosis not present

## 2021-05-03 DIAGNOSIS — R2681 Unsteadiness on feet: Secondary | ICD-10-CM | POA: Diagnosis not present

## 2021-05-03 DIAGNOSIS — Z9181 History of falling: Secondary | ICD-10-CM | POA: Diagnosis not present

## 2021-05-05 ENCOUNTER — Non-Acute Institutional Stay (SKILLED_NURSING_FACILITY): Payer: Medicare Other | Admitting: Internal Medicine

## 2021-05-05 ENCOUNTER — Encounter: Payer: Self-pay | Admitting: Internal Medicine

## 2021-05-05 DIAGNOSIS — E1169 Type 2 diabetes mellitus with other specified complication: Secondary | ICD-10-CM | POA: Diagnosis not present

## 2021-05-05 DIAGNOSIS — E782 Mixed hyperlipidemia: Secondary | ICD-10-CM | POA: Diagnosis not present

## 2021-05-05 DIAGNOSIS — E1151 Type 2 diabetes mellitus with diabetic peripheral angiopathy without gangrene: Secondary | ICD-10-CM | POA: Diagnosis not present

## 2021-05-05 DIAGNOSIS — G3109 Other frontotemporal dementia: Secondary | ICD-10-CM | POA: Diagnosis not present

## 2021-05-05 DIAGNOSIS — I10 Essential (primary) hypertension: Secondary | ICD-10-CM | POA: Diagnosis not present

## 2021-05-05 DIAGNOSIS — E039 Hypothyroidism, unspecified: Secondary | ICD-10-CM

## 2021-05-05 DIAGNOSIS — F02818 Dementia in other diseases classified elsewhere, unspecified severity, with other behavioral disturbance: Secondary | ICD-10-CM

## 2021-05-05 DIAGNOSIS — Z9181 History of falling: Secondary | ICD-10-CM | POA: Diagnosis not present

## 2021-05-05 DIAGNOSIS — M6281 Muscle weakness (generalized): Secondary | ICD-10-CM | POA: Diagnosis not present

## 2021-05-05 DIAGNOSIS — F339 Major depressive disorder, recurrent, unspecified: Secondary | ICD-10-CM

## 2021-05-05 DIAGNOSIS — R2681 Unsteadiness on feet: Secondary | ICD-10-CM | POA: Diagnosis not present

## 2021-05-05 NOTE — Progress Notes (Signed)
?Location:   [facility] ?Nursing Home Room Number: 29 ?Place of Service:  SNF (31) ?Provider:  Einar Crow MD ? ?Mahlon Gammon, MD ? ?Patient Care Team: ?Mahlon Gammon, MD as PCP - General (Internal Medicine) ? ?Extended Emergency Contact Information ?Primary Emergency Contact: Dara Lords ?Mobile Phone: [phone] ?Relation: Sister ?Secondary Emergency Contact: Kennedy,Michael ?Home Phone: [phone] ?Mobile Phone: [phone] ?Relation: Brother ? ?Code Status:  DNR Managed Care ?Goals of care: Advanced Directive information ? ?  05/05/2021  ? 11:19 AM  ?Advanced Directives  ?Does Patient Have a Medical Advance Directive? Yes  ?Type of Estate agent of [place];Living will;Out of facility DNR (pink MOST or yellow form)  ?Does patient want to make changes to medical advance directive? No - Patient declined  ?Copy of Healthcare Power of Attorney in Chart? Yes - validated most recent copy scanned in chart (See row information)  ?Pre-existing out of facility DNR order (yellow form or pink MOST form) Yellow form placed in chart (order not valid for inpatient use)  ? ? ? ?Chief Complaint  ?Patient presents with  ? Acute Visit  ?  Drowzy   ? ? ?HPI:  ?Pt is a 71 y.o. female seen today for an acute visit for Drowziness ? ?Patient is in SNF for Therapy and Long term care ?  ?Patient was diagnosed with frontotemporal dementia in 2019.  ?Patient also has a history of recurrent UTI with ESBL ?History of hypothyroidism, hypertension, alcohol abuse, Warnicke Korsakoff syndrome ?She has left [facility] due to injury, ? diabetes mellitus type 2, HLD ?Patient husband killed himself years ago. 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 ?She is retired Engineer, civil (consulting) ?Her sister is the POA ?  ?Started on Depakote and Ativan for her constant  Screaming , fake sneezing and banging on her Tray. But she has been sleeping more. Needing help with her Feeding ?Though later day she was screaming  again ?Has aphasia and cant give any history ? ? ?Past Medical History:  ?Diagnosis Date  ? Dementia (HCC)   ? Diabetes mellitus without complication (HCC)   ? 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 Thyroid disease   ? ?History reviewed. No pertinent surgical history. ? ?No Known Allergies ? ?Allergies as of 05/05/2021   ?No Known Allergies ?  ? ?  ?Medication List  ?  ? ?  ? Accurate as of May 05, 2021 11:19 AM. If you have any questions, ask your nurse or doctor.  ?  ?  ? ?  ? ?acetaminophen 500 MG tablet ?Commonly known as: TYLENOL ?Take 1,000 mg by mouth 2 (two) times daily as needed for fever (pain). ?  ?AMBULATORY NON FORMULARY MEDICATION ?[id number]-sod sul-NaCl-KCl-[id number]-C  ?powder in packet; 100-7.5-2.691 gram; amt: 1 Capful (17 grams); oral  ?Special Instructions: Mix 1 capful (17 grams) in 4-8 oz of H2O and drink po daily  ?as needed for constipation ?Once A Day - PRN ?  ?aspirin 81 MG chewable tablet ?Chew by mouth daily. ?  ?calcium carbonate 600 MG Tabs tablet ?Commonly known as: OS-CAL ?Take 600 mg by mouth daily with breakfast. ?  ?citalopram 10 MG tablet ?Commonly known as: CELEXA ?Take 10 mg by mouth every morning. ?  ?Cranberry 450 MG Tabs ?Take by mouth daily. ?  ?divalproex 125 MG capsule ?Commonly known as: DEPAKOTE SPRINKLE ?Take 125 mg by mouth 3 (three) times daily. ?  ?Docusate Sodium 100 MG capsule ?Take 100 mg by mouth 2 (two) times  daily. ?  ?guaiFENesin 100 MG/5ML liquid ?Commonly known as: ROBITUSSIN ?Take 200 mg by mouth every 4 (four) hours as needed for cough or congestion. ?  ?Healthy Eyes/Lutein-Zeaxanthin Caps ?Take 1 capsule by mouth every morning. ?  ?Lantus SoloStar 100 UNIT/ML Solostar Pen ?Generic drug: insulin glargine ?Inject 6 Units into the skin daily. Hold for Blood Sugar less than 110- PRIME PEN WITH 2 UNITS PRIOR TO EACH USE-USE PEN WITHIN 28 DAYS ?  ?levothyroxine 50 MCG tablet ?Commonly known as: SYNTHROID ?Take 50 mcg by mouth daily before breakfast. ?  ?lisinopril 2.5 MG  tablet ?Commonly known as: ZESTRIL ?Take 2.5 mg by mouth every morning. ?  ?loperamide 2 MG tablet ?Commonly known as: IMODIUM A-D ?Take 2 mg by mouth every 8 (eight) hours as needed for diarrhea or loose stools. ?  ?LORazepam 1 MG tablet ?Commonly known as: ATIVAN ?Take 1 mg by mouth in the morning, at noon, in the evening, and at bedtime. ?  ?melatonin 3 MG Tabs tablet ?Take 3 mg by mouth at bedtime. ?  ?metFORMIN 1000 MG tablet ?Commonly known as: GLUCOPHAGE ?Take 1,000 mg by mouth 2 (two) times daily. ?  ?Nyamyc powder ?Generic drug: nystatin ?Apply 1 application topically 2 (two) times daily as needed (groin rash). ?  ?polyethylene glycol 17 g packet ?Commonly known as: MIRALAX / GLYCOLAX ?Take 17 g by mouth daily as needed. ?  ?PreviDent 5000 Booster Plus 1.1 % Pste ?Generic drug: Sodium Fluoride ?Place onto teeth daily. Brush on teeth with a toothbrush after PM mouth care. Spit out excess and do not rinse ?  ?QUEtiapine 50 MG tablet ?Commonly known as: SEROQUEL ?Take 50 mg by mouth 2 (two) times daily. ?  ?Salonpas 3.02-19-08 % Ptch ?Generic drug: Camphor-Menthol-Methyl Sal ?Place 1 patch onto the skin See admin instructions. Apply one patch transdermally to left aka stump daily as needed for pain ?  ?simvastatin 20 MG tablet ?Commonly known as: ZOCOR ?Take 20 mg by mouth at bedtime. ?  ?sodium chloride 1 g tablet ?Take 1 g by mouth 2 (two) times daily. ?  ?thiamine 100 MG tablet ?Take 100 mg by mouth every morning. Vitamin B1 ?  ?vitamin B-12 500 MCG tablet ?Commonly known as: CYANOCOBALAMIN ?Take 500 mcg by mouth daily. ?  ?zinc oxide 20 % ointment ?Apply 1 application topically 3 (three) times daily as needed (apply to buttocks for redness). ?  ? ?  ? ? ?Review of Systems  ?Unable to perform ROS: Dementia  ? ?Immunization History  ?Administered Date(s) Administered  ? Influenza-Unspecified 11/11/2019, 11/11/2020  ? Moderna SARS-COV2 Booster Vaccination 02/02/2021  ? Moderna Sars-Covid-2 Vaccination  03/06/2019, 04/03/2019, 12/18/2019, 11/11/2020  ? Pneumococcal Conjugate-13 12/09/2016  ? Pneumococcal Polysaccharide-23 12/09/2013  ? Tdap 03/12/2012  ? Zoster, Live 11/26/2012  ? ?Pertinent  Health Maintenance Due  ?Topic Date Due  ? OPHTHALMOLOGY EXAM  Never done  ? HEMOGLOBIN A1C  06/07/2021  ? FOOT EXAM  03/09/2022  ? INFLUENZA VACCINE  Completed  ? MAMMOGRAM  Discontinued  ? DEXA SCAN  Discontinued  ? COLONOSCOPY (Pts 45-567yrs Insurance coverage will need to be confirmed)  Discontinued  ? ? ?  10/24/2020  ?  8:00 PM 10/25/2020  ? 11:00 AM 10/25/2020  ?  7:10 PM 10/26/2020  ? 12:00 PM 03/04/2021  ?  2:48 PM  ?Fall Risk  ?Falls in the past year?     1  ?Was there an injury with Fall?     0  ?Fall Risk Category  Calculator     1  ?Fall Risk Category     Low  ?Patient Fall Risk Level High fall risk High fall risk High fall risk High fall risk Moderate fall risk  ?Patient at Risk for Falls Due to     History of fall(s);Impaired balance/gait;Impaired mobility  ?Fall risk Follow up     Falls evaluation completed;Education provided;Falls prevention discussed  ? ?Functional Status Survey: ?  ? ?Vitals:  ? 05/05/21 1108  ?BP: (!) 104/54  ?Pulse: 82  ?Resp: 18  ?Temp: (!) 97.4 ?F (36.3 ?C)  ?SpO2: 95%  ?Weight: 164 lb 11.2 oz (74.7 kg)  ?Height: 5\' 7"  (1.702 m)  ? ?Body mass index is 25.8 kg/m? ?Physical Exam ?Vitals reviewed.  ?Constitutional:   ?   Appearance: Normal appearance.  ?   Comments: Was more sleepy  ?HENT:  ?   Head: Normocephalic.  ?   Nose: Nose normal.  ?   Mouth/Throat:  ?   Mouth: Mucous membranes are moist.  ?   Pharynx: Oropharynx is clear.  ?Eyes:  ?   Pupils: Pupils are equal, round, and reactive to light.  ?Cardiovascular:  ?   Rate and Rhythm: Normal rate and regular rhythm.  ?   Pulses: Normal pulses.  ?   Heart sounds: Normal heart sounds. No murmur heard. ?Pulmonary:  ?   Effort: Pulmonary effort is normal.  ?   Breath sounds: Normal breath sounds.  ?Abdominal:  ?   General: Abdomen is flat. Bowel  sounds are normal.  ?   Palpations: Abdomen is soft.  ?Musculoskeletal:     ?   General: No swelling.  ?   Cervical back: Neck supple.  ?Skin: ?   General: Skin is warm.  ?Neurological:  ?   General: No focal de

## 2021-05-06 ENCOUNTER — Other Ambulatory Visit: Payer: Self-pay

## 2021-05-06 MED ORDER — LORAZEPAM 1 MG PO TABS: 0.5000 mg | ORAL_TABLET | Freq: Four times a day (QID) | ORAL | 0 refills | Status: DC | PRN

## 2021-05-06 NOTE — Telephone Encounter (Signed)
Refill request received from [facility] for lorazepam 0.5mg  tablet one every 6 hours as needed. Medication pended and sent to Windell Moulding, NP for approval. ?

## 2021-05-10 DIAGNOSIS — R2681 Unsteadiness on feet: Secondary | ICD-10-CM | POA: Diagnosis not present

## 2021-05-10 DIAGNOSIS — Z9181 History of falling: Secondary | ICD-10-CM | POA: Diagnosis not present

## 2021-05-10 DIAGNOSIS — M6281 Muscle weakness (generalized): Secondary | ICD-10-CM | POA: Diagnosis not present

## 2021-05-11 DIAGNOSIS — M6281 Muscle weakness (generalized): Secondary | ICD-10-CM | POA: Diagnosis not present

## 2021-05-11 DIAGNOSIS — R2681 Unsteadiness on feet: Secondary | ICD-10-CM | POA: Diagnosis not present

## 2021-05-11 DIAGNOSIS — Z9181 History of falling: Secondary | ICD-10-CM | POA: Diagnosis not present

## 2021-05-17 DIAGNOSIS — R2681 Unsteadiness on feet: Secondary | ICD-10-CM | POA: Diagnosis not present

## 2021-05-17 DIAGNOSIS — Z9181 History of falling: Secondary | ICD-10-CM | POA: Diagnosis not present

## 2021-05-17 DIAGNOSIS — M6281 Muscle weakness (generalized): Secondary | ICD-10-CM | POA: Diagnosis not present

## 2021-05-19 ENCOUNTER — Non-Acute Institutional Stay (SKILLED_NURSING_FACILITY): Payer: Medicare Other | Admitting: Internal Medicine

## 2021-05-19 DIAGNOSIS — E782 Mixed hyperlipidemia: Secondary | ICD-10-CM | POA: Diagnosis not present

## 2021-05-19 DIAGNOSIS — F02818 Dementia in other diseases classified elsewhere, unspecified severity, with other behavioral disturbance: Secondary | ICD-10-CM | POA: Diagnosis not present

## 2021-05-19 DIAGNOSIS — R2681 Unsteadiness on feet: Secondary | ICD-10-CM | POA: Diagnosis not present

## 2021-05-19 DIAGNOSIS — Z9181 History of falling: Secondary | ICD-10-CM | POA: Diagnosis not present

## 2021-05-19 DIAGNOSIS — G3109 Other frontotemporal dementia: Secondary | ICD-10-CM

## 2021-05-19 DIAGNOSIS — E1169 Type 2 diabetes mellitus with other specified complication: Secondary | ICD-10-CM

## 2021-05-19 DIAGNOSIS — E1151 Type 2 diabetes mellitus with diabetic peripheral angiopathy without gangrene: Secondary | ICD-10-CM | POA: Diagnosis not present

## 2021-05-19 DIAGNOSIS — I10 Essential (primary) hypertension: Secondary | ICD-10-CM | POA: Diagnosis not present

## 2021-05-19 DIAGNOSIS — F339 Major depressive disorder, recurrent, unspecified: Secondary | ICD-10-CM

## 2021-05-19 DIAGNOSIS — E039 Hypothyroidism, unspecified: Secondary | ICD-10-CM

## 2021-05-19 DIAGNOSIS — M6281 Muscle weakness (generalized): Secondary | ICD-10-CM | POA: Diagnosis not present

## 2021-05-19 NOTE — Progress Notes (Signed)
? ?Location: [facility] ?  ?Place of Service:  SNF (31) ? ?Provider:  ? ?Code Status: DNR ?Goals of Care:  ? ?  05/05/2021  ? 11:19 AM  ?Advanced Directives  ?Does Patient Have a Medical Advance Directive? Yes  ?Type of Estate agentAdvance Directive Healthcare Power of [place];Living will;Out of facility DNR (pink MOST or yellow form)  ?Does patient want to make changes to medical advance directive? No - Patient declined  ?Copy of Healthcare Power of Attorney in Chart? Yes - validated most recent copy scanned in chart (See row information)  ?Pre-existing out of facility DNR order (yellow form or pink MOST form) Yellow form placed in chart (order not valid for inpatient use)  ? ? ? ?Chief Complaint  ?Patient presents with  ? Acute Visit  ? ? ?HPI: Patient is a 71 y.o. female seen today for an acute visit for Behaviors ? ?Patient was diagnosed with frontotemporal dementia in 2019.  ?Patient also has a history of recurrent UTI with ESBL ?History of hypothyroidism, hypertension, alcohol abuse, Warnicke Korsakoff syndrome ?She has left [facility] due to injury, ? diabetes mellitus type 2, HLD ?Patient husband killed himself years ago. 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 ?She is retired Engineer, civil (consulting)urse ?Her sister is the POA ? ?Today patient has been constant ly screaming ?Fake Sneezing banging ?Already got one dose of ativan ?Has Felizardo Hoffmannaphsia Cant give history no fever or coughing  ? ? ?Past Medical History:  ?Diagnosis Date  ? Dementia (HCC)   ? Diabetes mellitus without complication (HCC)   ? 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 Thyroid disease   ? ? ?No past surgical history on file. ? ?No Known Allergies ? ?Outpatient Encounter Medications as of 05/19/2021  ?Medication Sig  ? acetaminophen (TYLENOL) 500 MG tablet Take 1,000 mg by mouth 2 (two) times daily as needed for fever (pain).  ? AMBULATORY NON FORMULARY MEDICATION peg3350-sod sul-NaCl-KCl-asb-C  ?powder in packet; 100-7.5-2.691 gram; amt: 1 Capful (17 grams); oral   ?Special Instructions: Mix 1 capful (17 grams) in 4-8 oz of H2O and drink po daily  ?as needed for constipation ?Once A Day - PRN  ? aspirin 81 MG chewable tablet Chew by mouth daily.  ? calcium carbonate (OS-CAL) 600 MG TABS tablet Take 600 mg by mouth daily with breakfast.  ? Camphor-Menthol-Methyl Sal (SALONPAS) 3.02-19-08 % PTCH Place 1 patch onto the skin See admin instructions. Apply one patch transdermally to left aka stump daily as needed for pain  ? citalopram (CELEXA) 10 MG tablet Take 10 mg by mouth every morning.  ? Cranberry 450 MG TABS Take by mouth daily.  ? divalproex (DEPAKOTE SPRINKLE) 125 MG capsule Take 125 mg by mouth 3 (three) times daily.  ? Docusate Sodium 100 MG capsule Take 100 mg by mouth 2 (two) times daily.  ? guaiFENesin (ROBITUSSIN) 100 MG/5ML liquid Take 200 mg by mouth every 4 (four) hours as needed for cough or congestion.  ? insulin glargine (LANTUS SOLOSTAR) 100 UNIT/ML Solostar Pen Inject 6 Units into the skin daily. Hold for Blood Sugar less than 110- PRIME PEN WITH 2 UNITS PRIOR TO EACH USE-USE PEN WITHIN 28 DAYS  ? levothyroxine (SYNTHROID) 50 MCG tablet Take 50 mcg by mouth daily before breakfast.  ? lisinopril (ZESTRIL) 2.5 MG tablet Take 2.5 mg by mouth every morning.  ? loperamide (IMODIUM A-D) 2 MG tablet Take 2 mg by mouth every 8 (eight) hours as needed for diarrhea or loose  stools.  ? LORazepam (ATIVAN) 1 MG tablet Take 0.5 tablets (0.5 mg total) by mouth every 6 (six) hours as needed.  ? melatonin 3 MG TABS tablet Take 3 mg by mouth at bedtime.  ? metFORMIN (GLUCOPHAGE) 1000 MG tablet Take 1,000 mg by mouth 2 (two) times daily.  ? Multiple Vitamins-Minerals (HEALTHY EYES/LUTEIN-ZEAXANTHIN) CAPS Take 1 capsule by mouth every morning.  ? NYAMYC powder Apply 1 application topically 2 (two) times daily as needed (groin rash).  ? polyethylene glycol (MIRALAX / GLYCOLAX) 17 g packet Take 17 g by mouth daily as needed.  ? QUEtiapine (SEROQUEL) 50 MG tablet Take by mouth 2  (two) times daily. 25 mg in the am ?50 mg in pm  ? simvastatin (ZOCOR) 20 MG tablet Take 20 mg by mouth at bedtime.  ? sodium chloride 1 g tablet Take 1 g by mouth 2 (two) times daily.  ? Sodium Fluoride (PREVIDENT 5000 BOOSTER PLUS) 1.1 % PSTE Place onto teeth daily. Brush on teeth with a toothbrush after PM mouth care. Spit out excess and do not rinse  ? thiamine 100 MG tablet Take 100 mg by mouth every morning. Vitamin B1  ? vitamin B-12 (CYANOCOBALAMIN) 500 MCG tablet Take 500 mcg by mouth daily.  ? zinc oxide 20 % ointment Apply 1 application topically 3 (three) times daily as needed (apply to buttocks for redness).  ? ?No facility-administered encounter medications on file as of 05/19/2021.  ? ? ?Review of Systems:  ?Review of Systems  ?Unable to perform ROS: Dementia  ? ?Health Maintenance  ?Topic Date Due  ? OPHTHALMOLOGY EXAM  Never done  ? Hepatitis C Screening  Never done  ? Zoster Vaccines- Shingrix (1 of 2) Never done  ? Pneumonia Vaccine 57+ Years old (3) 12/10/2018  ? COVID-19 Vaccine (5 - Booster for Moderna series) 07/07/2021 (Originally 03/30/2021)  ? HEMOGLOBIN A1C  06/07/2021  ? INFLUENZA VACCINE  09/13/2021  ? FOOT EXAM  03/09/2022  ? TETANUS/TDAP  03/12/2022  ? HPV VACCINES  Aged Out  ? MAMMOGRAM  Discontinued  ? DEXA SCAN  Discontinued  ? COLONOSCOPY (Pts 45-58yrs Insurance coverage will need to be confirmed)  Discontinued  ? ? ?Physical Exam: ?Vitals:  ? 05/22/21 1433  ?BP: 126/67  ?Pulse: 62  ?Resp: 20  ?Temp: (!) 97.5 ?F (36.4 ?C)  ?Weight: 160 lb 8 oz (72.8 kg)  ? ?Body mass index is 25.14 kg/m?Marland Kitchen ?Physical Exam ?Vitals reviewed.  ?Constitutional:   ?   Appearance: Normal appearance.  ?HENT:  ?   Head: Normocephalic.  ?   Nose: Nose normal.  ?   Mouth/Throat:  ?   Mouth: Mucous membranes are moist.  ?   Pharynx: Oropharynx is clear.  ?Eyes:  ?   Pupils: Pupils are equal, round, and reactive to light.  ?Cardiovascular:  ?   Rate and Rhythm: Normal rate and regular rhythm.  ?   Pulses: Normal  pulses.  ?   Heart sounds: Normal heart sounds. No murmur heard. ?Pulmonary:  ?   Effort: Pulmonary effort is normal.  ?   Breath sounds: Normal breath sounds.  ?Abdominal:  ?   General: Abdomen is flat. Bowel sounds are normal.  ?   Palpations: Abdomen is soft.  ?Musculoskeletal:     ?   General: No swelling.  ?   Cervical back: Neck supple.  ?   Comments: S/P AKA  ?Skin: ?   General: Skin is warm.  ?Neurological:  ?   General:  No focal deficit present.  ?   Mental Status: She is alert.  ?Psychiatric:     ?   Mood and Affect: Mood normal.     ?   Thought Content: Thought content normal.  ? ? ?Labs reviewed: ?Basic Metabolic Panel: ?Recent Labs  ?  10/23/20 ?1813 10/24/20 ?0040 10/24/20 ?0232 10/25/20 ?0453 12/07/20 ?0000 04/21/21 ?0000  ?NA 145  --  140 134* 136* 139  ?K 4.3  --  3.9 4.3 4.7 4.7  ?CL 111  --  106 104 104 106  ?CO2 24  --  25 21* 24* 21  ?GLUCOSE 123*  --  131* 109*  --   --   ?BUN 28*  --  26* 18 15 30*  ?CREATININE 0.62  --  0.70 0.67 0.7 0.7  ?CALCIUM 10.3  --  9.6 8.4* 9.7 10.0  ?MG  --   --  1.6* 1.9  --   --   ?TSH  --  0.936  --   --  2.52  --   ? ?Liver Function Tests: ?Recent Labs  ?  10/23/20 ?1813 10/24/20 ?0232 12/07/20 ?0000 04/21/21 ?0000  ?AST 17 13* 12* 14  ?ALT 16 16 8 11   ?ALKPHOS 55 56 51 49  ?BILITOT 0.7 0.6  --   --   ?PROT 7.0 6.7  --   --   ?ALBUMIN 3.1* 3.0* 3.5 4.0  ? ?No results for input(s): LIPASE, AMYLASE in the last 8760 hours. ?Recent Labs  ?  10/24/20 ?0040  ?AMMONIA 21  ? ?CBC: ?Recent Labs  ?  10/23/20 ?1813 10/24/20 ?0232 10/25/20 ?0453 12/07/20 ?0000 04/21/21 ?0000  ?WBC 12.5* 12.1* 10.0 10.0 8.1  ?NEUTROABS  --  9.3*  --   --  5,605.00  ?HGB 10.2* 10.7* 10.6* 11.0* 13.5  ?HCT 32.2* 33.4* 32.7* 35* 41  ?MCV 86.3 86.5 84.7  --   --   ?PLT 308 319 312 353 282  ? ?Lipid Panel: ?Recent Labs  ?  12/07/20 ?0000  ?CHOL 120  ?HDL 47  ?LDLCALC 50  ?TRIG 148  ? ?Lab Results  ?Component Value Date  ? HGBA1C 6.1 12/07/2020  ? ? ?Procedures since last visit: ?No results  found. ? ?Assessment/Plan ?Frontotemporal dementia with behavioral disturbance (HCC) ?Change afternoon Depakote to 250 mg  ?Can have extra ativan today ? ?Other issues ? ?Encephalopathy ?Last MRI I can find in 2

## 2021-05-22 ENCOUNTER — Encounter: Payer: Self-pay | Admitting: Internal Medicine

## 2021-05-24 DIAGNOSIS — R2681 Unsteadiness on feet: Secondary | ICD-10-CM | POA: Diagnosis not present

## 2021-05-24 DIAGNOSIS — Z9181 History of falling: Secondary | ICD-10-CM | POA: Diagnosis not present

## 2021-05-24 DIAGNOSIS — M6281 Muscle weakness (generalized): Secondary | ICD-10-CM | POA: Diagnosis not present

## 2021-05-26 DIAGNOSIS — Z9181 History of falling: Secondary | ICD-10-CM | POA: Diagnosis not present

## 2021-05-26 DIAGNOSIS — M6281 Muscle weakness (generalized): Secondary | ICD-10-CM | POA: Diagnosis not present

## 2021-05-26 DIAGNOSIS — R2681 Unsteadiness on feet: Secondary | ICD-10-CM | POA: Diagnosis not present

## 2021-05-27 ENCOUNTER — Encounter: Payer: Self-pay | Admitting: Orthopedic Surgery

## 2021-05-27 ENCOUNTER — Non-Acute Institutional Stay (SKILLED_NURSING_FACILITY): Payer: Medicare Other | Admitting: Orthopedic Surgery

## 2021-05-27 DIAGNOSIS — E039 Hypothyroidism, unspecified: Secondary | ICD-10-CM | POA: Diagnosis not present

## 2021-05-27 DIAGNOSIS — I10 Essential (primary) hypertension: Secondary | ICD-10-CM

## 2021-05-27 DIAGNOSIS — E1151 Type 2 diabetes mellitus with diabetic peripheral angiopathy without gangrene: Secondary | ICD-10-CM

## 2021-05-27 DIAGNOSIS — F02818 Dementia in other diseases classified elsewhere, unspecified severity, with other behavioral disturbance: Secondary | ICD-10-CM

## 2021-05-27 DIAGNOSIS — G3109 Other frontotemporal dementia: Secondary | ICD-10-CM

## 2021-05-27 DIAGNOSIS — E782 Mixed hyperlipidemia: Secondary | ICD-10-CM

## 2021-05-27 DIAGNOSIS — E1169 Type 2 diabetes mellitus with other specified complication: Secondary | ICD-10-CM

## 2021-05-27 DIAGNOSIS — R634 Abnormal weight loss: Secondary | ICD-10-CM | POA: Diagnosis not present

## 2021-05-27 DIAGNOSIS — N390 Urinary tract infection, site not specified: Secondary | ICD-10-CM

## 2021-05-27 DIAGNOSIS — F339 Major depressive disorder, recurrent, unspecified: Secondary | ICD-10-CM

## 2021-05-27 DIAGNOSIS — E871 Hypo-osmolality and hyponatremia: Secondary | ICD-10-CM

## 2021-05-27 NOTE — Progress Notes (Signed)
?Location:  [facility] ?Nursing Home Room Number: N29/A ?Place of Service:  SNF (31) ?Provider: Octavia Heir, NP ? ?Patient Care Team: ?Mahlon Gammon, MD as PCP - General (Internal Medicine) ? ?Extended Emergency Contact Information ?Primary Emergency Contact: Dara Lords ?Mobile Phone: [phone] ?Relation: Sister ?Secondary Emergency Contact: Kennedy,Michael ?Home Phone: [phone] ?Mobile Phone: [phone] ?Relation: Brother ? ?Code Status:  DNR ?Goals of care: Advanced Directive information ? ?  05/27/2021  ? 10:27 AM  ?Advanced Directives  ?Does Patient Have a Medical Advance Directive? Yes  ?Type of Estate agent of [place];Living will;Out of facility DNR (pink MOST or yellow form)  ?Does patient want to make changes to medical advance directive? No - Patient declined  ?Copy of Healthcare Power of Attorney in Chart? Yes - validated most recent copy scanned in chart (See row information)  ?Pre-existing out of facility DNR order (yellow form or pink MOST form) Yellow form placed in chart (order not valid for inpatient use)  ? ? ? ?Chief Complaint  ?Patient presents with  ? Medical Management of Chronic Issues  ?  Routine visit.  ? Quality Metric Gaps  ?  Discuss the need for eye exam, Hep C screening, Shingrix, and Pneumonia vaccine, or post pone if patient refuses.   ? ? ?HPI:  ?Pt is a 71 y.o. female seen today for medical management of chronic diseases.   ? ?She currently resides on the skilled nursing unit at [facility] due to frontotemporal dementia. Past medical history includes: HTN, hypothyroidism, T2DM, alcohol abuse, psychosis,  wernicke-korsakoff syndrome, thrombocytopenia.  ? 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, marked severe cerebral atrophy temporal parietal lobes, ambulates with wheelchair, continues to have tics of fake sneezing and banging, afternoon  Depakote increased- behaviors have improved, remains on Seroquel and ativan prn, no further workup per goals of care  ?Recurrent UTI- H/o ESBL E.coli, remains on cranberry supplement for prevention ?T2DM- A1c 6.1 12/07/2020, sugars averaging 100-130's, no recent hypoglycemia, remains on Lantus, metformin,ace, asa and statin ?HTN- BUN/creat 30/0.7 04/21/2021, remains on lisinopril ?HLD- LDL 50 12/07/2020, remains on statin ?Hypothyroidism- TSH 2.52 12/07/2020, remains on levothyroxine ?Hyponatremia- Na 139 04/21/2021, remains on sodium tablets ?Depression- followed by [facility], remains on Celexa and ativan prn ? ?No recent falls or injuries.  ? ?Recent blood pressures: ? 04/11- 92/53 ? 04/04- 126/67 ? 03/28- 119/78 ? ?Recent weights: ? 04/03- 160.5 lbs ? 03/01- 164.7 lbs ? 02/01- 164.2 lbs  ? ?Past Medical History:  ?Diagnosis Date  ? Dementia (HCC)   ? Diabetes mellitus without complication (HCC)   ? 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 Thyroid disease   ? ?History reviewed. No pertinent surgical history. ? ?No Known Allergies ? ?Outpatient Encounter Medications as of 05/27/2021  ?Medication Sig  ? acetaminophen (TYLENOL) 500 MG tablet Take 1,000 mg by mouth 2 (two) times daily as needed for fever (pain).  ? AMBULATORY NON FORMULARY MEDICATION peg3350-sod sul-NaCl-[id number]-[id number]-C  ?powder in packet; 100-7.5-2.691 gram; amt: 1 Capful (17 grams); oral  ?Special Instructions: Mix 1 capful (17 grams) in 4-8 oz of H2O and drink po daily  ?as needed for constipation ?Once A Day - PRN  ? aspirin 81 MG chewable tablet Chew by mouth daily.  ? calcium carbonate (OS-CAL) 600 MG TABS tablet Take 600 mg by mouth daily with breakfast.  ? Camphor-Menthol-Methyl Sal (SALONPAS) 3.02-19-08 % PTCH Place 1 patch onto the  skin See admin instructions. Apply one patch transdermally to left aka stump daily as needed for pain  ? citalopram (CELEXA) 10 MG tablet Take 10 mg by mouth every morning.  ? Cranberry 450 MG TABS Take by mouth daily.  ?  divalproex (DEPAKOTE SPRINKLE) 125 MG capsule Take 125 mg by mouth 2 (two) times daily.  ? divalproex (DEPAKOTE) 250 MG DR tablet Take 250 mg by mouth daily. In Afternoon  ? Docusate Sodium 100 MG capsule Take 100 mg by mouth 2 (two) times daily.  ? guaiFENesin (ROBITUSSIN) 100 MG/5ML liquid Take 200 mg by mouth every 4 (four) hours as needed for cough or congestion.  ? insulin glargine (LANTUS SOLOSTAR) 100 UNIT/ML Solostar Pen Inject 6 Units into the skin daily. Hold for Blood Sugar less than 110- PRIME PEN WITH 2 UNITS PRIOR TO EACH USE-USE PEN WITHIN 28 DAYS  ? levothyroxine (SYNTHROID) 50 MCG tablet Take 50 mcg by mouth daily before breakfast.  ? lisinopril (ZESTRIL) 2.5 MG tablet Take 2.5 mg by mouth every morning.  ? loperamide (IMODIUM A-D) 2 MG tablet Take 2 mg by mouth every 8 (eight) hours as needed for diarrhea or loose stools.  ? LORazepam (ATIVAN) 0.5 MG tablet Take 0.5 mg by mouth daily as needed (If having behavior issues).  ? LORazepam (ATIVAN) 1 MG tablet Take 0.5 tablets (0.5 mg total) by mouth every 6 (six) hours as needed.  ? melatonin 3 MG TABS tablet Take 3 mg by mouth at bedtime.  ? metFORMIN (GLUCOPHAGE) 1000 MG tablet Take 1,000 mg by mouth 2 (two) times daily.  ? Multiple Vitamins-Minerals (HEALTHY EYES/LUTEIN-ZEAXANTHIN) CAPS Take 1 capsule by mouth every morning.  ? NYAMYC powder Apply 1 application topically 2 (two) times daily as needed (groin rash).  ? QUEtiapine (SEROQUEL) 50 MG tablet Take by mouth 2 (two) times daily. 25 mg in the am ?50 mg in pm  ? simvastatin (ZOCOR) 20 MG tablet Take 20 mg by mouth at bedtime.  ? sodium chloride 1 g tablet Take 1 g by mouth 2 (two) times daily.  ? Sodium Fluoride (PREVIDENT 5000 BOOSTER PLUS) 1.1 % PSTE Place onto teeth daily. Brush on teeth with a toothbrush after PM mouth care. Spit out excess and do not rinse  ? thiamine 100 MG tablet Take 100 mg by mouth every morning. Vitamin B1  ? vitamin B-12 (CYANOCOBALAMIN) 500 MCG tablet Take 500 mcg  by mouth daily.  ? zinc oxide 20 % ointment Apply 1 application topically 3 (three) times daily as needed (apply to buttocks for redness).  ? [DISCONTINUED] polyethylene glycol (MIRALAX / GLYCOLAX) 17 g packet Take 17 g by mouth daily as needed.  ? ?No facility-administered encounter medications on file as of 05/27/2021.  ? ? ?Review of Systems  ?Unable to perform ROS: Dementia  ? ?Immunization History  ?Administered Date(s) Administered  ? Influenza-Unspecified 11/11/2019, 11/11/2020  ? Moderna SARS-COV2 Booster Vaccination 02/02/2021  ? Moderna Sars-Covid-2 Vaccination 03/06/2019, 04/03/2019, 12/18/2019, 11/11/2020  ? Pneumococcal Conjugate-13 12/09/2016  ? Pneumococcal Polysaccharide-23 12/09/2013  ? Tdap 03/12/2012  ? Zoster, Live 11/26/2012  ? ?Pertinent  Health Maintenance Due  ?Topic Date Due  ? OPHTHALMOLOGY EXAM  Never done  ? HEMOGLOBIN A1C  06/07/2021  ? INFLUENZA VACCINE  09/13/2021  ? FOOT EXAM  03/09/2022  ? MAMMOGRAM  Discontinued  ? DEXA SCAN  Discontinued  ? COLONOSCOPY (Pts 45-67yrs Insurance coverage will need to be confirmed)  Discontinued  ? ? ?  10/24/2020  ?  8:00 PM 10/25/2020  ? 11:00 AM 10/25/2020  ?  7:10 PM 10/26/2020  ? 12:00 PM 03/04/2021  ?  2:48 PM  ?Fall Risk  ?Falls in the past year?     1  ?Was there an injury with Fall?     0  ?Fall Risk Category Calculator     1  ?Fall Risk Category     Low  ?Patient Fall Risk Level High fall risk High fall risk High fall risk High fall risk Moderate fall risk  ?Patient at Risk for Falls Due to     History of fall(s);Impaired balance/gait;Impaired mobility  ?Fall risk Follow up     Falls evaluation completed;Education provided;Falls prevention discussed  ? ?Functional Status Survey: ?  ? ?Vitals:  ? 05/27/21 1017  ?BP: (!) 93/53  ?Pulse: 76  ?Resp: 16  ?Temp: (!) 97.4 ?F (36.3 ?C)  ?SpO2: 95%  ?Weight: 160 lb 8 oz (72.8 kg)  ?Height: 5\' 7"  (1.702 m)  ? ?Body mass index is 25.14 kg/m?Marland Kitchen. ?Physical Exam ?Vitals reviewed.  ?Constitutional:   ?   General:  She is not in acute distress. ?HENT:  ?   Head: Normocephalic.  ?   Right Ear: There is no impacted cerumen.  ?   Left Ear: There is no impacted cerumen.  ?   Nose: Nose normal.  ?   Mouth/Throat:  ?

## 2021-05-30 DIAGNOSIS — E119 Type 2 diabetes mellitus without complications: Secondary | ICD-10-CM | POA: Diagnosis not present

## 2021-05-30 LAB — HEMOGLOBIN A1C: Hemoglobin A1C: 6

## 2021-05-31 DIAGNOSIS — Z9181 History of falling: Secondary | ICD-10-CM | POA: Diagnosis not present

## 2021-05-31 DIAGNOSIS — M6281 Muscle weakness (generalized): Secondary | ICD-10-CM | POA: Diagnosis not present

## 2021-05-31 DIAGNOSIS — R2681 Unsteadiness on feet: Secondary | ICD-10-CM | POA: Diagnosis not present

## 2021-06-01 DIAGNOSIS — H2513 Age-related nuclear cataract, bilateral: Secondary | ICD-10-CM | POA: Diagnosis not present

## 2021-06-01 DIAGNOSIS — E119 Type 2 diabetes mellitus without complications: Secondary | ICD-10-CM | POA: Diagnosis not present

## 2021-06-02 DIAGNOSIS — M6281 Muscle weakness (generalized): Secondary | ICD-10-CM | POA: Diagnosis not present

## 2021-06-02 DIAGNOSIS — R2681 Unsteadiness on feet: Secondary | ICD-10-CM | POA: Diagnosis not present

## 2021-06-02 DIAGNOSIS — D649 Anemia, unspecified: Secondary | ICD-10-CM | POA: Diagnosis not present

## 2021-06-02 DIAGNOSIS — Z9181 History of falling: Secondary | ICD-10-CM | POA: Diagnosis not present

## 2021-06-02 LAB — BASIC METABOLIC PANEL
BUN: 24 — AB (ref 4–21)
CO2: 28 — AB (ref 13–22)
Chloride: 105 (ref 99–108)
Creatinine: 0.6 (ref 0.5–1.1)
Glucose: 113
Potassium: 4.6 mEq/L (ref 3.5–5.1)
Sodium: 140 (ref 137–147)

## 2021-06-02 LAB — HEPATIC FUNCTION PANEL
ALT: 9 U/L (ref 7–35)
AST: 11 — AB (ref 13–35)
Alkaline Phosphatase: 40 (ref 25–125)
Bilirubin, Total: 0.3

## 2021-06-02 LAB — COMPREHENSIVE METABOLIC PANEL
Albumin: 3.8 (ref 3.5–5.0)
Calcium: 9.1 (ref 8.7–10.7)
Globulin: 2.4
eGFR: 98

## 2021-06-02 LAB — CBC AND DIFFERENTIAL
HCT: 38 (ref 36–46)
Hemoglobin: 12.6 (ref 12.0–16.0)
Platelets: 193 10*3/uL (ref 150–400)
WBC: 7.1

## 2021-06-02 LAB — CBC: RBC: 4.33 (ref 3.87–5.11)

## 2021-06-06 ENCOUNTER — Non-Acute Institutional Stay (SKILLED_NURSING_FACILITY): Payer: Medicare Other | Admitting: Orthopedic Surgery

## 2021-06-06 ENCOUNTER — Encounter: Payer: Self-pay | Admitting: Orthopedic Surgery

## 2021-06-06 DIAGNOSIS — E039 Hypothyroidism, unspecified: Secondary | ICD-10-CM | POA: Diagnosis not present

## 2021-06-06 DIAGNOSIS — N390 Urinary tract infection, site not specified: Secondary | ICD-10-CM

## 2021-06-06 DIAGNOSIS — Z9181 History of falling: Secondary | ICD-10-CM | POA: Diagnosis not present

## 2021-06-06 DIAGNOSIS — E871 Hypo-osmolality and hyponatremia: Secondary | ICD-10-CM | POA: Diagnosis not present

## 2021-06-06 DIAGNOSIS — E782 Mixed hyperlipidemia: Secondary | ICD-10-CM | POA: Diagnosis not present

## 2021-06-06 DIAGNOSIS — F02818 Dementia in other diseases classified elsewhere, unspecified severity, with other behavioral disturbance: Secondary | ICD-10-CM

## 2021-06-06 DIAGNOSIS — E1151 Type 2 diabetes mellitus with diabetic peripheral angiopathy without gangrene: Secondary | ICD-10-CM | POA: Diagnosis not present

## 2021-06-06 DIAGNOSIS — I10 Essential (primary) hypertension: Secondary | ICD-10-CM

## 2021-06-06 DIAGNOSIS — E1169 Type 2 diabetes mellitus with other specified complication: Secondary | ICD-10-CM | POA: Diagnosis not present

## 2021-06-06 DIAGNOSIS — G3109 Other frontotemporal dementia: Secondary | ICD-10-CM | POA: Diagnosis not present

## 2021-06-06 DIAGNOSIS — F339 Major depressive disorder, recurrent, unspecified: Secondary | ICD-10-CM

## 2021-06-06 DIAGNOSIS — R2681 Unsteadiness on feet: Secondary | ICD-10-CM | POA: Diagnosis not present

## 2021-06-06 DIAGNOSIS — M6281 Muscle weakness (generalized): Secondary | ICD-10-CM | POA: Diagnosis not present

## 2021-06-06 MED ORDER — LORAZEPAM 1 MG PO TABS: 1.0000 mg | ORAL_TABLET | Freq: Two times a day (BID) | ORAL | 1 refills | Status: DC

## 2021-06-06 NOTE — Progress Notes (Signed)
?Location:  [facility] ?Nursing Home Room Number: N29/A ?Place of Service:  SNF (31) ?Provider: Octavia Heir, NP ? ? ?Patient Care Team: ?Mahlon Gammon, MD as PCP - General (Internal Medicine) ? ?Extended Emergency Contact Information ?Primary Emergency Contact: Dara Lords ?Mobile Phone: [phone] ?Relation: Sister ?Secondary Emergency Contact: Kennedy,Michael ?Home Phone: [phone] ?Mobile Phone: [phone] ?Relation: Brother ? ?Code Status:  DNR ?Goals of care: Advanced Directive information ? ?  06/06/2021  ?  9:53 AM  ?Advanced Directives  ?Does Patient Have a Medical Advance Directive? Yes  ?Type of Estate agent of [place];Living will;Out of facility DNR (pink MOST or yellow form)  ?Does patient want to make changes to medical advance directive? No - Patient declined  ?Copy of Healthcare Power of Attorney in Chart? Yes - validated most recent copy scanned in chart (See row information)  ?Pre-existing out of facility DNR order (yellow form or pink MOST form) Yellow form placed in chart (order not valid for inpatient use)  ? ? ? ?Chief Complaint  ?Patient presents with  ? Acute Visit  ?  Increased anxiety  ? ? ?HPI:  ?Pt is a 71 y.o. female seen today for an acute visit for increased behaviors.  ? 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 and right periventricular basal ganglia, marked severe cerebral atrophy temporal parietal lobes, increased tics of yelling and fake sneezing over the weekend, Ammonia level 26 06/02/2021, afternoon Depakote recently increased, remains on Seroquel and ativan prn, no further workup per goals of care  ?Recurrent UTI- H/o ESBL E.coli, remains on cranberry supplement for prevention ?T2DM- A1c 6.0 05/30/2021, sugars averaging 130's, no recent hypoglycemia, remains on Lantus, metformin,ace, asa and statin ?HTN- BUN/creat 24/0.6 06/02/2021, remains on lisinopril ?HLD- LDL 50 12/07/2020, remains on  statin ?Hypothyroidism- TSH 2.52 12/07/2020, remains on levothyroxine ?Hyponatremia- Na 140 06/02/2021, remains on sodium tablets ?Depression- followed by [facility], remains on Celexa and ativan prn ? ? ?Past Medical History:  ?Diagnosis Date  ? Dementia (HCC)   ? Diabetes mellitus without complication (HCC)   ? 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 Thyroid disease   ? ?History reviewed. No pertinent surgical history. ? ?No Known Allergies ? ?Outpatient Encounter Medications as of 06/06/2021  ?Medication Sig  ? acetaminophen (TYLENOL) 500 MG tablet Take 1,000 mg by mouth 2 (two) times daily as needed for fever (pain).  ? AMBULATORY NON FORMULARY MEDICATION peg3350-sod sul-NaCl-KCl-asb-C  ?powder in packet; 100-7.5-2.691 gram; amt: 1 Capful (17 grams); oral  ?Special Instructions: Mix 1 capful (17 grams) in 4-8 oz of H2O and drink po daily  ?as needed for constipation ?Once A Day - PRN  ? aspirin 81 MG chewable tablet Chew by mouth daily.  ? calcium carbonate (OS-CAL) 600 MG TABS tablet Take 600 mg by mouth daily with breakfast.  ? Camphor-Menthol-Methyl Sal (SALONPAS) 3.02-19-08 % PTCH Place 1 patch onto the skin See admin instructions. Apply one patch transdermally to left aka stump daily as needed for pain  ? citalopram (CELEXA) 10 MG tablet Take 10 mg by mouth every morning.  ? Cranberry 450 MG TABS Take by mouth daily.  ? divalproex (DEPAKOTE SPRINKLE) 125 MG capsule Take 125 mg by mouth 2 (two) times daily.  ? divalproex (DEPAKOTE) 250 MG DR tablet Take 250 mg by mouth daily. In Afternoon  ? Docusate Sodium 100 MG capsule Take 100 mg by mouth 2 (two) times daily.  ? guaiFENesin (ROBITUSSIN) 100 MG/5ML liquid Take 200  mg by mouth every 4 (four) hours as needed for cough or congestion.  ? insulin glargine (LANTUS SOLOSTAR) 100 UNIT/ML Solostar Pen Inject 6 Units into the skin daily. Hold for Blood Sugar less than 110- PRIME PEN WITH 2 UNITS PRIOR TO EACH USE-USE PEN WITHIN 28 DAYS  ? levothyroxine (SYNTHROID) 50  MCG tablet Take 50 mcg by mouth daily before breakfast.  ? lisinopril (ZESTRIL) 2.5 MG tablet Take 2.5 mg by mouth every morning.  ? loperamide (IMODIUM A-D) 2 MG tablet Take 2 mg by mouth every 8 (eight) hours as needed for diarrhea or loose stools.  ? LORazepam (ATIVAN) 0.5 MG tablet Take 0.5 mg by mouth daily as needed (If having behavior issues).  ? melatonin 3 MG TABS tablet Take 3 mg by mouth at bedtime.  ? metFORMIN (GLUCOPHAGE) 1000 MG tablet Take 1,000 mg by mouth 2 (two) times daily.  ? Multiple Vitamins-Minerals (HEALTHY EYES/LUTEIN-ZEAXANTHIN) CAPS Take 1 capsule by mouth every morning.  ? NYAMYC powder Apply 1 application topically 2 (two) times daily as needed (groin rash).  ? QUEtiapine (SEROQUEL) 50 MG tablet Take by mouth 2 (two) times daily. 25 mg in the am ?50 mg in pm  ? simvastatin (ZOCOR) 20 MG tablet Take 20 mg by mouth at bedtime.  ? sodium chloride 1 g tablet Take 1 g by mouth 2 (two) times daily.  ? Sodium Fluoride (PREVIDENT 5000 BOOSTER PLUS) 1.1 % PSTE Place onto teeth daily. Brush on teeth with a toothbrush after PM mouth care. Spit out excess and do not rinse  ? thiamine 100 MG tablet Take 100 mg by mouth every morning. Vitamin B1  ? vitamin B-12 (CYANOCOBALAMIN) 500 MCG tablet Take 500 mcg by mouth daily.  ? zinc oxide 20 % ointment Apply 1 application topically 3 (three) times daily as needed (apply to buttocks for redness).  ? [DISCONTINUED] LORazepam (ATIVAN) 1 MG tablet Take 0.5 tablets (0.5 mg total) by mouth every 6 (six) hours as needed.  ? ?No facility-administered encounter medications on file as of 06/06/2021.  ? ? ?Review of Systems  ?Unable to perform ROS: Dementia  ? ?Immunization History  ?Administered Date(s) Administered  ? Influenza-Unspecified 11/11/2019, 11/11/2020  ? Moderna SARS-COV2 Booster Vaccination 02/02/2021  ? Moderna Sars-Covid-2 Vaccination 03/06/2019, 04/03/2019, 12/18/2019, 11/11/2020  ? Pneumococcal Conjugate-13 12/09/2016  ? Pneumococcal  Polysaccharide-23 12/09/2013  ? Tdap 03/12/2012  ? Zoster, Live 11/26/2012  ? ?Pertinent  Health Maintenance Due  ?Topic Date Due  ? OPHTHALMOLOGY EXAM  Never done  ? INFLUENZA VACCINE  09/13/2021  ? HEMOGLOBIN A1C  11/29/2021  ? FOOT EXAM  03/09/2022  ? MAMMOGRAM  Discontinued  ? DEXA SCAN  Discontinued  ? COLONOSCOPY (Pts 45-11yrs Insurance coverage will need to be confirmed)  Discontinued  ? ? ?  10/24/2020  ?  8:00 PM 10/25/2020  ? 11:00 AM 10/25/2020  ?  7:10 PM 10/26/2020  ? 12:00 PM 03/04/2021  ?  2:48 PM  ?Fall Risk  ?Falls in the past year?     1  ?Was there an injury with Fall?     0  ?Fall Risk Category Calculator     1  ?Fall Risk Category     Low  ?Patient Fall Risk Level High fall risk High fall risk High fall risk High fall risk Moderate fall risk  ?Patient at Risk for Falls Due to     History of fall(s);Impaired balance/gait;Impaired mobility  ?Fall risk Follow up     Falls evaluation  completed;Education provided;Falls prevention discussed  ? ?Functional Status Survey: ?  ? ?Vitals:  ? 06/06/21 0943  ?BP: 124/72  ?Pulse: (!) 117  ?Resp: 16  ?Temp: (!) 97.5 ?F (36.4 ?C)  ?SpO2: 96%  ?Weight: 160 lb 8 oz (72.8 kg)  ?Height: 5\' 7"  (1.702 m)  ? ?Body mass index is 25.14 kg/m?Marland Kitchen. ?Physical Exam ?Vitals reviewed.  ?Constitutional:   ?   General: She is not in acute distress. ?HENT:  ?   Head: Normocephalic.  ?Eyes:  ?   General:     ?   Right eye: No discharge.     ?   Left eye: No discharge.  ?Cardiovascular:  ?   Rate and Rhythm: Normal rate and regular rhythm.  ?   Pulses: Normal pulses.  ?   Heart sounds: Normal heart sounds.  ?Pulmonary:  ?   Effort: Pulmonary effort is normal. No respiratory distress.  ?   Breath sounds: Normal breath sounds. No wheezing.  ?Abdominal:  ?   General: Bowel sounds are normal. There is no distension.  ?   Palpations: Abdomen is soft.  ?   Tenderness: There is no abdominal tenderness.  ?Musculoskeletal:  ?   Cervical back: Neck supple.  ?   Right lower leg: No edema.  ?    Comments: Left BKA  ?Skin: ?   General: Skin is warm and dry.  ?Neurological:  ?   General: No focal deficit present.  ?   Mental Status: She is alert. Mental status is at baseline.  ?   Motor: Weakness present.  ?   Gait: Gait a

## 2021-06-08 DIAGNOSIS — Z9181 History of falling: Secondary | ICD-10-CM | POA: Diagnosis not present

## 2021-06-08 DIAGNOSIS — R2681 Unsteadiness on feet: Secondary | ICD-10-CM | POA: Diagnosis not present

## 2021-06-08 DIAGNOSIS — R300 Dysuria: Secondary | ICD-10-CM | POA: Diagnosis not present

## 2021-06-08 DIAGNOSIS — M6281 Muscle weakness (generalized): Secondary | ICD-10-CM | POA: Diagnosis not present

## 2021-06-08 DIAGNOSIS — N390 Urinary tract infection, site not specified: Secondary | ICD-10-CM | POA: Diagnosis not present

## 2021-06-10 ENCOUNTER — Non-Acute Institutional Stay (SKILLED_NURSING_FACILITY): Payer: Medicare Other | Admitting: Orthopedic Surgery

## 2021-06-10 ENCOUNTER — Encounter: Payer: Self-pay | Admitting: Orthopedic Surgery

## 2021-06-10 DIAGNOSIS — N3000 Acute cystitis without hematuria: Secondary | ICD-10-CM | POA: Diagnosis not present

## 2021-06-10 DIAGNOSIS — N390 Urinary tract infection, site not specified: Secondary | ICD-10-CM | POA: Diagnosis not present

## 2021-06-10 NOTE — Progress Notes (Signed)
?Location:  [facility] Room Number: N29/A ?Place of Service:  SNF (31) ?Provider:  Yvonna Alanis, NP  ? ?Virgie Dad, MD ? ?Patient Care Team: ?Virgie Dad, MD as PCP - General (Internal Medicine) ? ?Extended Emergency Contact Information ?Primary Emergency Contact: Norva Riffle ?Mobile Phone: [phone] ?Relation: Sister ?Secondary Emergency Contact: Kennedy,Michael ?Home Phone: [phone] ?Mobile Phone: [phone] ?Relation: Brother ? ?Code Status:  DNR ?Goals of care: Advanced Directive information ? ?  06/06/2021  ?  9:53 AM  ?Advanced Directives  ?Does Patient Have a Medical Advance Directive? Yes  ?Type of Paramedic of [place];Living will;Out of facility DNR (pink MOST or yellow form)  ?Does patient want to make changes to medical advance directive? No - Patient declined  ?Copy of [place] in Chart? Yes - validated most recent copy scanned in chart (See row information)  ?Pre-existing out of facility DNR order (yellow form or pink MOST form) Yellow form placed in chart (order not valid for inpatient use)  ? ? ? ?Chief Complaint  ?Patient presents with  ? Acute Visit  ?  UTI  ? ? ?HPI:  ?Alexis Becker is a 71 y.o. female seen today for acute visit due to positive urine culture.  ? ?04/24 she was seen due to confusion and increased behaviors of yelling/fake sneezing. Ammonia level 26 06/02/2021. Her Depakote had recently been increased due to worsening behaviors. She is also on Seroquel and ativan. UA and culture collected 06/08/2021. Urine culture revealed > 100,000 cfu/mL of Escherichia coli (ESBL). Poor historian due to frontotemporal dementia. Nursing staff reports urine has a slight odor. Vitals stable, afebrile.  ? ? ?Past Medical History:  ?Diagnosis Date  ? Dementia ([place])   ? Diabetes mellitus without complication ([place])   ? 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 Thyroid disease   ? ?No past surgical history on file. ? ?No Known  Allergies ? ?Outpatient Encounter Medications as of 06/10/2021  ?Medication Sig  ? acetaminophen (TYLENOL) 500 MG tablet Take 1,000 mg by mouth 2 (two) times daily as needed for fever (pain).  ? AMBULATORY NON FORMULARY MEDICATION peg3350-sod sul-NaCl-KCl-[id number]-C  ?powder in packet; 100-7.5-2.691 gram; amt: 1 Capful (17 grams); oral  ?Special Instructions: Mix 1 capful (17 grams) in 4-8 oz of H2O and drink po daily  ?as needed for constipation ?Once A Day - PRN  ? aspirin 81 MG chewable tablet Chew by mouth daily.  ? calcium carbonate (OS-CAL) 600 MG TABS tablet Take 600 mg by mouth daily with breakfast.  ? Camphor-Menthol-Methyl Sal (SALONPAS) 3.02-19-08 % PTCH Place 1 patch onto the skin See admin instructions. Apply one patch transdermally to left aka stump daily as needed for pain  ? citalopram (CELEXA) 10 MG tablet Take 10 mg by mouth every morning.  ? Cranberry 450 MG TABS Take by mouth daily.  ? divalproex (DEPAKOTE SPRINKLE) 125 MG capsule Take 125 mg by mouth 2 (two) times daily.  ? divalproex (DEPAKOTE) 250 MG DR tablet Take 250 mg by mouth daily. In Afternoon  ? Docusate Sodium 100 MG capsule Take 100 mg by mouth 2 (two) times daily.  ? guaiFENesin (ROBITUSSIN) 100 MG/5ML liquid Take 200 mg by mouth every 4 (four) hours as needed for cough or congestion.  ? insulin glargine (LANTUS SOLOSTAR) 100 UNIT/ML Solostar Pen Inject 6 Units into the skin daily. Hold for Blood Sugar less than 110- PRIME PEN WITH 2 UNITS PRIOR TO EACH USE-USE PEN WITHIN 28  DAYS  ? levothyroxine (SYNTHROID) 50 MCG tablet Take 50 mcg by mouth daily before breakfast.  ? lisinopril (ZESTRIL) 2.5 MG tablet Take 2.5 mg by mouth every morning.  ? loperamide (IMODIUM A-D) 2 MG tablet Take 2 mg by mouth every 8 (eight) hours as needed for diarrhea or loose stools.  ? LORazepam (ATIVAN) 0.5 MG tablet Take 0.5 mg by mouth 2 (two) times daily as needed (If having behavior issues).  ? LORazepam (ATIVAN) 1 MG tablet Take 1 tablet (1 mg total) by mouth  2 (two) times daily. Give at 7am and 4pm  ? melatonin 3 MG TABS tablet Take 3 mg by mouth at bedtime.  ? metFORMIN (GLUCOPHAGE) 1000 MG tablet Take 1,000 mg by mouth 2 (two) times daily.  ? Multiple Vitamins-Minerals (HEALTHY EYES/LUTEIN-ZEAXANTHIN) CAPS Take 1 capsule by mouth every morning.  ? NYAMYC powder Apply 1 application topically 2 (two) times daily as needed (groin rash).  ? QUEtiapine (SEROQUEL) 50 MG tablet Take by mouth 2 (two) times daily. 25 mg in the am ?50 mg in pm  ? simvastatin (ZOCOR) 20 MG tablet Take 20 mg by mouth at bedtime.  ? sodium chloride 1 g tablet Take 1 g by mouth 2 (two) times daily.  ? Sodium Fluoride (PREVIDENT 5000 BOOSTER PLUS) 1.1 % PSTE Place onto teeth daily. Brush on teeth with a toothbrush after PM mouth care. Spit out excess and do not rinse  ? thiamine 100 MG tablet Take 100 mg by mouth every morning. Vitamin B1  ? vitamin B-12 (CYANOCOBALAMIN) 500 MCG tablet Take 500 mcg by mouth daily.  ? zinc oxide 20 % ointment Apply 1 application topically 3 (three) times daily as needed (apply to buttocks for redness).  ? ?No facility-administered encounter medications on file as of 06/10/2021.  ? ? ?Review of Systems  ?Unable to perform ROS: Dementia  ? ?Immunization History  ?Administered Date(s) Administered  ? Influenza-Unspecified 11/11/2019, 11/11/2020  ? Moderna SARS-COV2 Booster Vaccination 02/02/2021  ? Moderna Sars-Covid-2 Vaccination 03/06/2019, 04/03/2019, 12/18/2019, 11/11/2020  ? Pneumococcal Conjugate-13 12/09/2016  ? Pneumococcal Polysaccharide-23 12/09/2013  ? Tdap 03/12/2012  ? Zoster, Live 11/26/2012  ? ?Pertinent  Health Maintenance Due  ?Topic Date Due  ? INFLUENZA VACCINE  09/13/2021  ? HEMOGLOBIN A1C  11/29/2021  ? FOOT EXAM  03/09/2022  ? OPHTHALMOLOGY EXAM  06/02/2022  ? MAMMOGRAM  Discontinued  ? DEXA SCAN  Discontinued  ? COLONOSCOPY (Pts 45-69yrs Insurance coverage will need to be confirmed)  Discontinued  ? ? ?  10/24/2020  ?  8:00 PM 10/25/2020  ? 11:00 AM  10/25/2020  ?  7:10 PM 10/26/2020  ? 12:00 PM 03/04/2021  ?  2:48 PM  ?Fall Risk  ?Falls in the past year?     1  ?Was there an injury with Fall?     0  ?Fall Risk Category Calculator     1  ?Fall Risk Category     Low  ?Patient Fall Risk Level High fall risk High fall risk High fall risk High fall risk Moderate fall risk  ?Patient at Risk for Falls Due to     History of fall(s);Impaired balance/gait;Impaired mobility  ?Fall risk Follow up     Falls evaluation completed;Education provided;Falls prevention discussed  ? ?Functional Status Survey: ?  ? ?Vitals:  ? 06/10/21 1256  ?BP: (!) 125/57  ?Pulse: 90  ?Resp: 18  ?Temp: (!) 97.1 ?F (36.2 ?C)  ?SpO2: 95%  ?Weight: 160 lb 8 oz (72.8 kg)  ?  Height: 5\' 7"  (1.702 m)  ? ?Body mass index is 25.14 kg/m?Marland Kitchen ?Physical Exam ?Vitals reviewed.  ?Constitutional:   ?   General: She is not in acute distress. ?HENT:  ?   Head: Normocephalic.  ?Eyes:  ?   General:     ?   Right eye: No discharge.     ?   Left eye: No discharge.  ?Cardiovascular:  ?   Rate and Rhythm: Normal rate and regular rhythm.  ?   Pulses: Normal pulses.  ?   Heart sounds: Normal heart sounds.  ?Pulmonary:  ?   Effort: Pulmonary effort is normal. No respiratory distress.  ?   Breath sounds: Normal breath sounds. No wheezing.  ?Abdominal:  ?   General: Bowel sounds are normal. There is no distension.  ?   Palpations: Abdomen is soft.  ?   Tenderness: There is no abdominal tenderness.  ?Musculoskeletal:  ?   Cervical back: Neck supple.  ?   Right lower leg: No edema.  ?   Left lower leg: No edema.  ?Skin: ?   General: Skin is warm and dry.  ?   Capillary Refill: Capillary refill takes less than 2 seconds.  ?Neurological:  ?   General: No focal deficit present.  ?   Mental Status: She is alert. Mental status is at baseline.  ?   Motor: Weakness present.  ?   Gait: Gait abnormal.  ?   Comments: wheelchair  ?Psychiatric:     ?   Mood and Affect: Mood normal.     ?   Behavior: Behavior normal.  ? ? ?Labs reviewed: ?Recent  Labs  ?  10/23/20 ?1813 10/24/20 ?0232 10/25/20 ?0453 12/07/20 ?0000 04/21/21 ?0000 06/02/21 ?0000  ?NA 145 140 134* 136* 139 140  ?K 4.3 3.9 4.3 4.7 4.7 4.6  ?CL 111 106 104 104 106 105  ?CO2 24 25 21* 24* 21 2

## 2021-07-06 ENCOUNTER — Non-Acute Institutional Stay (SKILLED_NURSING_FACILITY): Payer: Medicare Other | Admitting: Orthopedic Surgery

## 2021-07-06 ENCOUNTER — Encounter: Payer: Self-pay | Admitting: Orthopedic Surgery

## 2021-07-06 DIAGNOSIS — N390 Urinary tract infection, site not specified: Secondary | ICD-10-CM

## 2021-07-06 DIAGNOSIS — E1169 Type 2 diabetes mellitus with other specified complication: Secondary | ICD-10-CM | POA: Diagnosis not present

## 2021-07-06 DIAGNOSIS — G3109 Other frontotemporal dementia: Secondary | ICD-10-CM | POA: Diagnosis not present

## 2021-07-06 DIAGNOSIS — E782 Mixed hyperlipidemia: Secondary | ICD-10-CM | POA: Diagnosis not present

## 2021-07-06 DIAGNOSIS — E039 Hypothyroidism, unspecified: Secondary | ICD-10-CM | POA: Diagnosis not present

## 2021-07-06 DIAGNOSIS — E1151 Type 2 diabetes mellitus with diabetic peripheral angiopathy without gangrene: Secondary | ICD-10-CM | POA: Diagnosis not present

## 2021-07-06 DIAGNOSIS — F02818 Dementia in other diseases classified elsewhere, unspecified severity, with other behavioral disturbance: Secondary | ICD-10-CM | POA: Diagnosis not present

## 2021-07-06 DIAGNOSIS — I10 Essential (primary) hypertension: Secondary | ICD-10-CM

## 2021-07-06 DIAGNOSIS — E871 Hypo-osmolality and hyponatremia: Secondary | ICD-10-CM | POA: Diagnosis not present

## 2021-07-06 DIAGNOSIS — K5901 Slow transit constipation: Secondary | ICD-10-CM | POA: Diagnosis not present

## 2021-07-06 DIAGNOSIS — F339 Major depressive disorder, recurrent, unspecified: Secondary | ICD-10-CM

## 2021-07-06 NOTE — Progress Notes (Addendum)
Location:  [facility] Room Number: 53 Place of Service:  SNF [phone]) Provider:  Octavia Heir, NP   Mahlon Gammon, MD  Patient Care Team: Mahlon Gammon, MD as PCP - General (Internal Medicine)  Extended Emergency Contact Information Primary Emergency Contact: [facility] Phone: [phone] Relation: Sister Secondary Emergency Contact: [facility] Phone: [phone] Mobile Phone: [phone] Relation: Brother  Code Status:  DNR Goals of care: Advanced Directive information    06/06/2021    9:53 AM  Advanced Directives  Does Patient Have a Medical Advance Directive? Yes  Type of Estate agent of [place];Living will;Out of facility DNR (pink MOST or yellow form)  Does patient want to make changes to medical advance directive? No - Patient declined  Copy of Healthcare Power of Attorney in Chart? Yes - validated most recent copy scanned in chart (See row information)  Pre-existing out of facility DNR order (yellow form or pink MOST form) Yellow form placed in chart (order not valid for inpatient use)     Chief Complaint  Patient presents with   Medical Management of Chronic Issues    HPI:  Pt is a 71 y.o. female seen today for medical management of chronic diseases.    She currently resides on the skilled nursing unit at [facility] due to frontotemporal dementia. Past medical history includes: HTN, hypothyroidism, T2DM, alcohol abuse, psychosis,  wernicke-korsakoff syndrome, thrombocytopenia.   Followed by palliative care.   T2DM- A1c 6.0 05/30/2021, sugars averaging 80-100's, no recent hypoglycemia, remains on Lantus, metformin,ace, asa and statin Frontotemporal dementia- MRI 2019 remote infarcts right anterior temporal lobe and right periventricular basal ganglia, marked severe cerebral atrophy temporal parietal lobes, has tics of yelling and fake sneezing- improved after scheduling Seroquel at 4pm,  Ammonia level 26 06/02/2021, remains on Depakote, Seroquel and ativan prn, no further workup per goals of care  Recurrent UTI- H/o ESBL E.coli, 04/26 urine culture > 100,000 cfu/mL of E.coli (ESBL)- given Bactrim 800/160 mg po bid x 5 days,  remains on cranberry supplement for prevention HTN- BUN/creat 24/0.6 06/02/2021, remains on lisinopril HLD- LDL 50 12/07/2020, remains on statin Hypothyroidism- TSH 2.52 12/07/2020, remains on levothyroxine Hyponatremia- Na 140 06/02/2021, remains on sodium tablets Depression- followed by [facility] psych, remains on Celexa and ativan prn Constipation- LBM 05/24, remains on colace  No recent falls or injuries.   Recent blood pressures:  05/23- 117/83  05/16- 136/70  05/09- 118/69  Recent weights:  05/01- 164.5 lbs  04/03- 160.5 lbs  03/01- 164.7 lbs       Past Medical History:  Diagnosis Date   Dementia (HCC)    Diabetes mellitus without complication (HCC)    Hx of BKA, left ([facility]) 10/24/2020   Thyroid disease    No past surgical history on file.  No Known Allergies  Outpatient Encounter Medications as of 07/06/2021  Medication Sig   acetaminophen (TYLENOL) 500 MG tablet Take 1,000 mg by mouth 2 (two) times daily as needed for fever (pain).   AMBULATORY NON FORMULARY MEDICATION peg3350-sod sul-NaCl-KCl-asb-C  powder in packet; 100-7.5-2.691 gram; amt: 1 Capful (17 grams); oral  Special Instructions: Mix 1 capful (17 grams) in 4-8 oz of H2O and drink po daily  as needed for constipation Once A Day - PRN   aspirin 81 MG chewable tablet Chew by mouth daily.   calcium carbonate (OS-CAL) 600 MG TABS tablet Take 600 mg by mouth daily with breakfast.   Camphor-Menthol-Methyl Sal (SALONPAS) 3.02-19-08 %  PTCH Place 1 patch onto the skin See admin instructions. Apply one patch transdermally to left aka stump daily as needed for pain   citalopram (CELEXA) 10 MG tablet Take 10 mg by mouth every morning.   Cranberry 450 MG TABS Take by mouth daily.    divalproex (DEPAKOTE SPRINKLE) 125 MG capsule Take 125 mg by mouth 2 (two) times daily.   divalproex (DEPAKOTE) 250 MG DR tablet Take 250 mg by mouth daily. In Afternoon   Docusate Sodium 100 MG capsule Take 100 mg by mouth 2 (two) times daily.   guaiFENesin (ROBITUSSIN) 100 MG/5ML liquid Take 200 mg by mouth every 4 (four) hours as needed for cough or congestion.   insulin glargine (LANTUS SOLOSTAR) 100 UNIT/ML Solostar Pen Inject 6 Units into the skin daily. Hold for Blood Sugar less than 110- PRIME PEN WITH 2 UNITS PRIOR TO EACH USE-USE PEN WITHIN 28 DAYS   levothyroxine (SYNTHROID) 50 MCG tablet Take 50 mcg by mouth daily before breakfast.   lisinopril (ZESTRIL) 2.5 MG tablet Take 2.5 mg by mouth every morning.   loperamide (IMODIUM A-D) 2 MG tablet Take 2 mg by mouth every 8 (eight) hours as needed for diarrhea or loose stools.   LORazepam (ATIVAN) 0.5 MG tablet Take 0.5 mg by mouth 2 (two) times daily as needed (If having behavior issues).   LORazepam (ATIVAN) 1 MG tablet Take 1 tablet (1 mg total) by mouth 2 (two) times daily. Give at 7am and 4pm   melatonin 3 MG TABS tablet Take 3 mg by mouth at bedtime.   metFORMIN (GLUCOPHAGE) 1000 MG tablet Take 1,000 mg by mouth 2 (two) times daily.   Multiple Vitamins-Minerals (HEALTHY EYES/LUTEIN-ZEAXANTHIN) CAPS Take 1 capsule by mouth every morning.   NYAMYC powder Apply 1 application topically 2 (two) times daily as needed (groin rash).   QUEtiapine (SEROQUEL) 50 MG tablet Take by mouth 2 (two) times daily. 25 mg in the am 50 mg in pm   simvastatin (ZOCOR) 20 MG tablet Take 20 mg by mouth at bedtime.   sodium chloride 1 g tablet Take 1 g by mouth 2 (two) times daily.   Sodium Fluoride (PREVIDENT 5000 BOOSTER PLUS) 1.1 % PSTE Place onto teeth daily. Brush on teeth with a toothbrush after PM mouth care. Spit out excess and do not rinse   thiamine 100 MG tablet Take 100 mg by mouth every morning. Vitamin B1   vitamin B-12 (CYANOCOBALAMIN) 500 MCG  tablet Take 500 mcg by mouth daily.   zinc oxide 20 % ointment Apply 1 application topically 3 (three) times daily as needed (apply to buttocks for redness).   No facility-administered encounter medications on file as of 07/06/2021.    Review of Systems  Unable to perform ROS: Dementia   Immunization History  Administered Date(s) Administered   Influenza-Unspecified 11/11/2019, 11/11/2020   Moderna SARS-COV2 Booster Vaccination 02/02/2021   Moderna Sars-Covid-2 Vaccination 03/06/2019, 04/03/2019, 12/18/2019, 11/11/2020   Pneumococcal Conjugate-13 12/09/2016   Pneumococcal Polysaccharide-23 12/09/2013   Tdap 03/12/2012   Zoster, Live 11/26/2012   Pertinent  Health Maintenance Due  Topic Date Due   INFLUENZA VACCINE  09/13/2021   HEMOGLOBIN A1C  11/29/2021   FOOT EXAM  03/09/2022   OPHTHALMOLOGY EXAM  06/02/2022   MAMMOGRAM  Discontinued   DEXA SCAN  Discontinued   COLONOSCOPY (Pts 45-47yrs Insurance coverage will need to be confirmed)  Discontinued      10/24/2020    8:00 PM 10/25/2020   11:00 AM 10/25/2020  7:10 PM 10/26/2020   12:00 PM 03/04/2021    2:48 PM  Fall Risk  Falls in the past year?     1  Was there an injury with Fall?     0  Fall Risk Category Calculator     1  Fall Risk Category     Low  Patient Fall Risk Level High fall risk High fall risk High fall risk High fall risk Moderate fall risk  Patient at Risk for Falls Due to     History of fall(s);Impaired balance/gait;Impaired mobility  Fall risk Follow up     Falls evaluation completed;Education provided;Falls prevention discussed   Functional Status Survey:    Vitals:   07/06/21 0906  BP: 117/83  Pulse: (!) 53  Resp: 20  Temp: (!) 97.3 F (36.3 C)  SpO2: 98%  Weight: 164 lb 8 oz (74.6 kg)  Height: 5\' 7"  (1.702 m)   Body mass index is 25.76 kg/m. Physical Exam Vitals reviewed.  Constitutional:      General: She is not in acute distress. HENT:     Head: Normocephalic.     Right Ear: There is  no impacted cerumen.     Left Ear: There is no impacted cerumen.     Nose: Nose normal.     Mouth/Throat:     Mouth: Mucous membranes are moist.  Eyes:     General:        Right eye: No discharge.        Left eye: No discharge.  Cardiovascular:     Rate and Rhythm: Normal rate and regular rhythm.     Pulses: Normal pulses.     Heart sounds: Normal heart sounds.  Pulmonary:     Effort: Pulmonary effort is normal. No respiratory distress.     Breath sounds: Normal breath sounds. No wheezing.  Abdominal:     General: Bowel sounds are normal. There is no distension.     Palpations: Abdomen is soft.     Tenderness: There is no abdominal tenderness.  Musculoskeletal:     Cervical back: Neck supple.     Right lower leg: No edema.     Comments: Left AKA  Skin:    General: Skin is warm and dry.     Capillary Refill: Capillary refill takes less than 2 seconds.     Comments: 2 cm linear abrasion to left anterior forearm, CDI, no drainage, surrounding skin intact  Neurological:     General: No focal deficit present.     Mental Status: She is alert. Mental status is at baseline.     Motor: Weakness present.     Gait: Gait abnormal.     Comments: Michiel SitesHoyer, wheelchair  Psychiatric:        Mood and Affect: Mood normal.        Behavior: Behavior normal.        Cognition and Memory: Cognition is impaired. Memory is impaired.     Comments: Nonverbal, does not follow all commands, alert to self    Labs reviewed: Recent Labs    10/23/20 1813 10/24/20 0232 10/25/20 0453 12/07/20 0000 04/21/21 0000 06/02/21 0000  NA 145 140 134* 136* 139 140  K 4.3 3.9 4.3 4.7 4.7 4.6  CL 111 106 104 104 106 105  CO2 24 25 21* 24* 21 28*  GLUCOSE 123* 131* 109*  --   --   --   BUN 28* 26* 18 15 30* 24*  CREATININE 0.62 0.70  0.67 0.7 0.7 0.6  CALCIUM 10.3 9.6 8.4* 9.7 10.0 9.1  MG  --  1.6* 1.9  --   --   --    Recent Labs    10/23/20 1813 10/24/20 0232 12/07/20 0000 04/21/21 0000 06/02/21 0000   AST 17 13* 12* 14 11*  ALT 16 16 8 11 9   ALKPHOS 55 56 51 49 40  BILITOT 0.7 0.6  --   --   --   PROT 7.0 6.7  --   --   --   ALBUMIN 3.1* 3.0* 3.5 4.0 3.8   Recent Labs    10/23/20 1813 10/24/20 0232 10/25/20 0453 12/07/20 0000 04/21/21 0000 06/02/21 0000  WBC 12.5* 12.1* 10.0 10.0 8.1 7.1  NEUTROABS  --  9.3*  --   --  5,605.00  --   HGB 10.2* 10.7* 10.6* 11.0* 13.5 12.6  HCT 32.2* 33.4* 32.7* 35* 41 38  MCV 86.3 86.5 84.7  --   --   --   PLT 308 319 312 353 282 193   Lab Results  Component Value Date   TSH 2.52 12/07/2020   Lab Results  Component Value Date   HGBA1C 6.0 05/30/2021   Lab Results  Component Value Date   CHOL 120 12/07/2020   HDL 47 12/07/2020   LDLCALC 50 12/07/2020   TRIG 148 12/07/2020    Significant Diagnostic Results in last 30 days:  No results found.  Assessment/Plan 1. Type 2 diabetes mellitus with peripheral vascular disease (HCC) - A1c 6.0 05/30/2021 - blood sugars 80-100's - will reduce Lantus to 2 units qhs, advised to hold if blood sugars < 110 - cont ACE, metformin, asa and statin - consider d/c Lantus if blood glucose and sugars remain < 150 - advised nursing to provide blood sugar report to PCP in one week   2. Frontotemporal dementia with behavioral disturbance (HCC) - improved behaviors with Seroquel scheduled at 4pm - followed by Palliative - cont Depakote, Seroquel and Ativan  3. Recurrent UTI - 04/26 urine culture > 100,000 cfu/mL of E.coli (ESBL) - resolved with Bactrim 800/160 mg po bid x 5 days - cont cranberry supplement  4. Essential hypertension - controlled - cont lisinopril  5. Mixed diabetic hyperlipidemia associated with type 2 diabetes mellitus (HCC) - LDL at goal < 70 - cont statin  6. Acquired hypothyroidism - TSH stable - cont levothyroxine  7. Hyponatremia - cont sodium tablets   8. Depression, recurrent (HCC) -no mood changes - followed by [facility] - cont Celexa and ativan  9.  Slow transit constipation - LBM 05/24 - cont colace    Family/ staff Communication: -Plan discussed with patient and nurse  Labs/tests ordered:  none

## 2021-07-14 DIAGNOSIS — R4789 Other speech disturbances: Secondary | ICD-10-CM | POA: Diagnosis not present

## 2021-07-14 DIAGNOSIS — F02818 Dementia in other diseases classified elsewhere, unspecified severity, with other behavioral disturbance: Secondary | ICD-10-CM | POA: Diagnosis not present

## 2021-07-15 DIAGNOSIS — F02818 Dementia in other diseases classified elsewhere, unspecified severity, with other behavioral disturbance: Secondary | ICD-10-CM | POA: Diagnosis not present

## 2021-07-15 DIAGNOSIS — R4789 Other speech disturbances: Secondary | ICD-10-CM | POA: Diagnosis not present

## 2021-07-18 DIAGNOSIS — F02818 Dementia in other diseases classified elsewhere, unspecified severity, with other behavioral disturbance: Secondary | ICD-10-CM | POA: Diagnosis not present

## 2021-07-18 DIAGNOSIS — R4789 Other speech disturbances: Secondary | ICD-10-CM | POA: Diagnosis not present

## 2021-07-19 DIAGNOSIS — R4789 Other speech disturbances: Secondary | ICD-10-CM | POA: Diagnosis not present

## 2021-07-19 DIAGNOSIS — F02818 Dementia in other diseases classified elsewhere, unspecified severity, with other behavioral disturbance: Secondary | ICD-10-CM | POA: Diagnosis not present

## 2021-07-28 ENCOUNTER — Encounter: Payer: Self-pay | Admitting: Internal Medicine

## 2021-07-28 ENCOUNTER — Non-Acute Institutional Stay (SKILLED_NURSING_FACILITY): Payer: Medicare Other | Admitting: Internal Medicine

## 2021-07-28 DIAGNOSIS — E782 Mixed hyperlipidemia: Secondary | ICD-10-CM | POA: Diagnosis not present

## 2021-07-28 DIAGNOSIS — E1169 Type 2 diabetes mellitus with other specified complication: Secondary | ICD-10-CM | POA: Diagnosis not present

## 2021-07-28 DIAGNOSIS — G3109 Other frontotemporal dementia: Secondary | ICD-10-CM

## 2021-07-28 DIAGNOSIS — E1151 Type 2 diabetes mellitus with diabetic peripheral angiopathy without gangrene: Secondary | ICD-10-CM | POA: Diagnosis not present

## 2021-07-28 DIAGNOSIS — F02818 Dementia in other diseases classified elsewhere, unspecified severity, with other behavioral disturbance: Secondary | ICD-10-CM

## 2021-07-28 NOTE — Progress Notes (Signed)
Location:   [facility] Room Number: 29 Place of Service:  SNF [phone]) Provider:  Einar Crow MD  Mahlon Gammon, MD  Patient Care Team: Mahlon Gammon, MD as PCP - General (Internal Medicine)  Extended Emergency Contact Information Primary Emergency Contact: [facility] Phone: [phone] Relation: Sister Secondary Emergency Contact: [facility] Phone: [phone] Mobile Phone: [phone] Relation: Brother  Code Status:  DNR Palliative Care Managed Care Goals of care: Advanced Directive information    07/28/2021   11:01 AM  Advanced Directives  Does Patient Have a Medical Advance Directive? Yes  Type of Estate agent of [place];Living will;Out of facility DNR (pink MOST or yellow form)  Does patient want to make changes to medical advance directive? No - Patient declined  Copy of Healthcare Power of Attorney in Chart? Yes - validated most recent copy scanned in chart (See row information)  Pre-existing out of facility DNR order (yellow form or pink MOST form) Yellow form placed in chart (order not valid for inpatient use)     Chief Complaint  Patient presents with   Acute Visit    HPI:  Pt is a 71 y.o. female seen today for an acute visit for Sleeping and Poor Appetite   Lives in SNF Patient was diagnosed with frontotemporal dementia in 2019.  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 She is retired Engineer, civil (consulting) Her sister is the POA  Patient has h/o screaming She scream non stop during day time Also Fake Sneezes She was started on low dose of Ativan but now she is sleeping more She is also on Depakote Not eating as she is sleeping most of the  time Her sister is concerned Patient was sleeping and not responding to me today She did eat her breakfast this morning She has aphasia. But no fever or cough Wt Readings from Last 3 Encounters:  07/28/21 160 lb 11.2 oz (72.9 kg)  07/06/21 164 lb 8 oz (74.6 kg)  06/10/21 160 lb 8 oz (72.8 kg)    Past Medical History:  Diagnosis Date   Dementia (HCC)    Diabetes mellitus without complication (HCC)    Hx of BKA, left (HCC) 10/24/2020   Thyroid disease    History reviewed. No pertinent surgical history.  No Known Allergies  Allergies as of 07/28/2021   No Known Allergies      Medication List        Accurate as of July 28, 2021 11:02 AM. If you have any questions, ask your nurse or doctor.          STOP taking these medications    Lantus SoloStar 100 UNIT/ML Solostar Pen Generic drug: insulin glargine Stopped by: Mahlon Gammon, MD       TAKE these medications    acetaminophen 500 MG tablet Commonly known as: TYLENOL Take 1,000 mg by mouth 2 (two) times daily as needed for fever (pain).   AMBULATORY NON FORMULARY MEDICATION [id number] [id number]  powder in packet; 100-7.5-2.691 gram; amt: 1 Capful (17 grams); oral  Special Instructions: Mix 1 capful (17 grams) in 4-8 oz of H2O and drink po daily  as needed for constipation Once A Day - PRN   aspirin 81 MG chewable tablet  Chew by mouth daily.   calcium carbonate 600 MG Tabs tablet Commonly known as: OS-CAL Take 600 mg by mouth daily with breakfast.   citalopram 10 MG tablet Commonly known as: CELEXA Take 10 mg by mouth every morning.   Cranberry 450 MG Tabs Take by mouth daily.   divalproex 125 MG capsule Commonly known as: DEPAKOTE SPRINKLE Take 125 mg by mouth 2 (two) times daily.   divalproex 250 MG DR tablet Commonly known as: DEPAKOTE Take 250 mg by mouth daily. In Afternoon   Docusate Sodium 100 MG capsule Take 100 mg by mouth 2 (two) times daily.   guaiFENesin 100 MG/5ML  liquid Commonly known as: ROBITUSSIN Take 200 mg by mouth every 4 (four) hours as needed for cough or congestion.   Healthy Eyes/Lutein-Zeaxanthin Caps Take 1 capsule by mouth every morning.   levothyroxine 50 MCG tablet Commonly known as: SYNTHROID Take 50 mcg by mouth daily before breakfast.   lisinopril 2.5 MG tablet Commonly known as: ZESTRIL Take 2.5 mg by mouth every morning.   loperamide 2 MG tablet Commonly known as: IMODIUM A-D Take 2 mg by mouth every 8 (eight) hours as needed for diarrhea or loose stools.   LORazepam 2 MG tablet Commonly known as: ATIVAN Take 0.5 mg by mouth daily as needed (If having behavior issues).   LORazepam 1 MG tablet Commonly known as: ATIVAN Take 1 tablet (1 mg total) by mouth 2 (two) times daily. Give at 7am and 4pm   melatonin 3 MG Tabs tablet Take 3 mg by mouth at bedtime.   metFORMIN 1000 MG tablet Commonly known as: GLUCOPHAGE Take 1,000 mg by mouth 2 (two) times daily.   Nyamyc powder Generic drug: nystatin Apply 1 application topically 2 (two) times daily as needed (groin rash).   PreviDent 5000 Booster Plus 1.1 % Pste Generic drug: Sodium Fluoride Place onto teeth daily. Brush on teeth with a toothbrush after PM mouth care. Spit out excess and do not rinse   QUEtiapine 50 MG tablet Commonly known as: SEROQUEL Take 50 mg by mouth daily. 25 mg in the am 50 mg in pm   QUEtiapine 25 MG tablet Commonly known as: SEROQUEL Take 25 mg by mouth in the morning.   Salonpas 3.02-19-08 % Ptch Generic drug: Camphor-Menthol-Methyl Sal Place 1 patch onto the skin See admin instructions. Apply one patch transdermally to left aka stump daily as needed for pain   simvastatin 20 MG tablet Commonly known as: ZOCOR Take 20 mg by mouth at bedtime.   sodium chloride 1 g tablet Take 1 g by mouth 2 (two) times daily.   thiamine 100 MG tablet Take 100 mg by mouth every morning. Vitamin B1   vitamin B-12 500 MCG tablet Commonly known  as: CYANOCOBALAMIN Take 500 mcg by mouth daily.   zinc oxide 20 % ointment Apply 1 application topically 3 (three) times daily as needed (apply to buttocks for redness).        Review of Systems  Unable to perform ROS: Dementia    Immunization History  Administered Date(s) Administered   Influenza-Unspecified 11/11/2019, 11/11/2020   Moderna SARS-COV2 Booster Vaccination 02/02/2021   Moderna Sars-Covid-2 Vaccination 03/06/2019, 04/03/2019, 12/18/2019, 11/11/2020   Pneumococcal Conjugate-13 12/09/2016   Pneumococcal Polysaccharide-23 12/09/2013   Tdap 03/12/2012   Zoster, Live 11/26/2012   Pertinent  Health Maintenance Due  Topic Date Due   INFLUENZA VACCINE  09/13/2021   HEMOGLOBIN A1C  11/29/2021   FOOT EXAM  03/09/2022  OPHTHALMOLOGY EXAM  06/02/2022   MAMMOGRAM  Discontinued   DEXA SCAN  Discontinued   COLONOSCOPY (Pts 45-28yrs Insurance coverage will need to be confirmed)  Discontinued      10/24/2020    8:00 PM 10/25/2020   11:00 AM 10/25/2020    7:10 PM 10/26/2020   12:00 PM 03/04/2021    2:48 PM  Fall Risk  Falls in the past year?     1  Was there an injury with Fall?     0  Fall Risk Category Calculator     1  Fall Risk Category     Low  Patient Fall Risk Level High fall risk High fall risk High fall risk High fall risk Moderate fall risk  Patient at Risk for Falls Due to     History of fall(s);Impaired balance/gait;Impaired mobility  Fall risk Follow up     Falls evaluation completed;Education provided;Falls prevention discussed   Functional Status Survey:    Vitals:   07/28/21 1038  BP: 110/60  Pulse: (!) 103  Resp: 20  Temp: (!) 97.4 F (36.3 C)  SpO2: 95%  Weight: 160 lb 11.2 oz (72.9 kg)  Height: 5\' 7"  (1.702 m)   Body mass index is 25.17 kg/m. Physical Exam Vitals reviewed.  Constitutional:      Appearance: Normal appearance.     Comments: Sleepy  HENT:     Head: Normocephalic.     Nose: Nose normal.     Mouth/Throat:     Mouth:  Mucous membranes are moist.     Pharynx: Oropharynx is clear.  Eyes:     Pupils: Pupils are equal, round, and reactive to light.  Cardiovascular:     Rate and Rhythm: Normal rate and regular rhythm.     Pulses: Normal pulses.     Heart sounds: Normal heart sounds. No murmur heard. Pulmonary:     Effort: Pulmonary effort is normal.     Breath sounds: Normal breath sounds.  Abdominal:     General: Abdomen is flat. Bowel sounds are normal.     Palpations: Abdomen is soft.  Musculoskeletal:        General: No swelling.     Cervical back: Neck supple.     Comments: S/P AKA  Skin:    General: Skin is warm.  Neurological:     General: No focal deficit present.  Psychiatric:        Mood and Affect: Mood normal.        Thought Content: Thought content normal.     Labs reviewed: Recent Labs    10/23/20 1813 10/24/20 0232 10/25/20 0453 12/07/20 0000 04/21/21 0000 06/02/21 0000  NA 145 140 134* 136* 139 140  K 4.3 3.9 4.3 4.7 4.7 4.6  CL 111 106 104 104 106 105  CO2 24 25 21* 24* 21 28*  GLUCOSE 123* 131* 109*  --   --   --   BUN 28* 26* 18 15 30* 24*  CREATININE 0.62 0.70 0.67 0.7 0.7 0.6  CALCIUM 10.3 9.6 8.4* 9.7 10.0 9.1  MG  --  1.6* 1.9  --   --   --    Recent Labs    10/23/20 1813 10/24/20 0232 12/07/20 0000 04/21/21 0000 06/02/21 0000  AST 17 13* 12* 14 11*  ALT 16 16 8 11 9   ALKPHOS 55 56 51 49 40  BILITOT 0.7 0.6  --   --   --   PROT 7.0 6.7  --   --   --  ALBUMIN 3.1* 3.0* 3.5 4.0 3.8   Recent Labs    10/23/20 1813 10/24/20 0232 10/25/20 0453 12/07/20 0000 04/21/21 0000 06/02/21 0000  WBC 12.5* 12.1* 10.0 10.0 8.1 7.1  NEUTROABS  --  9.3*  --   --  5,605.00  --   HGB 10.2* 10.7* 10.6* 11.0* 13.5 12.6  HCT 32.2* 33.4* 32.7* 35* 41 38  MCV 86.3 86.5 84.7  --   --   --   PLT 308 319 312 353 282 193   Lab Results  Component Value Date   TSH 2.52 12/07/2020   Lab Results  Component Value Date   HGBA1C 6.0 05/30/2021   Lab Results   Component Value Date   CHOL 120 12/07/2020   HDL 47 12/07/2020   LDLCALC 50 12/07/2020   TRIG 148 12/07/2020    Significant Diagnostic Results in last 30 days:  No results found.  Assessment/Plan 1. Frontotemporal dementia with behavioral disturbance (HCC) Now more Sleepy Will change her Ativan to 0.5 PO BID Continue on Seroquel and Depakote and Celexa Also Repeat CBC and CMP  2. 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  Markedly severe cerebral atrophy most significant temporal parietal lobes. Clinically correlate for Alzheimer's   Sister does not want more Work up [employer] aspirin Already on statin Hyperlipidemia associated with type 2 diabetes mellitus (HCC) On Statin Last LDL 50 in 10/22    Acquired hypothyroidism TSH normal in 10/22    Hyponatremia On Sodium tabs H/o Psychogenic Polydipsia    Depression, recurrent (HCC) On Celexa  Family/ staff Communication:   Labs/tests ordered:

## 2021-07-29 ENCOUNTER — Encounter: Payer: Self-pay | Admitting: Internal Medicine

## 2021-08-01 DIAGNOSIS — I10 Essential (primary) hypertension: Secondary | ICD-10-CM | POA: Diagnosis not present

## 2021-08-01 DIAGNOSIS — D649 Anemia, unspecified: Secondary | ICD-10-CM | POA: Diagnosis not present

## 2021-08-01 LAB — BASIC METABOLIC PANEL
BUN: 25 — AB (ref 4–21)
CO2: 25 — AB (ref 13–22)
Chloride: 108 (ref 99–108)
Creatinine: 0.6 (ref 0.5–1.1)
Glucose: 93
Potassium: 4.4 mEq/L (ref 3.5–5.1)
Sodium: 142 (ref 137–147)

## 2021-08-01 LAB — CBC AND DIFFERENTIAL
HCT: 41 (ref 36–46)
Hemoglobin: 13.6 (ref 12.0–16.0)
Neutrophils Absolute: 4797
Platelets: 214 10*3/uL (ref 150–400)
WBC: 7.7

## 2021-08-01 LAB — COMPREHENSIVE METABOLIC PANEL
Calcium: 9.3 (ref 8.7–10.7)
eGFR: 97

## 2021-08-01 LAB — CBC: RBC: 4.68 (ref 3.87–5.11)

## 2021-08-04 DIAGNOSIS — Z89612 Acquired absence of left leg above knee: Secondary | ICD-10-CM | POA: Diagnosis not present

## 2021-08-04 DIAGNOSIS — R279 Unspecified lack of coordination: Secondary | ICD-10-CM | POA: Diagnosis not present

## 2021-08-04 DIAGNOSIS — M6281 Muscle weakness (generalized): Secondary | ICD-10-CM | POA: Diagnosis not present

## 2021-08-05 ENCOUNTER — Non-Acute Institutional Stay (SKILLED_NURSING_FACILITY): Payer: Medicare Other | Admitting: Internal Medicine

## 2021-08-05 DIAGNOSIS — E1151 Type 2 diabetes mellitus with diabetic peripheral angiopathy without gangrene: Secondary | ICD-10-CM

## 2021-08-05 DIAGNOSIS — F02818 Dementia in other diseases classified elsewhere, unspecified severity, with other behavioral disturbance: Secondary | ICD-10-CM

## 2021-08-05 DIAGNOSIS — G4710 Hypersomnia, unspecified: Secondary | ICD-10-CM

## 2021-08-05 DIAGNOSIS — G3109 Other frontotemporal dementia: Secondary | ICD-10-CM

## 2021-08-07 ENCOUNTER — Encounter: Payer: Self-pay | Admitting: Internal Medicine

## 2021-08-15 ENCOUNTER — Non-Acute Institutional Stay: Payer: Medicare Other | Admitting: *Deleted

## 2021-08-15 DIAGNOSIS — Z89612 Acquired absence of left leg above knee: Secondary | ICD-10-CM | POA: Diagnosis not present

## 2021-08-15 DIAGNOSIS — Z515 Encounter for palliative care: Secondary | ICD-10-CM

## 2021-08-15 DIAGNOSIS — M6281 Muscle weakness (generalized): Secondary | ICD-10-CM | POA: Diagnosis not present

## 2021-08-15 DIAGNOSIS — R279 Unspecified lack of coordination: Secondary | ICD-10-CM | POA: Diagnosis not present

## 2021-08-15 NOTE — Progress Notes (Signed)
[facility] PALLIATIVE CARE RN NOTE  PATIENT NAME: Alexis Becker DOB: 1950-07-18 MRN: [id number]  PRIMARY CARE PROVIDER: Virgie Dad, MD  RESPONSIBLE PARTY: Norva Riffle (sister) Acct ID - Guarantor Home Phone Work Phone Relationship Acct Type  [id number] Alexis Becker, Alexis Becker [id number]  Self P/F     [address], [place], [place] [address]   RN follow up palliative care visit completed today at [facility]. Met with patient in her room.  Pain: Patient denies pain and no physical indicators of pain noted.  Cognitive: Patient has declined cognitively. She is less communicative, but will answer occasionally with a yes or no when question is asked. Behaviors have improved and less "fake sneezing" noted by staff. She is currently on Depakote and Seroquel which appears to be effective in managing behaviors. She also has PRN Ativan if needed.  Mobility: She is transferred with 1-2 person assistance to her wheelchair daily. She is non-ambulatory. She it total care for all ADLs including feeding.   Appetite: Her intake has declined some overall. There are times she will barely open her mouth to eat. She has Boost ordered if she eats less than 50% of her meals. She has lost 11 lbs over the past 6 months. Current weight is 156.9 lbs. Also at times she will not open her mouth to take her medications. It is given crushed in applesauce.  Palliative care team will continue to follow.  PHYSICAL EXAM:   LUNGS: clear to auscultation  CARDIAC: Cor RRR EXTREMITIES: No edema, L BKA SKIN:  No skin issues at this time   NEURO:  Alert to self, non-ambulatory, very minimal communication   (Duration of visit and documentation 30 minutes)   Daryl Eastern, RN BSN

## 2021-08-22 DIAGNOSIS — Z89612 Acquired absence of left leg above knee: Secondary | ICD-10-CM | POA: Diagnosis not present

## 2021-08-23 ENCOUNTER — Other Ambulatory Visit: Payer: Self-pay | Admitting: Adult Health

## 2021-08-23 DIAGNOSIS — F02818 Dementia in other diseases classified elsewhere, unspecified severity, with other behavioral disturbance: Secondary | ICD-10-CM

## 2021-08-23 MED ORDER — LORAZEPAM 0.5 MG PO TABS: 0.5000 mg | ORAL_TABLET | Freq: Two times a day (BID) | ORAL | 0 refills | Status: DC | PRN

## 2021-08-24 ENCOUNTER — Encounter: Payer: Self-pay | Admitting: Orthopedic Surgery

## 2021-08-24 ENCOUNTER — Non-Acute Institutional Stay (SKILLED_NURSING_FACILITY): Payer: Medicare Other | Admitting: Orthopedic Surgery

## 2021-08-24 DIAGNOSIS — Z89612 Acquired absence of left leg above knee: Secondary | ICD-10-CM | POA: Diagnosis not present

## 2021-08-24 DIAGNOSIS — R634 Abnormal weight loss: Secondary | ICD-10-CM | POA: Diagnosis not present

## 2021-08-24 DIAGNOSIS — E871 Hypo-osmolality and hyponatremia: Secondary | ICD-10-CM | POA: Diagnosis not present

## 2021-08-24 DIAGNOSIS — F424 Excoriation (skin-picking) disorder: Secondary | ICD-10-CM

## 2021-08-24 DIAGNOSIS — N390 Urinary tract infection, site not specified: Secondary | ICD-10-CM

## 2021-08-24 DIAGNOSIS — E1151 Type 2 diabetes mellitus with diabetic peripheral angiopathy without gangrene: Secondary | ICD-10-CM

## 2021-08-24 DIAGNOSIS — F339 Major depressive disorder, recurrent, unspecified: Secondary | ICD-10-CM

## 2021-08-24 DIAGNOSIS — I10 Essential (primary) hypertension: Secondary | ICD-10-CM | POA: Diagnosis not present

## 2021-08-24 DIAGNOSIS — E039 Hypothyroidism, unspecified: Secondary | ICD-10-CM

## 2021-08-24 DIAGNOSIS — K5901 Slow transit constipation: Secondary | ICD-10-CM

## 2021-08-24 DIAGNOSIS — E1169 Type 2 diabetes mellitus with other specified complication: Secondary | ICD-10-CM | POA: Diagnosis not present

## 2021-08-24 DIAGNOSIS — G3109 Other frontotemporal dementia: Secondary | ICD-10-CM | POA: Diagnosis not present

## 2021-08-24 DIAGNOSIS — F02818 Dementia in other diseases classified elsewhere, unspecified severity, with other behavioral disturbance: Secondary | ICD-10-CM

## 2021-08-24 DIAGNOSIS — E782 Mixed hyperlipidemia: Secondary | ICD-10-CM

## 2021-08-24 MED ORDER — HYDROCORTISONE 1 % EX CREA: 1.0000 | TOPICAL_CREAM | Freq: Two times a day (BID) | CUTANEOUS | 0 refills | Status: DC

## 2021-08-24 NOTE — Progress Notes (Signed)
Location:   [facility] Room Number: [id number] Place of Service:  SNF ([phone]) Provider:  Octavia Heir, NP   Mahlon Gammon, MD  Patient Care Team: Mahlon Gammon, MD as PCP - General (Internal Medicine)  Extended Emergency Contact Information Primary Emergency Contact: [facility] Phone: [phone] Relation: Sister Secondary Emergency Contact: [facility] Phone: [phone] Mobile Phone: [phone] Relation: Brother  Code Status:  DNR Palliative Care Managed Care Goals of care: Advanced Directive information    08/24/2021   10:39 AM  Advanced Directives  Does Patient Have a Medical Advance Directive? Yes  Type of Estate agent of [place];Living will;Out of facility DNR (pink MOST or yellow form)  Does patient want to make changes to medical advance directive? No - Patient declined  Copy of Healthcare Power of Attorney in Chart? Yes - validated most recent copy scanned in chart (See row information)  Pre-existing out of facility DNR order (yellow form or pink MOST form) Yellow form placed in chart (order not valid for inpatient use)     Chief Complaint  Patient presents with   Medical Management of Chronic Issues   Quality Metric Gaps    Verified with [facility] and matrix patient is due for: Hepatitis C Screening (Once)   Zoster Vaccines- Shingrix (1 of 2) Pneumonia Vaccine 71+ Years old (3 - PPSV23 or PCV20) COVID-19 Vaccine      HPI:  Pt is a 71 y.o. female seen today for medical management of chronic diseases.    She currently resides on the skilled nursing unit at [facility] due to frontotemporal dementia. Past medical history includes: HTN, hypothyroidism, T2DM, alcohol abuse, psychosis,  wernicke-korsakoff syndrome, thrombocytopenia.    Frontotemporal dementia- followed by palliative,MRI 2019 remote infarcts right anterior temporal lobe and right periventricular basal ganglia, marked severe cerebral  atrophy temporal parietal lobes, dependent with all ADLs including feeding, has tics of yelling and fake sneezing, 06/23 excessive sleepiness- labs unremarkable, ativan reduced to prn, remains on Seroquel and Depakote T2DM- A1c 6.0 05/30/2021, this weeks sugar 119- per RN, no recent hypoglycemia, Metformin recently discontinued, remains on Lantus,ace, asa and statin Recurrent UTI- H/o ESBL E.coli, 04/26 urine culture > 100,000 cfu/mL of E.coli (ESBL) resolved with Bactrim, remains on cranberry supplement for prevention HTN- BUN/creat 25/0.61 08/01/2021, remains on lisinopril HLD- LDL 50 12/07/2020, remains on statin Hypothyroidism- TSH 2.52 12/07/2020, remains on levothyroxine Weight loss- see trends below Hyponatremia- Na 142 08/01/2021, remains on sodium tablets Depression- followed by [facility], remains on Celexa and ativan prn Constipation- LBM 07/11, remains on colace  No recent falls or injuries.   Recent blood pressures:  07/04- 111/78  06/27- 110/60  06/20- 130/92  Recent weighs:  07/03- 156.9 lbs  06/01- 160.7 lbs  05/01- 164.5 lbs     Past Medical History:  Diagnosis Date   Dementia (HCC)    Diabetes mellitus without complication (HCC)    Hx of BKA, left (HCC) 10/24/2020   Thyroid disease    History reviewed. No pertinent surgical history.  No Known Allergies  Allergies as of 08/24/2021   No Known Allergies      Medication List        Accurate as of August 24, 2021 10:39 AM. If you have any questions, ask your nurse or doctor.          acetaminophen 500 MG tablet Commonly known as: TYLENOL Take 1,000 mg by mouth 2 (two) times daily as needed for fever (  pain).   AMBULATORY NON FORMULARY MEDICATION peg3350-sod sul-NaCl-KCl-asb-C  powder in packet; 100-7.5-2.691 gram; amt: 1 Capful (17 grams); oral  Special Instructions: Mix 1 capful (17 grams) in 4-8 oz of H2O and drink po daily  as needed for constipation Once A Day - PRN   aspirin 81 MG  chewable tablet Chew by mouth daily.   calcium carbonate 600 MG Tabs tablet Commonly known as: OS-CAL Take 600 mg by mouth daily with breakfast.   citalopram 10 MG tablet Commonly known as: CELEXA Take 10 mg by mouth every morning.   Cranberry 450 MG Tabs Take by mouth daily.   divalproex 125 MG capsule Commonly known as: DEPAKOTE SPRINKLE Take 125 mg by mouth 2 (two) times daily.   divalproex 250 MG DR tablet Commonly known as: DEPAKOTE Take 250 mg by mouth daily. In Afternoon   Docusate Sodium 100 MG capsule Take 100 mg by mouth 2 (two) times daily.   guaiFENesin 100 MG/5ML liquid Commonly known as: ROBITUSSIN Take 200 mg by mouth every 4 (four) hours as needed for cough or congestion.   Healthy Eyes/Lutein-Zeaxanthin Caps Take 1 capsule by mouth every morning.   levothyroxine 50 MCG tablet Commonly known as: SYNTHROID Take 50 mcg by mouth daily before breakfast.   lisinopril 2.5 MG tablet Commonly known as: ZESTRIL Take 2.5 mg by mouth every morning.   loperamide 2 MG tablet Commonly known as: IMODIUM A-D Take 2 mg by mouth every 8 (eight) hours as needed for diarrhea or loose stools.   LORazepam 2 MG tablet Commonly known as: ATIVAN Take 2 mg by mouth. Take 1 tablet as directed by [facility] staff   LORazepam 0.5 MG tablet Commonly known as: Ativan Take 1 tablet (0.5 mg total) by mouth 2 (two) times daily as needed for anxiety.   LORazepam 2 MG tablet Commonly known as: ATIVAN Take 2 mg by mouth once. Start taking on: August 26, 2021   melatonin 3 MG Tabs tablet Take 3 mg by mouth at bedtime.   Nyamyc powder Generic drug: nystatin Apply 1 application topically 2 (two) times daily as needed (groin rash).   PreviDent 5000 Booster Plus 1.1 % Pste Generic drug: Sodium Fluoride Place onto teeth daily. Brush on teeth with a toothbrush after PM mouth care. Spit out excess and do not rinse   QUEtiapine 50 MG tablet Commonly known as:  SEROQUEL Take 50 mg by mouth daily. 25 mg in the am 50 mg in pm   Salonpas 3.02-19-08 % Ptch Generic drug: Camphor-Menthol-Methyl Sal Place 1 patch onto the skin See admin instructions. Apply one patch transdermally to left aka stump daily as needed for pain   simvastatin 20 MG tablet Commonly known as: ZOCOR Take 20 mg by mouth at bedtime.   sodium chloride 1 g tablet Take 1 g by mouth 2 (two) times daily.   thiamine 100 MG tablet Take 100 mg by mouth every morning. Vitamin B1   vitamin B-12 500 MCG tablet Commonly known as: CYANOCOBALAMIN Take 500 mcg by mouth daily.   zinc oxide 20 % ointment Apply 1 application topically 3 (three) times daily as needed (apply to buttocks for redness).        Review of Systems  Unable to perform ROS: Dementia    Immunization History  Administered Date(s) Administered   Influenza-Unspecified 11/11/2019, 11/11/2020   Moderna SARS-COV2 Booster Vaccination 02/02/2021   Moderna Sars-Covid-2 Vaccination 03/06/2019, 04/03/2019, 12/18/2019, 11/11/2020   Pneumococcal Conjugate-13 12/09/2016   Pneumococcal  Polysaccharide-23 12/09/2013   Tdap 03/12/2012   Zoster, Live 11/26/2012   Zoster, Unspecified 11/26/2012   Pertinent  Health Maintenance Due  Topic Date Due   INFLUENZA VACCINE  09/13/2021   HEMOGLOBIN A1C  11/29/2021   FOOT EXAM  03/09/2022   OPHTHALMOLOGY EXAM  06/02/2022   MAMMOGRAM  Discontinued   DEXA SCAN  Discontinued   COLONOSCOPY (Pts 45-54yrs Insurance coverage will need to be confirmed)  Discontinued      10/24/2020    8:00 PM 10/25/2020   11:00 AM 10/25/2020    7:10 PM 10/26/2020   12:00 PM 03/04/2021    2:48 PM  Fall Risk  Falls in the past year?     1  Was there an injury with Fall?     0  Fall Risk Category Calculator     1  Fall Risk Category     Low  Patient Fall Risk Level High fall risk High fall risk High fall risk High fall risk Moderate fall risk  Patient at Risk for Falls Due to     History of  fall(s);Impaired balance/gait;Impaired mobility  Fall risk Follow up     Falls evaluation completed;Education provided;Falls prevention discussed   Functional Status Survey:    Vitals:   08/24/21 1026  BP: 111/78  Pulse: 62  Resp: 20  Temp: (!) 96.4 F (35.8 C)  SpO2: 98%  Weight: 156 lb 14.4 oz (71.2 kg)  Height: 5\' 7"  (1.702 m)   Body mass index is 24.57 kg/m. Physical Exam Constitutional:      General: She is not in acute distress. HENT:     Head: Normocephalic.     Right Ear: There is no impacted cerumen.     Left Ear: There is no impacted cerumen.     Nose: Nose normal.     Mouth/Throat:     Mouth: Mucous membranes are moist.  Eyes:     General:        Right eye: No discharge.        Left eye: No discharge.  Cardiovascular:     Rate and Rhythm: Normal rate and regular rhythm.     Pulses: Normal pulses.     Heart sounds: Normal heart sounds.  Pulmonary:     Effort: Pulmonary effort is normal. No respiratory distress.     Breath sounds: Normal breath sounds. No wheezing.  Abdominal:     General: Bowel sounds are normal. There is no distension.     Palpations: Abdomen is soft.     Tenderness: There is no abdominal tenderness.  Musculoskeletal:     Cervical back: Neck supple.     Right lower leg: No edema.     Left lower leg: No edema.     Comments: L BKA  Skin:    General: Skin is warm and dry.     Capillary Refill: Capillary refill takes less than 2 seconds.     Comments: Small areas of dryness, scabs to forehead, neck/jawline, < 1 cm, no sign of infection  Neurological:     General: No focal deficit present.     Mental Status: She is alert. Mental status is at baseline.     Motor: Weakness present.     Gait: Gait abnormal.     Comments: Hoyer,wheelchair  Psychiatric:        Mood and Affect: Mood normal.        Behavior: Behavior normal.     Comments: Nonverbal, does not follow  commands, alert to self     Labs reviewed: Recent Labs     10/23/20 1813 10/24/20 0232 10/25/20 0453 12/07/20 0000 04/21/21 0000 06/02/21 0000  NA 145 140 134* 136* 139 140  K 4.3 3.9 4.3 4.7 4.7 4.6  CL 111 106 104 104 106 105  CO2 24 25 21* 24* 21 28*  GLUCOSE 123* 131* 109*  --   --   --   BUN 28* 26* 18 15 30* 24*  CREATININE 0.62 0.70 0.67 0.7 0.7 0.6  CALCIUM 10.3 9.6 8.4* 9.7 10.0 9.1  MG  --  1.6* 1.9  --   --   --    Recent Labs    10/23/20 1813 10/24/20 0232 12/07/20 0000 04/21/21 0000 06/02/21 0000  AST 17 13* 12* 14 11*  ALT 16 16 8 11 9   ALKPHOS 55 56 51 49 40  BILITOT 0.7 0.6  --   --   --   PROT 7.0 6.7  --   --   --   ALBUMIN 3.1* 3.0* 3.5 4.0 3.8   Recent Labs    10/23/20 1813 10/24/20 0232 10/25/20 0453 12/07/20 0000 04/21/21 0000 06/02/21 0000  WBC 12.5* 12.1* 10.0 10.0 8.1 7.1  NEUTROABS  --  9.3*  --   --  5,605.00  --   HGB 10.2* 10.7* 10.6* 11.0* 13.5 12.6  HCT 32.2* 33.4* 32.7* 35* 41 38  MCV 86.3 86.5 84.7  --   --   --   PLT 308 319 312 353 282 193   Lab Results  Component Value Date   TSH 2.52 12/07/2020   Lab Results  Component Value Date   HGBA1C 6.0 05/30/2021   Lab Results  Component Value Date   CHOL 120 12/07/2020   HDL 47 12/07/2020   LDLCALC 50 12/07/2020   TRIG 148 12/07/2020    Significant Diagnostic Results in last 30 days:  No results found.  Assessment/Plan 1. Frontotemporal dementia with behavioral disturbance (HCC) - 06/23 excessive sleepiness- ativan reduced to prn - followed by palliative - dependent with all ADLs including feedings - ambulates with wheelchair - cont Seroquel and Depakote - cont skilled nursing care  2. Type 2 diabetes mellitus with peripheral vascular disease (HCC) - A1c stable - metformin discontinued last month - cont weekly CBGs - cont Lantus, asa, ACE  3. Recurrent UTI - past h/o ESBL - cont cranberry supplement  4. Essential hypertension - controlled  - cont lisinopril  5. Mixed diabetic hyperlipidemia associated with  type 2 diabetes mellitus (HCC) - LDL at goal < 70 - cont statin  6. Acquired hypothyroidism - TSH stable - cont levothyroxine  7. Weight loss - down 8 lbs in 2 months - cont monthly weights  8. Hyponatremia - Na stable  9. Depression, recurrent (HCC) - followed by [facility] - cont Celexa and Ativan prn  10. Slow transit constipation - LBM 07/11 - cont colace  11. Skin picking habit - small areas of dryness, scabs to face/neck/jaw line- no infection - start hydrocortisone bid x 1 week, then bid prn  12. S/P AKA (above knee amputation) left (HCC) - cont skilled nursing care   Family/ staff Communication: plan discussed with patient and nurse  Labs/tests ordered: none

## 2021-08-25 DIAGNOSIS — R1310 Dysphagia, unspecified: Secondary | ICD-10-CM | POA: Diagnosis not present

## 2021-08-25 DIAGNOSIS — R279 Unspecified lack of coordination: Secondary | ICD-10-CM | POA: Diagnosis not present

## 2021-08-25 DIAGNOSIS — R1312 Dysphagia, oropharyngeal phase: Secondary | ICD-10-CM | POA: Diagnosis not present

## 2021-08-25 DIAGNOSIS — M6281 Muscle weakness (generalized): Secondary | ICD-10-CM | POA: Diagnosis not present

## 2021-08-26 DIAGNOSIS — Z89612 Acquired absence of left leg above knee: Secondary | ICD-10-CM | POA: Diagnosis not present

## 2021-08-29 DIAGNOSIS — R1310 Dysphagia, unspecified: Secondary | ICD-10-CM | POA: Diagnosis not present

## 2021-08-29 DIAGNOSIS — R1312 Dysphagia, oropharyngeal phase: Secondary | ICD-10-CM | POA: Diagnosis not present

## 2021-08-31 DIAGNOSIS — R1312 Dysphagia, oropharyngeal phase: Secondary | ICD-10-CM | POA: Diagnosis not present

## 2021-08-31 DIAGNOSIS — R1310 Dysphagia, unspecified: Secondary | ICD-10-CM | POA: Diagnosis not present

## 2021-09-01 DIAGNOSIS — R1310 Dysphagia, unspecified: Secondary | ICD-10-CM | POA: Diagnosis not present

## 2021-09-01 DIAGNOSIS — R1312 Dysphagia, oropharyngeal phase: Secondary | ICD-10-CM | POA: Diagnosis not present

## 2021-09-02 DIAGNOSIS — Z89612 Acquired absence of left leg above knee: Secondary | ICD-10-CM | POA: Diagnosis not present

## 2021-09-05 DIAGNOSIS — Z89612 Acquired absence of left leg above knee: Secondary | ICD-10-CM | POA: Diagnosis not present

## 2021-09-05 DIAGNOSIS — R1312 Dysphagia, oropharyngeal phase: Secondary | ICD-10-CM | POA: Diagnosis not present

## 2021-09-05 DIAGNOSIS — R1310 Dysphagia, unspecified: Secondary | ICD-10-CM | POA: Diagnosis not present

## 2021-09-07 DIAGNOSIS — R1310 Dysphagia, unspecified: Secondary | ICD-10-CM | POA: Diagnosis not present

## 2021-09-07 DIAGNOSIS — M6281 Muscle weakness (generalized): Secondary | ICD-10-CM | POA: Diagnosis not present

## 2021-09-07 DIAGNOSIS — R279 Unspecified lack of coordination: Secondary | ICD-10-CM | POA: Diagnosis not present

## 2021-09-07 DIAGNOSIS — R1312 Dysphagia, oropharyngeal phase: Secondary | ICD-10-CM | POA: Diagnosis not present

## 2021-09-08 ENCOUNTER — Telehealth: Payer: Self-pay | Admitting: Internal Medicine

## 2021-09-08 DIAGNOSIS — R1312 Dysphagia, oropharyngeal phase: Secondary | ICD-10-CM | POA: Diagnosis not present

## 2021-09-08 DIAGNOSIS — R1310 Dysphagia, unspecified: Secondary | ICD-10-CM | POA: Diagnosis not present

## 2021-09-08 NOTE — Telephone Encounter (Signed)
Facility Nurse called Patient continues to scream loudly and fake sneeze Got Ativan and still doing it Disturbing other residents Change her Depakote to 250 mg TID

## 2021-09-09 DIAGNOSIS — R1312 Dysphagia, oropharyngeal phase: Secondary | ICD-10-CM | POA: Diagnosis not present

## 2021-09-09 DIAGNOSIS — M6281 Muscle weakness (generalized): Secondary | ICD-10-CM | POA: Diagnosis not present

## 2021-09-09 DIAGNOSIS — R279 Unspecified lack of coordination: Secondary | ICD-10-CM | POA: Diagnosis not present

## 2021-09-09 DIAGNOSIS — Z89612 Acquired absence of left leg above knee: Secondary | ICD-10-CM | POA: Diagnosis not present

## 2021-09-09 DIAGNOSIS — R1310 Dysphagia, unspecified: Secondary | ICD-10-CM | POA: Diagnosis not present

## 2021-09-14 DIAGNOSIS — R1312 Dysphagia, oropharyngeal phase: Secondary | ICD-10-CM | POA: Diagnosis not present

## 2021-09-14 DIAGNOSIS — M6281 Muscle weakness (generalized): Secondary | ICD-10-CM | POA: Diagnosis not present

## 2021-09-14 DIAGNOSIS — R1310 Dysphagia, unspecified: Secondary | ICD-10-CM | POA: Diagnosis not present

## 2021-09-14 DIAGNOSIS — R279 Unspecified lack of coordination: Secondary | ICD-10-CM | POA: Diagnosis not present

## 2021-09-15 ENCOUNTER — Non-Acute Institutional Stay (SKILLED_NURSING_FACILITY): Payer: Medicare Other | Admitting: Internal Medicine

## 2021-09-15 DIAGNOSIS — I10 Essential (primary) hypertension: Secondary | ICD-10-CM | POA: Diagnosis not present

## 2021-09-15 DIAGNOSIS — E1151 Type 2 diabetes mellitus with diabetic peripheral angiopathy without gangrene: Secondary | ICD-10-CM | POA: Diagnosis not present

## 2021-09-15 DIAGNOSIS — F02818 Dementia in other diseases classified elsewhere, unspecified severity, with other behavioral disturbance: Secondary | ICD-10-CM

## 2021-09-15 DIAGNOSIS — E782 Mixed hyperlipidemia: Secondary | ICD-10-CM

## 2021-09-15 DIAGNOSIS — G3109 Other frontotemporal dementia: Secondary | ICD-10-CM

## 2021-09-15 DIAGNOSIS — E1169 Type 2 diabetes mellitus with other specified complication: Secondary | ICD-10-CM

## 2021-09-15 DIAGNOSIS — E039 Hypothyroidism, unspecified: Secondary | ICD-10-CM | POA: Diagnosis not present

## 2021-09-16 DIAGNOSIS — R279 Unspecified lack of coordination: Secondary | ICD-10-CM | POA: Diagnosis not present

## 2021-09-16 DIAGNOSIS — M6281 Muscle weakness (generalized): Secondary | ICD-10-CM | POA: Diagnosis not present

## 2021-09-16 DIAGNOSIS — Z89612 Acquired absence of left leg above knee: Secondary | ICD-10-CM | POA: Diagnosis not present

## 2021-09-18 ENCOUNTER — Encounter: Payer: Self-pay | Admitting: Internal Medicine

## 2021-09-18 NOTE — Progress Notes (Signed)
[facility] Service:  SNF (31)  Provider:   Code Status: DNR Goals of Care:     08/24/2021   10:39 AM  Advanced Directives  Does Patient Have a Medical Advance Directive? Yes  Type of Estate agent of [place];Living will;Out of facility DNR (pink MOST or yellow form)  Does patient want to make changes to medical advance directive? No - Patient declined  Copy of Healthcare Power of Attorney in Chart? Yes - validated most recent copy scanned in chart (See row information)  Pre-existing out of facility DNR order (yellow form or pink MOST form) Yellow form placed in chart (order not valid for inpatient use)     Chief Complaint  Patient presents with   Acute Visit    HPI: Patient is a 71 y.o. female seen today for an acute visit for Behaviors  Lives in SNF   Patient was diagnosed with frontotemporal dementia in 2019.  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 She is retired Engineer, civil (consulting) Her sister is the POA  Patient continues to have issues with screaming and fake sneezing When you go to her room she stops and that she sets again.  The resident in other rooms have been complaining about her. I had tapered her off the morning Seroquel after she was started on Depakote. As she was getting sleepy. 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  No Known Allergies  Outpatient Encounter Medications as of  09/15/2021  Medication Sig   acetaminophen (TYLENOL) 500 MG tablet Take 1,000 mg by mouth 2 (two) times daily as needed for fever (pain).   AMBULATORY NON FORMULARY MEDICATION peg3350-sod sul-NaCl-KCl-asb-C  powder in packet; 100-7.5-2.691 gram; amt: 1 Capful (17 grams); oral  Special Instructions: Mix 1 capful (17 grams) in 4-8 oz of H2O and drink po daily  as needed for constipation Once A Day - PRN   aspirin 81 MG chewable tablet Chew by mouth daily.   calcium carbonate (OS-CAL) 600 MG TABS tablet Take 600 mg by mouth daily with breakfast.   Camphor-Menthol-Methyl Sal (SALONPAS) 3.02-19-08 % PTCH Place 1 patch onto the skin See admin instructions. Apply one patch transdermally to left aka stump daily as needed for pain   citalopram (CELEXA) 10 MG tablet Take 10 mg by mouth every morning.   Cranberry 450 MG TABS Take by mouth daily.   divalproex (DEPAKOTE) 250 MG DR tablet Take 250 mg by mouth 3 (three) times daily.   Docusate Sodium 100 MG capsule Take 100 mg by mouth 2 (two) times daily.   guaiFENesin (ROBITUSSIN) 100 MG/5ML liquid Take 200 mg by mouth every 4 (four) hours as needed for cough or congestion.   hydrocortisone cream 1 % Apply 1 Application topically 2 (two) times daily.   levothyroxine (SYNTHROID) 50 MCG tablet Take 50 mcg by mouth daily before breakfast.   lisinopril (ZESTRIL) 2.5 MG tablet  Take 2.5 mg by mouth every morning.   loperamide (IMODIUM A-D) 2 MG tablet Take 2 mg by mouth every 8 (eight) hours as needed for diarrhea or loose stools.   LORazepam (ATIVAN) 0.5 MG tablet Take 1 tablet (0.5 mg total) by mouth 2 (two) times daily as needed for anxiety. (Patient taking differently: Take 0.5 mg by mouth every 4 (four) hours as needed for anxiety.)   LORazepam (ATIVAN) 2 MG tablet Take 2 mg by mouth. Take 1 tablet as directed by [facility] staff   melatonin 3 MG TABS tablet Take 3 mg by mouth at bedtime.   Multiple Vitamins-Minerals (HEALTHY EYES/LUTEIN-ZEAXANTHIN)  CAPS Take 1 capsule by mouth every morning.   NYAMYC powder Apply 1 application topically 2 (two) times daily as needed (groin rash).   QUEtiapine (SEROQUEL) 50 MG tablet Take 50 mg by mouth daily. 50 mg in pm   simvastatin (ZOCOR) 20 MG tablet Take 20 mg by mouth at bedtime.   sodium chloride 1 g tablet Take 1 g by mouth 2 (two) times daily.   Sodium Fluoride (PREVIDENT 5000 BOOSTER PLUS) 1.1 % PSTE Place onto teeth daily. Brush on teeth with a toothbrush after PM mouth care. Spit out excess and do not rinse   thiamine 100 MG tablet Take 100 mg by mouth every morning. Vitamin B1   vitamin B-12 (CYANOCOBALAMIN) 500 MCG tablet Take 500 mcg by mouth daily.   zinc oxide 20 % ointment Apply 1 application topically 3 (three) times daily as needed (apply to buttocks for redness).   [DISCONTINUED] divalproex (DEPAKOTE SPRINKLE) 125 MG capsule Take 125 mg by mouth 2 (two) times daily.   No facility-administered encounter medications on file as of 09/15/2021.    Review of Systems:  Review of Systems  Unable to perform ROS: Dementia    Health Maintenance  Topic Date Due   Hepatitis C Screening  Never done   Zoster Vaccines- Shingrix (1 of 2) 11/26/1970   Pneumonia Vaccine 57+ Years old (2 - PCV) 11/25/2017   INFLUENZA VACCINE  09/13/2021   HEMOGLOBIN A1C  11/29/2021   FOOT EXAM  03/09/2022   TETANUS/TDAP  03/12/2022   OPHTHALMOLOGY EXAM  06/02/2022   COVID-19 Vaccine  Completed   HPV VACCINES  Aged Out   MAMMOGRAM  Discontinued   DEXA SCAN  Discontinued   COLONOSCOPY (Pts 45-36yrs Insurance coverage will need to be confirmed)  Discontinued    Physical Exam: Vitals:   09/15/21 1303  BP: (!) 140/87  Pulse: 89  Resp: 18  Temp: (!) 97.1 F (36.2 C)  SpO2: 95%  Weight: 159 lb (72.1 kg)   Body mass index is 24.9 kg/m. Physical Exam Vitals reviewed.  Constitutional:      Appearance: Normal appearance.  HENT:     Head: Normocephalic.     Nose: Nose normal.     Mouth/Throat:      Mouth: Mucous membranes are moist.     Pharynx: Oropharynx is clear.  Eyes:     Pupils: Pupils are equal, round, and reactive to light.  Cardiovascular:     Rate and Rhythm: Normal rate and regular rhythm.     Pulses: Normal pulses.     Heart sounds: Normal heart sounds. No murmur heard. Pulmonary:     Effort: Pulmonary effort is normal.     Breath sounds: Normal breath sounds.  Abdominal:     General: Abdomen is flat. Bowel sounds are normal.     Palpations: Abdomen is  soft.  Musculoskeletal:        General: No swelling.     Cervical back: Neck supple.  Skin:    General: Skin is warm.  Neurological:     General: No focal deficit present.     Mental Status: She is alert.  Psychiatric:        Mood and Affect: Mood normal.        Thought Content: Thought content normal.     Labs reviewed: Basic Metabolic Panel: Recent Labs    10/23/20 1813 10/24/20 0040 10/24/20 0232 10/25/20 0453 10/25/20 0453 12/07/20 0000 04/21/21 0000 06/02/21 0000 08/01/21 0000  NA 145  --  140 134*   < > 136* 139 140 142  K 4.3  --  3.9 4.3  --  4.7 4.7 4.6 4.4  CL 111  --  106 104  --  104 106 105 108  CO2 24  --  25 21*  --  24* 21 28* 25*  GLUCOSE 123*  --  131* 109*  --   --   --   --   --   BUN 28*  --  26* 18   < > 15 30* 24* 25*  CREATININE 0.62  --  0.70 0.67   < > 0.7 0.7 0.6 0.6  CALCIUM 10.3  --  9.6 8.4*  --  9.7 10.0 9.1 9.3  MG  --   --  1.6* 1.9  --   --   --   --   --   TSH  --  0.936  --   --   --  2.52  --   --   --    < > = values in this interval not displayed.   Liver Function Tests: Recent Labs    10/23/20 1813 10/24/20 0232 12/07/20 0000 04/21/21 0000 06/02/21 0000  AST 17 13* 12* 14 11*  ALT 16 16 8 11 9   ALKPHOS 55 56 51 49 40  BILITOT 0.7 0.6  --   --   --   PROT 7.0 6.7  --   --   --   ALBUMIN 3.1* 3.0* 3.5 4.0 3.8   No results for input(s): "LIPASE", "AMYLASE" in the last 8760 hours. Recent Labs    10/24/20 0040  AMMONIA 21   CBC: Recent Labs     10/23/20 1813 10/24/20 0232 10/25/20 0453 12/07/20 0000 04/21/21 0000 06/02/21 0000 08/01/21 0000  WBC 12.5* 12.1* 10.0   < > 8.1 7.1 7.7  NEUTROABS  --  9.3*  --   --  5,605.00  --  4,797.00  HGB 10.2* 10.7* 10.6*   < > 13.5 12.6 13.6  HCT 32.2* 33.4* 32.7*   < > 41 38 41  MCV 86.3 86.5 84.7  --   --   --   --   PLT 308 319 312   < > 282 193 214   < > = values in this interval not displayed.   Lipid Panel: Recent Labs    12/07/20 0000  CHOL 120  HDL 47  LDLCALC 50  TRIG 148   Lab Results  Component Value Date   HGBA1C 6.0 05/30/2021    Procedures since last visit: No results found.  Assessment/Plan 1. Frontotemporal dementia with behavioral disturbance (HCC) Unfortunately patient failed the taper of her Seroquel in the morning We will restart her at 12.5 mg for 3 days and then go up to 25 mg.  Also continue 50  mg of Seroquel at night Patient also on Depakote and Ativan as needed  2. Type 2 diabetes mellitus with peripheral vascular disease (HCC) CBGS 130-170 Last A1C was 6  3. Essential hypertension Low dose lisinopril  4. Mixed diabetic hyperlipidemia associated with type 2 diabetes mellitus (HCC) Continue on Statin LDL 50 in 10/22  5. Acquired hypothyroidism TSH normal in 10/22    Labs/tests ordered:  * No order type specified * Next appt:  Visit date not found

## 2021-09-19 DIAGNOSIS — R279 Unspecified lack of coordination: Secondary | ICD-10-CM | POA: Diagnosis not present

## 2021-09-19 DIAGNOSIS — R1310 Dysphagia, unspecified: Secondary | ICD-10-CM | POA: Diagnosis not present

## 2021-09-19 DIAGNOSIS — D649 Anemia, unspecified: Secondary | ICD-10-CM | POA: Diagnosis not present

## 2021-09-19 DIAGNOSIS — I10 Essential (primary) hypertension: Secondary | ICD-10-CM | POA: Diagnosis not present

## 2021-09-19 DIAGNOSIS — M6281 Muscle weakness (generalized): Secondary | ICD-10-CM | POA: Diagnosis not present

## 2021-09-19 DIAGNOSIS — R1312 Dysphagia, oropharyngeal phase: Secondary | ICD-10-CM | POA: Diagnosis not present

## 2021-09-19 LAB — CBC AND DIFFERENTIAL
HCT: 41 (ref 36–46)
Hemoglobin: 13.8 (ref 12.0–16.0)
Platelets: 173 10*3/uL (ref 150–400)
WBC: 6.6

## 2021-09-19 LAB — CBC: RBC: 4.49 (ref 3.87–5.11)

## 2021-09-19 LAB — BASIC METABOLIC PANEL
BUN: 27 — AB (ref 4–21)
CO2: 23 — AB (ref 13–22)
Chloride: 106 (ref 99–108)
Creatinine: 0.7 (ref 0.5–1.1)
Glucose: 133
Potassium: 4.2 mEq/L (ref 3.5–5.1)
Sodium: 139 (ref 137–147)

## 2021-09-19 LAB — COMPREHENSIVE METABOLIC PANEL
Calcium: 9.2 (ref 8.7–10.7)
eGFR: 94

## 2021-09-20 ENCOUNTER — Encounter: Payer: Self-pay | Admitting: Nurse Practitioner

## 2021-09-20 ENCOUNTER — Non-Acute Institutional Stay (SKILLED_NURSING_FACILITY): Payer: Medicare Other | Admitting: Nurse Practitioner

## 2021-09-20 DIAGNOSIS — R1312 Dysphagia, oropharyngeal phase: Secondary | ICD-10-CM | POA: Diagnosis not present

## 2021-09-20 DIAGNOSIS — F02818 Dementia in other diseases classified elsewhere, unspecified severity, with other behavioral disturbance: Secondary | ICD-10-CM

## 2021-09-20 DIAGNOSIS — E039 Hypothyroidism, unspecified: Secondary | ICD-10-CM | POA: Diagnosis not present

## 2021-09-20 DIAGNOSIS — I10 Essential (primary) hypertension: Secondary | ICD-10-CM

## 2021-09-20 DIAGNOSIS — E871 Hypo-osmolality and hyponatremia: Secondary | ICD-10-CM | POA: Diagnosis not present

## 2021-09-20 DIAGNOSIS — E1151 Type 2 diabetes mellitus with diabetic peripheral angiopathy without gangrene: Secondary | ICD-10-CM | POA: Diagnosis not present

## 2021-09-20 DIAGNOSIS — E785 Hyperlipidemia, unspecified: Secondary | ICD-10-CM | POA: Diagnosis not present

## 2021-09-20 DIAGNOSIS — G3109 Other frontotemporal dementia: Secondary | ICD-10-CM

## 2021-09-20 DIAGNOSIS — F339 Major depressive disorder, recurrent, unspecified: Secondary | ICD-10-CM

## 2021-09-20 DIAGNOSIS — R1310 Dysphagia, unspecified: Secondary | ICD-10-CM | POA: Diagnosis not present

## 2021-09-20 NOTE — Progress Notes (Unsigned)
Location:  [facility] Room Number: NO/29/A Place of Service:  SNF (31) Provider:  Saroya Riccobono X, NP Mahlon Gammon, MD  Patient Care Team: Mahlon Gammon, MD as PCP - General (Internal Medicine)  Extended Emergency Contact Information Primary Emergency Contact: [facility] Phone: [phone] Relation: Sister Secondary Emergency Contact: [facility] Phone: [phone] Mobile Phone: [phone] Relation: Brother  Code Status:  DNR Goals of care: Advanced Directive information    09/20/2021   11:13 AM  Advanced Directives  Does Patient Have a Medical Advance Directive? Yes  Type of Estate agent of [place];Living will;Out of facility DNR (pink MOST or yellow form)  Does patient want to make changes to medical advance directive? No - Patient declined  Copy of Healthcare Power of Attorney in Chart? Yes - validated most recent copy scanned in chart (See row information)  Pre-existing out of facility DNR order (yellow form or pink MOST form) Yellow form placed in chart (order not valid for inpatient use)     Chief Complaint  Patient presents with  . Medical Management of Chronic Issues    Patient is here for a follow up for chronic conditions discuss need nor updated vaccines and fall screening    HPI:  Pt is a 71 y.o. female seen today for managing chronic medical conditions.     Frontotemporal dementia with behavioral disturbance, resumed am Seroquel 25mg -failed GDR, takes Seroquel 50mg  qpm, Depakote,  ASA, Statin, Lorazepam for Loud fake sneezing.              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              HTN, on Lisinopril  Past Medical History:  Diagnosis Date  . Dementia (HCC)   . Diabetes mellitus without complication (HCC)   . 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 Thyroid disease    History reviewed. No pertinent surgical history.  No Known Allergies  Outpatient Encounter Medications as of 09/20/2021  Medication Sig  . acetaminophen (TYLENOL) 500 MG tablet Take 1,000 mg by mouth 2 (two) times daily as needed for fever (pain).  . AMBULATORY NON FORMULARY MEDICATION peg3350-sod sul-NaCl-KCl-asb-C  powder in packet; 100-7.5-2.691 gram; amt: 1 Capful (17 grams); oral  Special Instructions: Mix 1 capful (17 grams) in 4-8 oz of H2O and drink po daily  as needed for constipation Once A Day - PRN  . aspirin 81 MG chewable tablet Chew by mouth daily.  . calcium carbonate (OS-CAL) 600 MG TABS tablet Take 600 mg by mouth daily with breakfast.  . Camphor-Menthol-Methyl Sal (SALONPAS) 3.02-19-08 % PTCH Place 1 patch onto the skin See admin instructions. Apply one patch  transdermally to left aka stump daily as needed for pain  . citalopram (CELEXA) 10 MG tablet Take 10 mg by mouth every morning.  . Cranberry 450 MG TABS Take by mouth daily.  . divalproex (DEPAKOTE) 250 MG DR tablet Take 250 mg by mouth 3 (three) times daily.  Tery Sanfilippo Sodium 100 MG capsule Take 100 mg by mouth 2 (two) times daily.  Marland Kitchen guaiFENesin (ROBITUSSIN) 100 MG/5ML liquid Take 200 mg by mouth every 4 (four) hours as needed for cough or congestion.  . hydrocortisone cream 1 % Apply 1 Application topically 2 (two) times daily.  Marland Kitchen levothyroxine (SYNTHROID) 50 MCG tablet Take 50 mcg by mouth daily before breakfast.  . lisinopril (ZESTRIL) 2.5 MG tablet Take 2.5 mg by mouth every morning.  . loperamide (IMODIUM A-D) 2 MG tablet Take  2 mg by mouth every 8 (eight) hours as needed for diarrhea or loose stools.  Marland Kitchen LORazepam (ATIVAN) 0.5 MG tablet Take 1 tablet (0.5 mg total) by mouth 2 (two) times daily as needed for anxiety.  Marland Kitchen LORazepam (ATIVAN) 2 MG tablet Take 2 mg by mouth. Take 1 tablet as directed by [facility] staff  . melatonin 3 MG TABS tablet Take 3 mg by mouth at bedtime.  . Multiple Vitamins-Minerals (HEALTHY EYES/LUTEIN-ZEAXANTHIN) CAPS Take 1 capsule by mouth every morning.  Marland Kitchen NYAMYC powder Apply 1 application topically 2 (two) times daily as needed (groin rash).  . QUEtiapine (SEROQUEL) 25 MG tablet Take 25 mg by mouth every morning.  Marland Kitchen QUEtiapine (SEROQUEL) 50 MG tablet Take 50 mg by mouth at bedtime.  . simvastatin (ZOCOR) 20 MG tablet Take 20 mg by mouth at bedtime.  . sodium chloride 1 g tablet Take 1 g by mouth 2 (two) times daily.  . Sodium Fluoride (PREVIDENT 5000 BOOSTER PLUS) 1.1 % PSTE Place onto teeth daily. Brush on teeth with a toothbrush after PM mouth care. Spit out excess and do not rinse  . thiamine 100 MG tablet Take 100 mg by mouth every morning. Vitamin B1  . vitamin B-12 (CYANOCOBALAMIN) 500 MCG tablet Take 500 mcg by mouth daily.  Marland Kitchen zinc oxide 20 % ointment Apply 1 application topically 3 (three) times daily as needed (apply to buttocks for redness).   No facility-administered encounter medications on file as of 09/20/2021.    Review of Systems  Unable to perform ROS: Dementia    Immunization History  Administered Date(s) Administered  . Influenza-Unspecified 11/11/2019, 11/11/2020  . Moderna SARS-COV2 Booster Vaccination 02/02/2021  . Moderna Sars-Covid-2 Vaccination 03/06/2019, 04/03/2019, 12/18/2019, 11/11/2020  . [employer] officer, [employer] 68yrs & up 07/27/2021  . Pneumococcal Polysaccharide-23 12/09/2013, 11/25/2016  . Tdap 03/12/2012  . Zoster, Live 11/26/2012  . Zoster, Unspecified 11/26/2012   Pertinent  Health Maintenance Due  Topic Date Due  .  INFLUENZA VACCINE  09/13/2021  . HEMOGLOBIN A1C  11/29/2021  . FOOT EXAM  03/09/2022  . OPHTHALMOLOGY EXAM  06/02/2022  . MAMMOGRAM  Discontinued  . DEXA SCAN  Discontinued  . COLONOSCOPY (Pts 45-52yrs Insurance coverage will need to be confirmed)  Discontinued      10/24/2020    8:00 PM 10/25/2020   11:00 AM 10/25/2020    7:10 PM 10/26/2020   12:00 PM 03/04/2021    2:48 PM  Fall Risk  Falls in the past year?     1  Was there an injury with Fall?     0  Fall Risk Category Calculator  1  Fall Risk Category     Low  Patient Fall Risk Level High fall risk High fall risk High fall risk High fall risk Moderate fall risk  Patient at Risk for Falls Due to     History of fall(s);Impaired balance/gait;Impaired mobility  Fall risk Follow up     Falls evaluation completed;Education provided;Falls prevention discussed   Functional Status Survey:    Vitals:   09/20/21 1114  BP: 128/80  Pulse: 71  Resp: 20  Temp: (!) 97.2 F (36.2 C)  SpO2: 93%  Weight: 152 lb (68.9 kg)  Height: 5\' 7"  (1.702 m)   Body mass index is 23.81 kg/m. Physical Exam Vitals and nursing note reviewed.  Constitutional:      Appearance: Normal appearance.     Comments: Gradual weight loss noted, observe.   HENT:     Head: Normocephalic and atraumatic.     Nose: Nose normal.     Mouth/Throat:     Mouth: Mucous membranes are moist.  Eyes:     Extraocular Movements: Extraocular movements intact.     Conjunctiva/sclera: Conjunctivae normal.     Pupils: Pupils are equal, round, and reactive to light.  Cardiovascular:     Rate and Rhythm: Normal rate and regular rhythm.     Heart sounds: No murmur heard. Pulmonary:     Effort: Pulmonary effort is normal.  Abdominal:     General: Bowel sounds are normal. There is no distension.     Palpations: Abdomen is soft.     Tenderness: There is no guarding.     Comments: ? Abd tenderness-say yes to many different things.   Musculoskeletal:     Cervical back:  Normal range of motion and neck supple.     Right lower leg: No edema.     Comments: Left AKA  Skin:    General: Skin is warm and dry.  Neurological:     General: No focal deficit present.     Mental Status: She is alert. Mental status is at baseline.     Comments: Oriented to self.    Psychiatric:     Comments: Followed simple directions    Labs reviewed: Recent Labs    10/23/20 1813 10/24/20 0232 10/25/20 0453 12/07/20 0000 04/21/21 0000 06/02/21 0000 08/01/21 0000  NA 145 140 134*   < > 139 140 142  K 4.3 3.9 4.3   < > 4.7 4.6 4.4  CL 111 106 104   < > 106 105 108  CO2 24 25 21*   < > 21 28* 25*  GLUCOSE 123* 131* 109*  --   --   --   --   BUN 28* 26* 18   < > 30* 24* 25*  CREATININE 0.62 0.70 0.67   < > 0.7 0.6 0.6  CALCIUM 10.3 9.6 8.4*   < > 10.0 9.1 9.3  MG  --  1.6* 1.9  --   --   --   --    < > = values in this interval not displayed.   Recent Labs    10/23/20 1813 10/24/20 0232 12/07/20 0000 04/21/21 0000 06/02/21 0000  AST 17 13* 12* 14 11*  ALT 16 16 8 11 9   ALKPHOS 55 56 51 49 40  BILITOT 0.7 0.6  --   --   --   PROT 7.0 6.7  --   --   --   ALBUMIN 3.1* 3.0* 3.5 4.0 3.8   Recent  Labs    10/23/20 1813 10/24/20 0232 10/25/20 0453 12/07/20 0000 04/21/21 0000 06/02/21 0000 08/01/21 0000  WBC 12.5* 12.1* 10.0   < > 8.1 7.1 7.7  NEUTROABS  --  9.3*  --   --  5,605.00  --  4,797.00  HGB 10.2* 10.7* 10.6*   < > 13.5 12.6 13.6  HCT 32.2* 33.4* 32.7*   < > 41 38 41  MCV 86.3 86.5 84.7  --   --   --   --   PLT 308 319 312   < > 282 193 214   < > = values in this interval not displayed.   Lab Results  Component Value Date   TSH 2.52 12/07/2020   Lab Results  Component Value Date   HGBA1C 6.0 05/30/2021   Lab Results  Component Value Date   CHOL 120 12/07/2020   HDL 47 12/07/2020   LDLCALC 50 12/07/2020   TRIG 148 12/07/2020    Significant Diagnostic Results in last 30 days:  No results found.  Assessment/Plan Hyperlipidemia LDL 50  12/07/20, on Simvastatin  Hypothyroidism TSH 2.52 12/07/20, takes Levothyroxine.   Type 2 diabetes mellitus with peripheral vascular disease (HCC)  insulin dependent, on Lantus and Metformin, Hgb a1c 6.0 05/30/21  Hyponatremia  Na tabs, Na 142 08/01/21, psychogenic drinking   Depression, recurrent (HCC)  takes Citalopram, Lorazepam, Seroquel, Depakote.   Essential hypertension Blood pressure is controlled, continue Lisinopril.   Frontotemporal dementia with behavioral disturbance (HCC)  Frontotemporal dementia with behavioral disturbance, resumed am Seroquel 25mg -failed GDR, takes Seroquel 50mg  qpm, Depakote,  ASA, Statin, Lorazepam for Loud fake sneezing.              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      Family/ staff Communication: plan of care reviewed with the patient and charge nurse.   Labs/tests ordered:  none  Time spend 35 minutes.

## 2021-09-20 NOTE — Assessment & Plan Note (Signed)
takes Citalopram, Lorazepam, Seroquel, Depakote.

## 2021-09-20 NOTE — Assessment & Plan Note (Signed)
Blood pressure is controlled, continue Lisinopril.  

## 2021-09-20 NOTE — Assessment & Plan Note (Signed)
insulin dependent, on Lantus and Metformin, Hgb a1c 6.0 05/30/21

## 2021-09-20 NOTE — Assessment & Plan Note (Signed)
Na tabs, Na 142 08/01/21, psychogenic drinking

## 2021-09-20 NOTE — Assessment & Plan Note (Addendum)
Frontotemporal dementia with behavioral disturbance, resumed am Seroquel 25mg -failed GDR, takes Seroquel 50mg  qpm, Depakote,  ASA, Statin, Lorazepam for Loud fake sneezing.              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

## 2021-09-20 NOTE — Assessment & Plan Note (Signed)
TSH 2.52 12/07/20, takes Levothyroxine.

## 2021-09-20 NOTE — Assessment & Plan Note (Signed)
LDL 50 12/07/20, on Simvastatin

## 2021-09-21 DIAGNOSIS — R1310 Dysphagia, unspecified: Secondary | ICD-10-CM | POA: Diagnosis not present

## 2021-09-21 DIAGNOSIS — R1312 Dysphagia, oropharyngeal phase: Secondary | ICD-10-CM | POA: Diagnosis not present

## 2021-09-26 DIAGNOSIS — R1310 Dysphagia, unspecified: Secondary | ICD-10-CM | POA: Diagnosis not present

## 2021-09-26 DIAGNOSIS — R1312 Dysphagia, oropharyngeal phase: Secondary | ICD-10-CM | POA: Diagnosis not present

## 2021-09-27 DIAGNOSIS — R1310 Dysphagia, unspecified: Secondary | ICD-10-CM | POA: Diagnosis not present

## 2021-09-27 DIAGNOSIS — R1312 Dysphagia, oropharyngeal phase: Secondary | ICD-10-CM | POA: Diagnosis not present

## 2021-09-28 DIAGNOSIS — R1312 Dysphagia, oropharyngeal phase: Secondary | ICD-10-CM | POA: Diagnosis not present

## 2021-09-28 DIAGNOSIS — R1310 Dysphagia, unspecified: Secondary | ICD-10-CM | POA: Diagnosis not present

## 2021-09-30 ENCOUNTER — Other Ambulatory Visit: Payer: Self-pay | Admitting: Orthopedic Surgery

## 2021-09-30 DIAGNOSIS — G3109 Other frontotemporal dementia: Secondary | ICD-10-CM

## 2021-10-03 DIAGNOSIS — R1310 Dysphagia, unspecified: Secondary | ICD-10-CM | POA: Diagnosis not present

## 2021-10-03 DIAGNOSIS — R1312 Dysphagia, oropharyngeal phase: Secondary | ICD-10-CM | POA: Diagnosis not present

## 2021-10-04 DIAGNOSIS — R1312 Dysphagia, oropharyngeal phase: Secondary | ICD-10-CM | POA: Diagnosis not present

## 2021-10-04 DIAGNOSIS — R1310 Dysphagia, unspecified: Secondary | ICD-10-CM | POA: Diagnosis not present

## 2021-10-05 DIAGNOSIS — R1310 Dysphagia, unspecified: Secondary | ICD-10-CM | POA: Diagnosis not present

## 2021-10-05 DIAGNOSIS — R1312 Dysphagia, oropharyngeal phase: Secondary | ICD-10-CM | POA: Diagnosis not present

## 2021-10-09 DIAGNOSIS — R279 Unspecified lack of coordination: Secondary | ICD-10-CM | POA: Diagnosis not present

## 2021-10-09 DIAGNOSIS — R1312 Dysphagia, oropharyngeal phase: Secondary | ICD-10-CM | POA: Diagnosis not present

## 2021-10-09 DIAGNOSIS — R1310 Dysphagia, unspecified: Secondary | ICD-10-CM | POA: Diagnosis not present

## 2021-10-09 DIAGNOSIS — M6281 Muscle weakness (generalized): Secondary | ICD-10-CM | POA: Diagnosis not present

## 2021-10-10 DIAGNOSIS — R279 Unspecified lack of coordination: Secondary | ICD-10-CM | POA: Diagnosis not present

## 2021-10-10 DIAGNOSIS — R1310 Dysphagia, unspecified: Secondary | ICD-10-CM | POA: Diagnosis not present

## 2021-10-10 DIAGNOSIS — M6281 Muscle weakness (generalized): Secondary | ICD-10-CM | POA: Diagnosis not present

## 2021-10-10 DIAGNOSIS — R1312 Dysphagia, oropharyngeal phase: Secondary | ICD-10-CM | POA: Diagnosis not present

## 2021-10-12 DIAGNOSIS — M6281 Muscle weakness (generalized): Secondary | ICD-10-CM | POA: Diagnosis not present

## 2021-10-12 DIAGNOSIS — R279 Unspecified lack of coordination: Secondary | ICD-10-CM | POA: Diagnosis not present

## 2021-10-12 DIAGNOSIS — R1312 Dysphagia, oropharyngeal phase: Secondary | ICD-10-CM | POA: Diagnosis not present

## 2021-10-12 DIAGNOSIS — R1310 Dysphagia, unspecified: Secondary | ICD-10-CM | POA: Diagnosis not present

## 2021-10-14 DIAGNOSIS — R1312 Dysphagia, oropharyngeal phase: Secondary | ICD-10-CM | POA: Diagnosis not present

## 2021-10-14 DIAGNOSIS — R1310 Dysphagia, unspecified: Secondary | ICD-10-CM | POA: Diagnosis not present

## 2021-10-18 DIAGNOSIS — R1312 Dysphagia, oropharyngeal phase: Secondary | ICD-10-CM | POA: Diagnosis not present

## 2021-10-18 DIAGNOSIS — R1310 Dysphagia, unspecified: Secondary | ICD-10-CM | POA: Diagnosis not present

## 2021-10-19 DIAGNOSIS — R1312 Dysphagia, oropharyngeal phase: Secondary | ICD-10-CM | POA: Diagnosis not present

## 2021-10-19 DIAGNOSIS — R1310 Dysphagia, unspecified: Secondary | ICD-10-CM | POA: Diagnosis not present

## 2021-10-21 DIAGNOSIS — R1312 Dysphagia, oropharyngeal phase: Secondary | ICD-10-CM | POA: Diagnosis not present

## 2021-10-21 DIAGNOSIS — R1310 Dysphagia, unspecified: Secondary | ICD-10-CM | POA: Diagnosis not present

## 2021-10-24 DIAGNOSIS — R1312 Dysphagia, oropharyngeal phase: Secondary | ICD-10-CM | POA: Diagnosis not present

## 2021-10-24 DIAGNOSIS — R1310 Dysphagia, unspecified: Secondary | ICD-10-CM | POA: Diagnosis not present

## 2021-10-26 ENCOUNTER — Non-Acute Institutional Stay (SKILLED_NURSING_FACILITY): Payer: Medicare Other | Admitting: Orthopedic Surgery

## 2021-10-26 ENCOUNTER — Encounter: Payer: Self-pay | Admitting: Orthopedic Surgery

## 2021-10-26 DIAGNOSIS — U071 COVID-19: Secondary | ICD-10-CM

## 2021-10-26 DIAGNOSIS — G3109 Other frontotemporal dementia: Secondary | ICD-10-CM | POA: Diagnosis not present

## 2021-10-26 DIAGNOSIS — F02818 Dementia in other diseases classified elsewhere, unspecified severity, with other behavioral disturbance: Secondary | ICD-10-CM

## 2021-10-26 MED ORDER — VITAMIN C 1000 MG PO TABS: 1000.0000 mg | ORAL_TABLET | Freq: Every day | ORAL | 0 refills | Status: AC

## 2021-10-26 MED ORDER — LORAZEPAM 0.5 MG PO TABS: 0.5000 mg | ORAL_TABLET | Freq: Four times a day (QID) | ORAL | 0 refills | Status: DC | PRN

## 2021-10-26 MED ORDER — VITAMIN D3 25 MCG (1000 UT) PO CAPS: 2000.0000 [IU] | ORAL_CAPSULE | Freq: Every day | ORAL | 0 refills | Status: AC

## 2021-10-26 MED ORDER — ZINC 50 MG PO TABS: 50.0000 mg | ORAL_TABLET | Freq: Every day | ORAL | 0 refills | Status: AC

## 2021-10-26 NOTE — Progress Notes (Signed)
Location:  [facility] Room Number: 29/A Place of Service:  SNF [id number]) Provider:  Octavia Heir, NP   Mahlon Gammon, MD  Patient Care Team: Mahlon Gammon, MD as PCP - General (Internal Medicine)  Extended Emergency Contact Information Primary Emergency Contact: [facility] Phone: [phone] Relation: Sister Secondary Emergency Contact: [facility] Phone: [phone] Mobile Phone: [phone] Relation: Brother  Code Status:  DNR Goals of care: Advanced Directive information    09/20/2021   11:13 AM  Advanced Directives  Does Patient Have a Medical Advance Directive? Yes  Type of Estate agent of [place];Living will;Out of facility DNR (pink MOST or yellow form)  Does patient want to make changes to medical advance directive? No - Patient declined  Copy of Healthcare Power of Attorney in Chart? Yes - validated most recent copy scanned in chart (See row information)  Pre-existing out of facility DNR order (yellow form or pink MOST form) Yellow form placed in chart (order not valid for inpatient use)     Chief Complaint  Patient presents with   Acute Visit    covid    HPI:  Pt is a 71 y.o. female seen today for acute visit due to covid.   She currently resides on the skilled nursing unit at [facility] due to frontotemporal dementia. Past medical history includes: HTN, hypothyroidism, T2DM, alcohol abuse, psychosis,  wernicke-korsakoff syndrome, thrombocytopenia.   09/12 she tested positive for covid. Poor historian due to frontotemporal dementia. Symptoms include: malaise, low grade fever and nasal congestion. She has been placed on isolation precautions. Treatment options discussed with HPOA, does not wish to start paxlovid at this time. She has completed initial Moderna series and 3 booster vaccines.   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  No Known Allergies  Outpatient Encounter Medications as of 10/26/2021  Medication Sig   acetaminophen (TYLENOL) 500 MG tablet Take 1,000 mg by mouth 2 (two) times daily as needed for fever (pain).   AMBULATORY NON FORMULARY MEDICATION peg3350-sod sul-NaCl-KCl-asb-C  powder in packet; 100-7.5-2.691 gram; amt: 1 Capful (17 grams); oral  Special Instructions: Mix 1 capful (17 grams) in 4-8 oz of H2O and drink po daily  as needed for constipation Once A Day - PRN   aspirin 81 MG chewable tablet Chew by mouth daily.   calcium carbonate (OS-CAL) 600 MG TABS tablet Take 600 mg by mouth daily with breakfast.   Camphor-Menthol-Methyl Sal (SALONPAS) 3.02-19-08 % PTCH Place 1 patch onto the skin See admin instructions. Apply one patch transdermally to left aka stump daily as needed for pain   citalopram (CELEXA) 10 MG tablet Take 10 mg by mouth every morning.   Cranberry 450 MG TABS Take by mouth daily.   divalproex (DEPAKOTE) 250 MG DR tablet Take 250 mg by mouth 3 (three) times daily.   Docusate Sodium 100 MG capsule Take 100 mg by mouth 2 (two) times daily.   guaiFENesin (ROBITUSSIN) 100 MG/5ML liquid Take 200 mg by mouth every 4 (four)  hours as needed for cough or congestion.   hydrocortisone cream 1 % Apply 1 Application topically 2 (two) times daily.   levothyroxine (SYNTHROID) 50 MCG tablet Take 50 mcg by mouth daily before breakfast.   lisinopril (ZESTRIL) 2.5 MG tablet Take 2.5 mg by mouth every morning.   loperamide (IMODIUM A-D) 2 MG tablet Take 2 mg by mouth every 8 (eight) hours  as needed for diarrhea or loose stools.   LORazepam (ATIVAN) 0.5 MG tablet Take 1 tablet (0.5 mg total) by mouth 2 (two) times daily as needed for anxiety.   LORazepam (ATIVAN) 2 MG tablet Take 2 mg by mouth. Take 1 tablet as directed by [facility] staff   melatonin 3 MG TABS tablet Take 3 mg by mouth at bedtime.   Multiple Vitamins-Minerals (HEALTHY EYES/LUTEIN-ZEAXANTHIN) CAPS Take 1 capsule by mouth every morning.   NYAMYC powder Apply 1 application topically 2 (two) times daily as needed (groin rash).   QUEtiapine (SEROQUEL) 25 MG tablet Take 25 mg by mouth every morning.   QUEtiapine (SEROQUEL) 50 MG tablet Take 50 mg by mouth at bedtime.   simvastatin (ZOCOR) 20 MG tablet Take 20 mg by mouth at bedtime.   sodium chloride 1 g tablet Take 1 g by mouth 2 (two) times daily.   Sodium Fluoride (PREVIDENT 5000 BOOSTER PLUS) 1.1 % PSTE Place onto teeth daily. Brush on teeth with a toothbrush after PM mouth care. Spit out excess and do not rinse   thiamine 100 MG tablet Take 100 mg by mouth every morning. Vitamin B1   vitamin B-12 (CYANOCOBALAMIN) 500 MCG tablet Take 500 mcg by mouth daily.   zinc oxide 20 % ointment Apply 1 application topically 3 (three) times daily as needed (apply to buttocks for redness).   No facility-administered encounter medications on file as of 10/26/2021.    Review of Systems  Unable to perform ROS: Dementia    Immunization History  Administered Date(s) Administered   Influenza-Unspecified 11/11/2019, 11/11/2020   Moderna SARS-COV2 Booster Vaccination 02/02/2021   Moderna Sars-Covid-2 Vaccination 03/06/2019, 04/03/2019, 12/18/2019, 11/11/2020   Pfizer Covid-19 Vaccine Bivalent Booster 73yrs & up 07/27/2021   Pneumococcal Polysaccharide-23 12/09/2013, 11/25/2016   Tdap 03/12/2012   Zoster, Live 11/26/2012   Zoster, Unspecified 11/26/2012   Pertinent  Health Maintenance Due  Topic Date Due   INFLUENZA VACCINE  09/13/2021   HEMOGLOBIN A1C  11/29/2021    FOOT EXAM  03/09/2022   OPHTHALMOLOGY EXAM  06/02/2022   MAMMOGRAM  Discontinued   DEXA SCAN  Discontinued   COLONOSCOPY (Pts 45-4yrs Insurance coverage will need to be confirmed)  Discontinued      10/24/2020    8:00 PM 10/25/2020   11:00 AM 10/25/2020    7:10 PM 10/26/2020   12:00 PM 03/04/2021    2:48 PM  Fall Risk  Falls in the past year?     1  Was there an injury with Fall?     0  Fall Risk Category Calculator     1  Fall Risk Category     Low  Patient Fall Risk Level High fall risk High fall risk High fall risk High fall risk Moderate fall risk  Patient at Risk for Falls Due to     History of fall(s);Impaired balance/gait;Impaired mobility  Fall risk Follow up     Falls evaluation completed;Education provided;Falls prevention discussed   Functional Status Survey:    Vitals:   10/26/21 1223  BP: (!) 138/100  Pulse: 65  Resp: 20  Temp: 99.1 F (37.3 C)  SpO2: 100%  Weight: 158 lb 8 oz (71.9 kg)  Height: 5\' 7"  (1.702 m)   Body mass index is 24.82 kg/m. Physical Exam Vitals reviewed.  Constitutional:      General: She is not in acute distress. HENT:     Head: Normocephalic.     Nose: Congestion present.     Mouth/Throat:     Mouth: Mucous membranes are moist.     Pharynx: No posterior oropharyngeal erythema.  Eyes:     General:        Right eye: No discharge.        Left eye: No discharge.  Cardiovascular:     Rate and Rhythm: Normal rate and regular rhythm.     Pulses: Normal pulses.     Heart sounds: Normal heart sounds.  Pulmonary:     Effort: Pulmonary effort is normal. No respiratory distress.     Breath sounds: Normal breath sounds. No wheezing.  Abdominal:     General: Bowel sounds are normal. There is no distension.     Palpations: Abdomen is soft.     Tenderness: There is no abdominal tenderness.  Musculoskeletal:     Cervical back: Neck supple.     Right lower leg: No edema.     Comments: Left BKA  Lymphadenopathy:     Cervical: No  cervical adenopathy.  Skin:    General: Skin is warm and dry.     Capillary Refill: Capillary refill takes less than 2 seconds.  Neurological:     General: No focal deficit present.     Mental Status: She is alert. Mental status is at baseline.     Motor: Weakness present.     Gait: Gait abnormal.  Psychiatric:        Mood and Affect: Mood normal.        Behavior: Behavior normal.     Comments: Does not follow commands, alert to self     Labs reviewed: Recent Labs    04/21/21 0000 06/02/21 0000 08/01/21 0000  NA 139 140 142  K 4.7 4.6 4.4  CL 106 105 108  CO2 21 28* 25*  BUN 30* 24* 25*  CREATININE 0.7 0.6 0.6  CALCIUM 10.0 9.1 9.3   Recent Labs    12/07/20 0000 04/21/21 0000 06/02/21 0000  AST 12* 14 11*  ALT 8 11 9   ALKPHOS 51 49 40  ALBUMIN 3.5 4.0 3.8   Recent Labs    04/21/21 0000 06/02/21 0000 08/01/21 0000  WBC 8.1 7.1 7.7  NEUTROABS 5,605.00  --  4,797.00  HGB 13.5 12.6 13.6  HCT 41 38 41  PLT 282 193 214   Lab Results  Component Value Date   TSH 2.52 12/07/2020   Lab Results  Component Value Date   HGBA1C 6.0 05/30/2021   Lab Results  Component Value Date   CHOL 120 12/07/2020   HDL 47 12/07/2020   LDLCALC 50 12/07/2020   TRIG 148 12/07/2020    Significant Diagnostic Results in last 30 days:  No results found.  Assessment/Plan 1. COVID-19 - 09/12 tested positive - Symptoms: malaise, fever, nasal congestion - HPOA does not want paxlovid - start isolation precautions - start vitamin C 1000 mg po daily x 7 days - start Vitamin D 2000 units po daily x 7 days - start Zinc 50 mg po daily x 7 days - cont tylenol for fever/generalized pain  2. Frontotemporal dementia  with behavioral disturbance (HCC) - dependent with all ADLs including feeding - still having tics of sneezing at times - cont Ativan, Seroquel, and Depakote    Family/ staff Communication: plan discussed with patient, HPOA, nurse  Labs/tests ordered:  none

## 2021-10-31 DIAGNOSIS — R1310 Dysphagia, unspecified: Secondary | ICD-10-CM | POA: Diagnosis not present

## 2021-10-31 DIAGNOSIS — R1312 Dysphagia, oropharyngeal phase: Secondary | ICD-10-CM | POA: Diagnosis not present

## 2021-11-03 ENCOUNTER — Encounter: Payer: Self-pay | Admitting: Internal Medicine

## 2021-11-03 ENCOUNTER — Non-Acute Institutional Stay (SKILLED_NURSING_FACILITY): Payer: Medicare Other | Admitting: Internal Medicine

## 2021-11-03 DIAGNOSIS — F339 Major depressive disorder, recurrent, unspecified: Secondary | ICD-10-CM

## 2021-11-03 DIAGNOSIS — U071 COVID-19: Secondary | ICD-10-CM | POA: Diagnosis not present

## 2021-11-03 DIAGNOSIS — E039 Hypothyroidism, unspecified: Secondary | ICD-10-CM | POA: Diagnosis not present

## 2021-11-03 DIAGNOSIS — F02818 Dementia in other diseases classified elsewhere, unspecified severity, with other behavioral disturbance: Secondary | ICD-10-CM | POA: Diagnosis not present

## 2021-11-03 DIAGNOSIS — E1151 Type 2 diabetes mellitus with diabetic peripheral angiopathy without gangrene: Secondary | ICD-10-CM

## 2021-11-03 DIAGNOSIS — E785 Hyperlipidemia, unspecified: Secondary | ICD-10-CM | POA: Diagnosis not present

## 2021-11-03 DIAGNOSIS — E871 Hypo-osmolality and hyponatremia: Secondary | ICD-10-CM | POA: Diagnosis not present

## 2021-11-03 DIAGNOSIS — G3109 Other frontotemporal dementia: Secondary | ICD-10-CM | POA: Diagnosis not present

## 2021-11-04 ENCOUNTER — Encounter: Payer: Self-pay | Admitting: Internal Medicine

## 2021-11-04 NOTE — Progress Notes (Signed)
Location:  [facility] Room Number: 29/A Place of Service:  SNF (31)  Provider:   Code Status: DNR Goals of Care:     11/03/2021    2:15 PM  Advanced Directives  Does Patient Have a Medical Advance Directive? Yes  Type of Estate agent of [place];Out of facility DNR (pink MOST or yellow form)  Does patient want to make changes to medical advance directive? No - Patient declined  Copy of Healthcare Power of Attorney in Chart? Yes - validated most recent copy scanned in chart (See row information)  Pre-existing out of facility DNR order (yellow form or pink MOST form) Yellow form placed in chart (order not valid for inpatient use)     Chief Complaint  Patient presents with   Medical Management of Chronic Issues    Routine    HPI: Patient is a 71 y.o. female seen today for medical management of chronic diseases.    Lives in SNF   Patient was diagnosed with frontotemporal dementia in 2019.  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 She is retired Engineer, civil (consulting) Her sister is the POA    In Film/video editor for Covid Infection Doing well No Symptoms anymore Her Behaviors are better also right now She has h/o Fake sneezing and Screaming  No Nursing issues Wt Readings from Last 3 Encounters:  11/04/21 158 lb 8 oz (71.9 kg)  10/26/21 158 lb 8 oz (71.9 kg)  09/20/21 152 lb (68.9 kg)    No other acute issue Patient unable to give any history due to her aphasia and dementia Past Medical History:  Diagnosis Date   Dementia (HCC)    Diabetes mellitus without complication (HCC)    Hx of BKA, left (HCC) 10/24/2020   Thyroid disease     History reviewed. No pertinent surgical  history.  No Known Allergies  Outpatient Encounter Medications as of 11/03/2021  Medication Sig   acetaminophen (TYLENOL) 500 MG tablet Take 1,000 mg by mouth 2 (two) times daily as needed for fever (pain).   AMBULATORY NON FORMULARY MEDICATION peg3350-sod sul-[id number]-KCl-[id number]-C  powder in packet; 100-7.5-2.691 gram; amt: 1 Capful (17 grams); oral  Special Instructions: Mix 1 capful (17 grams) in 4-8 oz of H2O and drink po daily  as needed for constipation Once A Day - PRN   aspirin 81 MG chewable tablet Chew by mouth daily.   calcium carbonate (OS-CAL) 600 MG TABS tablet Take 600 mg by mouth daily with breakfast.   Camphor-Menthol-Methyl Sal (SALONPAS) 3.02-19-08 % PTCH Place 1 patch onto the skin See admin instructions. Apply one patch transdermally to left aka stump daily as needed for pain   citalopram (CELEXA) 10 MG tablet Take 10 mg by mouth every morning.   Cranberry 450 MG TABS Take by mouth daily.   divalproex (DEPAKOTE) 250 MG DR tablet Take 250 mg by mouth 3 (three) times daily.   Docusate Sodium 100 MG capsule Take 100 mg by mouth 2 (two) times daily.   guaiFENesin (ROBITUSSIN) 100 MG/5ML liquid Take 200 mg by mouth every 4 (four) hours as needed for cough or congestion.   hydrocortisone cream 1 % Apply 1 Application topically 2 (two) times daily.   levothyroxine (SYNTHROID) 50 MCG tablet Take  50 mcg by mouth daily before breakfast.   lisinopril (ZESTRIL) 2.5 MG tablet Take 2.5 mg by mouth every morning.   loperamide (IMODIUM A-D) 2 MG tablet Take 2 mg by mouth every 8 (eight) hours as needed for diarrhea or loose stools.   LORazepam (ATIVAN) 0.5 MG tablet Take 1 tablet (0.5 mg total) by mouth 4 (four) times daily as needed for anxiety.   LORazepam (ATIVAN) 2 MG tablet Take 2 mg by mouth. Take 1 tablet as directed by [facility] staff   melatonin 3 MG TABS tablet Take 3 mg by mouth at bedtime.   Multiple Vitamins-Minerals (HEALTHY EYES/LUTEIN-ZEAXANTHIN) CAPS Take 1 capsule by  mouth every morning. DO NOT CRUSH   NYAMYC powder Apply 1 application topically 2 (two) times daily as needed (groin rash).   QUEtiapine (SEROQUEL) 25 MG tablet Take 25 mg by mouth every morning.   QUEtiapine (SEROQUEL) 50 MG tablet Take 50 mg by mouth at bedtime.   simvastatin (ZOCOR) 20 MG tablet Take 20 mg by mouth at bedtime.   sodium chloride 1 g tablet Take 1 g by mouth 2 (two) times daily.   Sodium Fluoride (PREVIDENT 5000 BOOSTER PLUS) 1.1 % PSTE Place onto teeth daily. Brush on teeth with a toothbrush after PM mouth care. Spit out excess and do not rinse   thiamine 100 MG tablet Take 100 mg by mouth every morning. Vitamin B1   vitamin B-12 (CYANOCOBALAMIN) 500 MCG tablet Take 500 mcg by mouth daily.   zinc oxide 20 % ointment Apply 1 application topically 3 (three) times daily as needed (apply to buttocks for redness).   No facility-administered encounter medications on file as of 11/03/2021.    Review of Systems:  Review of Systems  Unable to perform ROS: Dementia    Health Maintenance  Topic Date Due   Hepatitis C Screening  Never done   Zoster Vaccines- Shingrix (1 of 2) 11/26/1970   Pneumonia Vaccine 11+ Years old (2 - PCV) 11/25/2017   Diabetic kidney evaluation - Urine ACR  02/23/2019   INFLUENZA VACCINE  09/13/2021   COVID-19 Vaccine (6 - Moderna risk series) 09/21/2021   HEMOGLOBIN A1C  11/29/2021   FOOT EXAM  03/09/2022   TETANUS/TDAP  03/12/2022   OPHTHALMOLOGY EXAM  06/02/2022   Diabetic kidney evaluation - GFR measurement  09/20/2022   HPV VACCINES  Aged Out   MAMMOGRAM  Discontinued   DEXA SCAN  Discontinued   COLONOSCOPY (Pts 45-70yrs Insurance coverage will need to be confirmed)  Discontinued    Physical Exam: There were no vitals filed for this visit. There is no height or weight on file to calculate BMI. Physical Exam Vitals reviewed.  Constitutional:      Appearance: Normal appearance.  HENT:     Head: Normocephalic.     Nose: Nose normal.      Mouth/Throat:     Mouth: Mucous membranes are moist.     Pharynx: Oropharynx is clear.  Eyes:     Pupils: Pupils are equal, round, and reactive to light.  Cardiovascular:     Rate and Rhythm: Normal rate and regular rhythm.     Pulses: Normal pulses.     Heart sounds: Normal heart sounds. No murmur heard. Pulmonary:     Effort: Pulmonary effort is normal.     Breath sounds: Normal breath sounds.  Abdominal:     General: Abdomen is flat. Bowel sounds are normal.     Palpations: Abdomen is soft.  Musculoskeletal:        General: No swelling.     Cervical back: Neck supple.  Skin:    General: Skin is warm.  Neurological:     Mental Status: She is alert.  Psychiatric:        Mood and Affect: Mood normal.        Thought Content: Thought content normal.     Labs reviewed: Basic Metabolic Panel: Recent Labs    12/07/20 0000 04/21/21 0000 06/02/21 0000 08/01/21 0000 09/19/21 0000  NA 136*   < > 140 142 139  K 4.7   < > 4.6 4.4 4.2  CL 104   < > 105 108 106  CO2 24*   < > 28* 25* 23*  BUN 15   < > 24* 25* 27*  CREATININE 0.7   < > 0.6 0.6 0.7  CALCIUM 9.7   < > 9.1 9.3 9.2  TSH 2.52  --   --   --   --    < > = values in this interval not displayed.   Liver Function Tests: Recent Labs    12/07/20 0000 04/21/21 0000 06/02/21 0000  AST 12* 14 11*  ALT 8 11 9   ALKPHOS 51 49 40  ALBUMIN 3.5 4.0 3.8   No results for input(s): "LIPASE", "AMYLASE" in the last 8760 hours. No results for input(s): "AMMONIA" in the last 8760 hours. CBC: Recent Labs    04/21/21 0000 06/02/21 0000 08/01/21 0000 09/19/21 0000  WBC 8.1 7.1 7.7 6.6  NEUTROABS 5,605.00  --  4,797.00  --   HGB 13.5 12.6 13.6 13.8  HCT 41 38 41 41  PLT 282 193 214 173   Lipid Panel: Recent Labs    12/07/20 0000  CHOL 120  HDL 47  LDLCALC 50  TRIG 148   Lab Results  Component Value Date   HGBA1C 6.0 05/30/2021    Procedures since last visit: No results found.  Assessment/Plan 1.  COVID-19 Doing well  2. Frontotemporal dementia with behavioral disturbance (HCC) On Seroquel and Depakote Also on Ativan Prn Seems to be well right now with her screaming  3. Hyperlipidemia, unspecified hyperlipidemia type On statin Needs Repeat Lipid panel  4. Type 2 diabetes mellitus with peripheral vascular disease (HCC) CBGS 130-160 Repeat A1C  5. Acquired hypothyroidism Repeat TSH  6. Depression, recurrent (HCC) On Celexa  7. 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  Markedly severe cerebral atrophy most significant temporal parietal lobes. Clinically correlate for Alzheimer's   Sister does not want more Work up 2020 aspirin Already on statin     Labs/tests ordered:  CMP,CBC,TSH,Lipid  Next appt:  Visit date not found

## 2021-11-05 DIAGNOSIS — R1310 Dysphagia, unspecified: Secondary | ICD-10-CM | POA: Diagnosis not present

## 2021-11-05 DIAGNOSIS — R1312 Dysphagia, oropharyngeal phase: Secondary | ICD-10-CM | POA: Diagnosis not present

## 2021-11-05 NOTE — Progress Notes (Signed)
A user error has taken place.

## 2021-11-07 DIAGNOSIS — R1312 Dysphagia, oropharyngeal phase: Secondary | ICD-10-CM | POA: Diagnosis not present

## 2021-11-07 DIAGNOSIS — R1310 Dysphagia, unspecified: Secondary | ICD-10-CM | POA: Diagnosis not present

## 2021-11-08 DIAGNOSIS — R1312 Dysphagia, oropharyngeal phase: Secondary | ICD-10-CM | POA: Diagnosis not present

## 2021-11-08 DIAGNOSIS — R1310 Dysphagia, unspecified: Secondary | ICD-10-CM | POA: Diagnosis not present

## 2021-11-10 DIAGNOSIS — E119 Type 2 diabetes mellitus without complications: Secondary | ICD-10-CM | POA: Diagnosis not present

## 2021-11-10 DIAGNOSIS — R1312 Dysphagia, oropharyngeal phase: Secondary | ICD-10-CM | POA: Diagnosis not present

## 2021-11-10 DIAGNOSIS — I10 Essential (primary) hypertension: Secondary | ICD-10-CM | POA: Diagnosis not present

## 2021-11-10 DIAGNOSIS — R1310 Dysphagia, unspecified: Secondary | ICD-10-CM | POA: Diagnosis not present

## 2021-11-11 DIAGNOSIS — R1310 Dysphagia, unspecified: Secondary | ICD-10-CM | POA: Diagnosis not present

## 2021-11-11 DIAGNOSIS — R1312 Dysphagia, oropharyngeal phase: Secondary | ICD-10-CM | POA: Diagnosis not present

## 2021-11-15 DIAGNOSIS — R1312 Dysphagia, oropharyngeal phase: Secondary | ICD-10-CM | POA: Diagnosis not present

## 2021-11-15 DIAGNOSIS — R1310 Dysphagia, unspecified: Secondary | ICD-10-CM | POA: Diagnosis not present

## 2021-11-16 ENCOUNTER — Non-Acute Institutional Stay (SKILLED_NURSING_FACILITY): Payer: Medicare Other | Admitting: Orthopedic Surgery

## 2021-11-16 ENCOUNTER — Encounter: Payer: Self-pay | Admitting: Orthopedic Surgery

## 2021-11-16 DIAGNOSIS — B350 Tinea barbae and tinea capitis: Secondary | ICD-10-CM

## 2021-11-16 DIAGNOSIS — F02818 Dementia in other diseases classified elsewhere, unspecified severity, with other behavioral disturbance: Secondary | ICD-10-CM

## 2021-11-16 DIAGNOSIS — B029 Zoster without complications: Secondary | ICD-10-CM | POA: Diagnosis not present

## 2021-11-16 DIAGNOSIS — L219 Seborrheic dermatitis, unspecified: Secondary | ICD-10-CM

## 2021-11-16 DIAGNOSIS — G3109 Other frontotemporal dementia: Secondary | ICD-10-CM | POA: Diagnosis not present

## 2021-11-16 MED ORDER — KETOCONAZOLE 2 % EX SHAM: MEDICATED_SHAMPOO | CUTANEOUS | Status: DC

## 2021-11-16 MED ORDER — VALACYCLOVIR HCL 1 G PO TABS: 1000.0000 mg | ORAL_TABLET | Freq: Two times a day (BID) | ORAL | 0 refills | Status: AC

## 2021-11-16 NOTE — Progress Notes (Signed)
Location:  [facility] Room Number: 29/A Place of Service:  SNF [phone]) Provider:  Octavia Heir, NP   Mahlon Gammon, MD  Patient Care Team: Mahlon Gammon, MD as PCP - General (Internal Medicine)  Extended Emergency Contact Information Primary Emergency Contact: [facility] Phone: [phone] Relation: Sister Secondary Emergency Contact: [facility] Phone: [phone] Mobile Phone: [phone] Relation: Brother  Code Status:  DNR Goals of care: Advanced Directive information    11/03/2021    2:15 PM  Advanced Directives  Does Patient Have a Medical Advance Directive? Yes  Type of Estate agent of [place];Out of facility DNR (pink MOST or yellow form)  Does patient want to make changes to medical advance directive? No - Patient declined  Copy of Healthcare Power of Attorney in Chart? Yes - validated most recent copy scanned in chart (See row information)  Pre-existing out of facility DNR order (yellow form or pink MOST form) Yellow form placed in chart (order not valid for inpatient use)     Chief Complaint  Patient presents with   Acute Visit    rash    HPI:  Pt is a 71 y.o. female seen today for acute visit due to rash.   New onset rash to forehead x 2 days. She is a poor historian due to frontotemporal dementia. Nursing reports scalp has been greasy with lesions > 1 month. They have tried Selsun blue shampoo without success. Family brought new facial cream yesterday to try. Main ingredient petroleum. Rash to forehead worse today. Nursing also reports resident bedside her diagnosed with shingles yesterday.    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  No Known Allergies  Outpatient Encounter Medications as of 11/16/2021  Medication Sig    acetaminophen (TYLENOL) 500 MG tablet Take 1,000 mg by mouth 2 (two) times daily as needed for fever (pain).   AMBULATORY NON FORMULARY MEDICATION peg3350-sod sul-NaCl-KCl-asb-C  powder in packet; 100-7.5-2.691 gram; amt: 1 Capful (17 grams); oral  Special Instructions: Mix 1 capful (17 grams) in 4-8 oz of H2O and drink po daily  as needed for constipation Once A Day - PRN   aspirin 81 MG chewable tablet Chew by mouth daily.   calcium carbonate (OS-CAL) 600 MG TABS tablet Take 600 mg by mouth daily with breakfast.   Camphor-Menthol-Methyl Sal (SALONPAS) 3.02-19-08 % PTCH Place 1 patch onto the skin See admin instructions. Apply one patch transdermally to left aka stump daily as needed for pain   citalopram (CELEXA) 10 MG tablet Take 10 mg by mouth every morning.   Cranberry 450 MG TABS Take by mouth daily.   divalproex (DEPAKOTE) 250 MG DR tablet Take 250 mg by mouth 3 (three) times daily.   Docusate Sodium 100 MG capsule Take 100 mg by mouth 2 (two) times daily.   guaiFENesin (ROBITUSSIN) 100 MG/5ML liquid Take 200 mg by mouth every 4 (four) hours as needed for cough or congestion.   hydrocortisone cream 1 % Apply 1 Application topically 2 (two) times daily.   levothyroxine (SYNTHROID) 50 MCG tablet Take 50 mcg by mouth daily before breakfast.   lisinopril (ZESTRIL) 2.5 MG tablet Take 2.5 mg by mouth every morning.   loperamide (IMODIUM A-D) 2 MG tablet Take 2 mg by mouth every 8 (eight)  hours as needed for diarrhea or loose stools.   LORazepam (ATIVAN) 0.5 MG tablet Take 0.5 mg by mouth 4 (four) times daily. 4 times a day PRN (as needed)   LORazepam (ATIVAN) 2 MG tablet Take 2 mg by mouth. Take 1 tablet as directed by [facility] staff   melatonin 3 MG TABS tablet Take 3 mg by mouth at bedtime.   Multiple Vitamins-Minerals (HEALTHY EYES/LUTEIN-ZEAXANTHIN) CAPS Take 1 capsule by mouth every morning. DO NOT CRUSH   NYAMYC powder Apply 1 application topically 2 (two) times daily as needed  (groin rash).   QUEtiapine (SEROQUEL) 25 MG tablet Take 25 mg by mouth every morning.   QUEtiapine (SEROQUEL) 50 MG tablet Take 50 mg by mouth at bedtime.   simvastatin (ZOCOR) 20 MG tablet Take 20 mg by mouth at bedtime.   sodium chloride 1 g tablet Take 1 g by mouth once.   Sodium Fluoride (PREVIDENT 5000 BOOSTER PLUS) 1.1 % PSTE Place onto teeth daily. Brush on teeth with a toothbrush after PM mouth care. Spit out excess and do not rinse   thiamine 100 MG tablet Take 100 mg by mouth every morning. Vitamin B1   vitamin B-12 (CYANOCOBALAMIN) 500 MCG tablet Take 500 mcg by mouth daily.   zinc oxide 20 % ointment Apply 1 application topically 3 (three) times daily as needed (apply to buttocks for redness).   No facility-administered encounter medications on file as of 11/16/2021.    Review of Systems  Unable to perform ROS: Dementia    Immunization History  Administered Date(s) Administered   Influenza-Unspecified 11/11/2019, 11/11/2020   Moderna SARS-COV2 Booster Vaccination 02/02/2021   Moderna Sars-Covid-2 Vaccination 03/06/2019, 04/03/2019, 12/18/2019, 11/11/2020   Pfizer Covid-19 Vaccine Bivalent Booster 36yrs & up 07/27/2021   Pneumococcal Polysaccharide-23 12/09/2013, 11/25/2016   Tdap 03/12/2012   Zoster, Live 11/26/2012   Zoster, Unspecified 11/26/2012   Pertinent  Health Maintenance Due  Topic Date Due   INFLUENZA VACCINE  09/13/2021   HEMOGLOBIN A1C  11/29/2021   FOOT EXAM  03/09/2022   OPHTHALMOLOGY EXAM  06/02/2022   MAMMOGRAM  Discontinued   DEXA SCAN  Discontinued   COLONOSCOPY (Pts 45-40yrs Insurance coverage will need to be confirmed)  Discontinued      10/24/2020    8:00 PM 10/25/2020   11:00 AM 10/25/2020    7:10 PM 10/26/2020   12:00 PM 03/04/2021    2:48 PM  Fall Risk  Falls in the past year?     1  Was there an injury with Fall?     0  Fall Risk Category Calculator     1  Fall Risk Category     Low  Patient Fall Risk Level High fall risk High fall risk  High fall risk High fall risk Moderate fall risk  Patient at Risk for Falls Due to     History of fall(s);Impaired balance/gait;Impaired mobility  Fall risk Follow up     Falls evaluation completed;Education provided;Falls prevention discussed   Functional Status Survey:    Vitals:   11/16/21 1606  BP: 134/70  Pulse: 71  Resp: 20  Temp: (!) 97.3 F (36.3 C)  SpO2: 95%  Weight: 158 lb 8 oz (71.9 kg)  Height: 5\' 7"  (1.702 m)   Body mass index is 24.82 kg/m. Physical Exam Vitals reviewed.  Constitutional:      General: She is not in acute distress. HENT:     Head: Normocephalic.     Comments: Hair with  greasy appearance, scattered red lesions to scalp some covered with thick exudate    Right Ear: Tympanic membrane, ear canal and external ear normal.     Left Ear: Tympanic membrane, ear canal and external ear normal.  Eyes:     General:        Right eye: No discharge.        Left eye: No discharge.     Comments: No rash to eyes or swelling of eyelids  Cardiovascular:     Rate and Rhythm: Normal rate and regular rhythm.     Pulses: Normal pulses.     Heart sounds: Normal heart sounds.  Pulmonary:     Effort: Pulmonary effort is normal. No respiratory distress.     Breath sounds: Normal breath sounds. No wheezing.  Abdominal:     General: Bowel sounds are normal. There is no distension.     Palpations: Abdomen is soft.     Tenderness: There is no abdominal tenderness.  Musculoskeletal:     Cervical back: Neck supple.     Right lower leg: No edema.     Left lower leg: No edema.     Comments: Left BKA  Skin:    General: Skin is warm and dry.     Capillary Refill: Capillary refill takes less than 2 seconds.     Findings: Lesion and rash present.     Comments: Scattered red lesions (vary in size), some with crusts to crown of forehead, small linear area noted to left neck below left ear  Neurological:     General: No focal deficit present.     Mental Status: She is  alert. Mental status is at baseline.     Motor: Weakness present.     Gait: Gait abnormal.     Comments: wheelchair  Psychiatric:     Comments: Non verbal, does not follow commands, alert to self     Labs reviewed: Recent Labs    06/02/21 0000 08/01/21 0000 09/19/21 0000  NA 140 142 139  K 4.6 4.4 4.2  CL 105 108 106  CO2 28* 25* 23*  BUN 24* 25* 27*  CREATININE 0.6 0.6 0.7  CALCIUM 9.1 9.3 9.2   Recent Labs    12/07/20 0000 04/21/21 0000 06/02/21 0000  AST 12* 14 11*  ALT 8 11 9   ALKPHOS 51 49 40  ALBUMIN 3.5 4.0 3.8   Recent Labs    04/21/21 0000 06/02/21 0000 08/01/21 0000 09/19/21 0000  WBC 8.1 7.1 7.7 6.6  NEUTROABS 5,605.00  --  4,797.00  --   HGB 13.5 12.6 13.6 13.8  HCT 41 38 41 41  PLT 282 193 214 173   Lab Results  Component Value Date   TSH 2.52 12/07/2020   Lab Results  Component Value Date   HGBA1C 6.0 05/30/2021   Lab Results  Component Value Date   CHOL 120 12/07/2020   HDL 47 12/07/2020   LDLCALC 50 12/07/2020   TRIG 148 12/07/2020    Significant Diagnostic Results in last 30 days:  No results found.  Assessment/Plan 1. Herpes zoster without complication - rash to crown of forehead/ left neck x 1 day - resident next to her diagnosed 10/03 with shingles - start Valtrex 1000 mg po BID x 7 days - airborne precautions  2. Tinea capitis - increased itching, red lesions, greasy appearance to scalp x 1 month - Selsun Blue shampoo unsuccessful - start ketoconazole 1% shampoo- apply to head twice weekly  3. Frontotemporal dementia with behavioral disturbance (HCC) - followed by Palliative - dependent with all ADLs - cont Seroquel, Depakote and ativan  - cont skilled nursing care    Family/ staff Communication: plan discussed with patient and nurse  Labs/tests ordered:  none

## 2021-11-17 DIAGNOSIS — R1312 Dysphagia, oropharyngeal phase: Secondary | ICD-10-CM | POA: Diagnosis not present

## 2021-11-17 DIAGNOSIS — R1310 Dysphagia, unspecified: Secondary | ICD-10-CM | POA: Diagnosis not present

## 2021-11-21 DIAGNOSIS — R1310 Dysphagia, unspecified: Secondary | ICD-10-CM | POA: Diagnosis not present

## 2021-11-21 DIAGNOSIS — R1312 Dysphagia, oropharyngeal phase: Secondary | ICD-10-CM | POA: Diagnosis not present

## 2021-11-22 DIAGNOSIS — R1310 Dysphagia, unspecified: Secondary | ICD-10-CM | POA: Diagnosis not present

## 2021-11-22 DIAGNOSIS — R1312 Dysphagia, oropharyngeal phase: Secondary | ICD-10-CM | POA: Diagnosis not present

## 2021-11-23 ENCOUNTER — Encounter: Payer: Self-pay | Admitting: Orthopedic Surgery

## 2021-11-23 ENCOUNTER — Non-Acute Institutional Stay (SKILLED_NURSING_FACILITY): Payer: Medicare Other | Admitting: Orthopedic Surgery

## 2021-11-23 DIAGNOSIS — F02818 Dementia in other diseases classified elsewhere, unspecified severity, with other behavioral disturbance: Secondary | ICD-10-CM

## 2021-11-23 DIAGNOSIS — B029 Zoster without complications: Secondary | ICD-10-CM

## 2021-11-23 DIAGNOSIS — G3109 Other frontotemporal dementia: Secondary | ICD-10-CM

## 2021-11-23 NOTE — Progress Notes (Signed)
Location:  [facility] Room Number: 29/A Place of Service:  SNF [id number]) Provider:  Octavia Heir, NP   Mahlon Gammon, MD  Patient Care Team: Mahlon Gammon, MD as PCP - General (Internal Medicine)  Extended Emergency Contact Information Primary Emergency Contact: [facility] Phone: [phone] Relation: Sister Secondary Emergency Contact: [facility] Phone: [phone] Mobile Phone: [phone] Relation: Brother  Code Status:  DNR Goals of care: Advanced Directive information    11/03/2021    2:15 PM  Advanced Directives  Does Patient Have a Medical Advance Directive? Yes  Type of Estate agent of [place];Out of facility DNR (pink MOST or yellow form)  Does patient want to make changes to medical advance directive? No - Patient declined  Copy of Healthcare Power of Attorney in Chart? Yes - validated most recent copy scanned in chart (See row information)  Pre-existing out of facility DNR order (yellow form or pink MOST form) Yellow form placed in chart (order not valid for inpatient use)     Chief Complaint  Patient presents with   Acute Visit    Shingles rash     HPI:  Pt is a 71 y.o. female seen today for acute visit due to recent shingles rash.   09/12 tested positive covid. No long term symptoms.   10/04 diagnosed with shingles. Erythematous lesions with blisters located on forehead and under left neck/ear. She was started on valtrex x 7 days. 10/09 airborne precautions discontinued. She is a poor historian due to frontotemporal dementia. No recent behaviors. Does not appear in pain, faces scale 0. She has completed zoster vaccine in past. HPOA requesting Shingrix vaccine after flu and covid booster given.      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  No Known Allergies  Outpatient Encounter Medications as of 11/23/2021  Medication Sig   acetaminophen (TYLENOL) 500 MG tablet Take 1,000 mg by mouth 2 (two) times daily as needed for fever (pain).   AMBULATORY NON FORMULARY MEDICATION peg3350-sod sul-NaCl-KCl-asb-C  powder in packet; 100-7.5-2.691 gram; amt: 1 Capful (17 grams); oral  Special Instructions: Mix 1 capful (17 grams) in 4-8 oz of H2O and drink po daily  as needed for constipation Once A Day - PRN   aspirin 81 MG chewable tablet Chew by mouth daily.   calcium carbonate (OS-CAL) 600 MG TABS tablet Take 600 mg by mouth daily with breakfast.   Camphor-Menthol-Methyl Sal (SALONPAS) 3.02-19-08 % PTCH Place 1 patch onto the skin See admin instructions. Apply one patch transdermally to left aka stump daily as needed for pain   citalopram (CELEXA) 10 MG tablet Take 10 mg by mouth every morning.   Cranberry 450 MG TABS Take by mouth daily.   divalproex (DEPAKOTE) 250 MG DR tablet Take 250 mg by mouth 3 (three) times daily.   Docusate Sodium 100 MG capsule Take 100 mg by mouth 2 (two) times daily.   guaiFENesin (ROBITUSSIN) 100 MG/5ML liquid Take 200 mg by mouth every 4 (four) hours as needed for cough or congestion.   hydrocortisone cream 1 % Apply 1 Application topically 2 (two) times daily.   levothyroxine (SYNTHROID) 50 MCG tablet Take 50 mcg by mouth daily before breakfast.   lisinopril (ZESTRIL) 2.5 MG tablet Take 2.5 mg by mouth  every morning.   loperamide (IMODIUM A-D) 2 MG tablet Take 2 mg by mouth every 8 (eight) hours as needed for diarrhea or loose stools.   LORazepam (ATIVAN) 0.5 MG tablet Take 0.5 mg by mouth 4 (four) times daily. 4 times a day PRN (as needed)   LORazepam (ATIVAN) 2 MG tablet Take 2 mg by mouth. Take 1 tablet as directed by [facility] staff   melatonin 3 MG TABS tablet Take 3 mg by mouth at bedtime.   Multiple Vitamins-Minerals (HEALTHY EYES/LUTEIN-ZEAXANTHIN) CAPS Take 1 capsule by mouth every  morning. DO NOT CRUSH   NYAMYC powder Apply 1 application topically 2 (two) times daily as needed (groin rash).   QUEtiapine (SEROQUEL) 25 MG tablet Take 25 mg by mouth every morning.   QUEtiapine (SEROQUEL) 50 MG tablet Take 50 mg by mouth at bedtime.   simvastatin (ZOCOR) 20 MG tablet Take 20 mg by mouth at bedtime.   sodium chloride 1 g tablet Take 1 g by mouth once.   Sodium Fluoride (PREVIDENT 5000 BOOSTER PLUS) 1.1 % PSTE Place onto teeth daily. Brush on teeth with a toothbrush after PM mouth care. Spit out excess and do not rinse   thiamine 100 MG tablet Take 100 mg by mouth every morning. Vitamin B1   valACYclovir (VALTREX) 1000 MG tablet Take 1 tablet (1,000 mg total) by mouth 2 (two) times daily for 7 days.   vitamin B-12 (CYANOCOBALAMIN) 500 MCG tablet Take 500 mcg by mouth daily.   zinc oxide 20 % ointment Apply 1 application topically 3 (three) times daily as needed (apply to buttocks for redness).   Facility-Administered Encounter Medications as of 11/23/2021  Medication   ketoconazole (NIZORAL) 2 % shampoo    Review of Systems  Unable to perform ROS: Dementia    Immunization History  Administered Date(s) Administered   Influenza-Unspecified 11/11/2019, 11/11/2020   Moderna SARS-COV2 Booster Vaccination 02/02/2021   Moderna Sars-Covid-2 Vaccination 03/06/2019, 04/03/2019, 12/18/2019, 11/11/2020   Pfizer Covid-19 Vaccine Bivalent Booster 64yrs & up 07/27/2021   Pneumococcal Polysaccharide-23 12/09/2013, 11/25/2016   Tdap 03/12/2012   Zoster, Live 11/26/2012   Zoster, Unspecified 11/26/2012   Pertinent  Health Maintenance Due  Topic Date Due   INFLUENZA VACCINE  09/13/2021   HEMOGLOBIN A1C  11/29/2021   FOOT EXAM  03/09/2022   OPHTHALMOLOGY EXAM  06/02/2022   MAMMOGRAM  Discontinued   DEXA SCAN  Discontinued   COLONOSCOPY (Pts 45-72yrs Insurance coverage will need to be confirmed)  Discontinued      10/24/2020    8:00 PM 10/25/2020   11:00 AM 10/25/2020    7:10  PM 10/26/2020   12:00 PM 03/04/2021    2:48 PM  Fall Risk  Falls in the past year?     1  Was there an injury with Fall?     0  Fall Risk Category Calculator     1  Fall Risk Category     Low  Patient Fall Risk Level High fall risk High fall risk High fall risk High fall risk Moderate fall risk  Patient at Risk for Falls Due to     History of fall(s);Impaired balance/gait;Impaired mobility  Fall risk Follow up     Falls evaluation completed;Education provided;Falls prevention discussed   Functional Status Survey:    Vitals:   11/23/21 1203  BP: 103/62  Pulse: 77  Resp: 16  Temp: 97.6 F (36.4 C)  SpO2: 99%  Weight: 152 lb (68.9 kg)  Height: 5'  7" (1.702 m)   Body mass index is 23.81 kg/m. Physical Exam Vitals reviewed.  Constitutional:      General: She is not in acute distress. HENT:     Head: Normocephalic.  Eyes:     General:        Right eye: No discharge.        Left eye: No discharge.  Cardiovascular:     Rate and Rhythm: Normal rate and regular rhythm.     Pulses: Normal pulses.     Heart sounds: Normal heart sounds.  Pulmonary:     Effort: Pulmonary effort is normal. No respiratory distress.     Breath sounds: Normal breath sounds. No wheezing.  Abdominal:     General: Bowel sounds are normal. There is no distension.     Palpations: Abdomen is soft.     Tenderness: There is no abdominal tenderness.  Musculoskeletal:     Cervical back: Neck supple.     Right lower leg: No edema.     Comments: Left BKA  Skin:    General: Skin is warm and dry.     Findings: Rash present.     Comments: Erythematous lesions to forehead/ right ear almost healed, skin appears dry  Neurological:     General: No focal deficit present.     Mental Status: She is alert. Mental status is at baseline.     Motor: Weakness present.     Gait: Gait abnormal.     Comments: wheelchair  Psychiatric:        Cognition and Memory: Cognition is impaired. Memory is impaired.     Comments:  Does not follow commands, nonverbal today, alert to self     Labs reviewed: Recent Labs    06/02/21 0000 08/01/21 0000 09/19/21 0000  NA 140 142 139  K 4.6 4.4 4.2  CL 105 108 106  CO2 28* 25* 23*  BUN 24* 25* 27*  CREATININE 0.6 0.6 0.7  CALCIUM 9.1 9.3 9.2   Recent Labs    12/07/20 0000 04/21/21 0000 06/02/21 0000  AST 12* 14 11*  ALT 8 11 9   ALKPHOS 51 49 40  ALBUMIN 3.5 4.0 3.8   Recent Labs    04/21/21 0000 06/02/21 0000 08/01/21 0000 09/19/21 0000  WBC 8.1 7.1 7.7 6.6  NEUTROABS 5,605.00  --  4,797.00  --   HGB 13.5 12.6 13.6 13.8  HCT 41 38 41 41  PLT 282 193 214 173   Lab Results  Component Value Date   TSH 2.52 12/07/2020   Lab Results  Component Value Date   HGBA1C 6.0 05/30/2021   Lab Results  Component Value Date   CHOL 120 12/07/2020   HDL 47 12/07/2020   LDLCALC 50 12/07/2020   TRIG 148 12/07/2020    Significant Diagnostic Results in last 30 days:  No results found.  Assessment/Plan 1. Herpes zoster without complication - 09/32 shingles outbreak to forehead, under left neck/ear - acyclovir complete 10/11 - does not appear in pain- no behaviors, faces scale 0 - discontinue airborne precautions - Shingrix vaccine- administer after Nov 13th   2. Frontotemporal dementia with behavioral disturbance ([place]) - followed by palliative - no behaviors - dependent with all ADLs - cont Seroquel, Depakote and Ativan - cont skilled nursing    Family/ staff Communication: plan discussed with patient, HPOA, nurse  Labs/tests ordered:  none

## 2021-11-28 ENCOUNTER — Encounter: Payer: Self-pay | Admitting: Orthopedic Surgery

## 2021-11-28 ENCOUNTER — Non-Acute Institutional Stay (SKILLED_NURSING_FACILITY): Payer: Medicare Other | Admitting: Orthopedic Surgery

## 2021-11-28 DIAGNOSIS — G3109 Other frontotemporal dementia: Secondary | ICD-10-CM | POA: Diagnosis not present

## 2021-11-28 DIAGNOSIS — E871 Hypo-osmolality and hyponatremia: Secondary | ICD-10-CM | POA: Diagnosis not present

## 2021-11-28 DIAGNOSIS — F02818 Dementia in other diseases classified elsewhere, unspecified severity, with other behavioral disturbance: Secondary | ICD-10-CM | POA: Diagnosis not present

## 2021-11-28 DIAGNOSIS — E785 Hyperlipidemia, unspecified: Secondary | ICD-10-CM

## 2021-11-28 DIAGNOSIS — N390 Urinary tract infection, site not specified: Secondary | ICD-10-CM | POA: Diagnosis not present

## 2021-11-28 DIAGNOSIS — Z89612 Acquired absence of left leg above knee: Secondary | ICD-10-CM | POA: Diagnosis not present

## 2021-11-28 DIAGNOSIS — F339 Major depressive disorder, recurrent, unspecified: Secondary | ICD-10-CM

## 2021-11-28 DIAGNOSIS — E1151 Type 2 diabetes mellitus with diabetic peripheral angiopathy without gangrene: Secondary | ICD-10-CM

## 2021-11-28 DIAGNOSIS — L219 Seborrheic dermatitis, unspecified: Secondary | ICD-10-CM

## 2021-11-28 DIAGNOSIS — R634 Abnormal weight loss: Secondary | ICD-10-CM | POA: Diagnosis not present

## 2021-11-28 DIAGNOSIS — E039 Hypothyroidism, unspecified: Secondary | ICD-10-CM

## 2021-11-28 DIAGNOSIS — I10 Essential (primary) hypertension: Secondary | ICD-10-CM

## 2021-11-28 NOTE — Progress Notes (Signed)
Location:  [facility] [place] Room Number: [place] of Service:  SNF [phone]) Provider:  Windell Moulding NP  Virgie Dad, MD  Patient Care Team: Virgie Dad, MD as PCP - General (Internal Medicine)  Extended Emergency Contact Information Primary Emergency Contact: [facility] Phone: [phone] Relation: Sister Secondary Emergency Contact: [facility] Phone: [phone] Mobile Phone: [phone] Relation: Brother  Code Status:   Goals of care: Advanced Directive information    11/28/2021   10:07 AM  Advanced Directives  Does Patient Have a Medical Advance Directive? Yes  Type of Paramedic of [place];Out of facility DNR (pink MOST or yellow form)  Does patient want to make changes to medical advance directive? No - Patient declined  Copy of [place] in Chart? Yes - validated most recent copy scanned in chart (See row information)  Pre-existing out of facility DNR order (yellow form or pink MOST form) Yellow form placed in chart (order not valid for inpatient use)     Chief Complaint  Patient presents with   Medical Management of Chronic Issues    Routine    HPI:  Pt is a 71 y.o. female seen today for medical management of chronic diseases.    She currently resides on the skilled nursing unit at [facility] due to frontotemporal dementia. Past medical history includes: HTN, hypothyroidism, T2DM, alcohol abuse, psychosis,  wernicke-korsakoff syndrome, thrombocytopenia.   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, marked severe cerebral atrophy temporal parietal lobes, dependent with all ADLs including feeding, has tics of fake sneezing, remains on Seroquel and Depakote T2DM- A1c 6.0 05/30/2021, sugars 130-140's, no recent hypoglycemia,  Metformin recently discontinued, remains on ace/asa/statin Left BKA- due to acute limb ischemia, ambulates with wheelchair/hoyer Recurrent UTI- H/o ESBL E.coli, 04/26 urine culture > 100,000 cfu/mL of E.coli (ESBL) resolved with Bactrim, remains on cranberry supplement for prevention HTN- BUN/creat 27/0.68 09/19/2021, remains on lisinopril HLD- LDL 50 12/07/2020, remains on statin Hypothyroidism- TSH 2.52 12/07/2020, remains on levothyroxine Hyponatremia- Na 139 09/19/2021, remains on sodium tablets Depression- Lifesource discontinued, remains on Celexa and ativan prn Seborrheic dermatitis- improved with Nizoral shampoo 2x/week  No recent falls or injuries.   Recent Shingles to scalp resolved, Shingrix vaccine scheduled 12/27/2021.   Recent weights:  10/02- 152.1 lbs  09/01- 158.5 lbs  08/01- 158.6 lbs  04/03- 160.5 lbs  Recent blood pressures:  10/10- 103/62  09/19- 115/60         Past Medical History:  Diagnosis Date   Dementia ([place])    Diabetes mellitus without complication ([place])    Hx of BKA, left ([place]) 10/24/2020   Thyroid disease    History reviewed. No pertinent surgical history.  No Known Allergies  Allergies as of 11/28/2021   No Known Allergies      Medication List        Accurate as of November 28, 2021 10:08 AM. If you have any questions, ask your nurse or doctor.          acetaminophen 500 MG tablet Commonly known as: TYLENOL Take 1,000 mg by mouth 2 (two) times daily as needed for fever (pain).   AMBULATORY NON FORMULARY MEDICATION peg3350-sod sul-NaCl-KCl-asb-C  powder in packet; 100-7.5-2.691 gram; amt: 1 Capful (17 grams); oral  Special Instructions: Mix 1 capful (17 grams) in 4-8 oz of H2O and  drink po daily  as needed for constipation Once A Day - PRN   aspirin 81 MG chewable tablet Chew by mouth daily.   calcium carbonate 600 MG Tabs tablet Commonly known as: OS-CAL Take 600 mg by mouth daily with breakfast.   citalopram 10  MG tablet Commonly known as: CELEXA Take 10 mg by mouth every morning.   Cranberry 450 MG Tabs Take by mouth daily.   divalproex 250 MG DR tablet Commonly known as: DEPAKOTE Take 250 mg by mouth 3 (three) times daily.   Docusate Sodium 100 MG capsule Take 100 mg by mouth 2 (two) times daily.   guaiFENesin 100 MG/5ML liquid Commonly known as: ROBITUSSIN Take 200 mg by mouth every 4 (four) hours as needed for cough or congestion.   Healthy Eyes/Lutein-Zeaxanthin Caps Take 1 capsule by mouth every morning. DO NOT CRUSH   hydrocortisone cream 1 % Apply 1 Application topically 2 (two) times daily.   levothyroxine 50 MCG tablet Commonly known as: SYNTHROID Take 50 mcg by mouth daily before breakfast.   lisinopril 2.5 MG tablet Commonly known as: ZESTRIL Take 2.5 mg by mouth every morning.   loperamide 2 MG tablet Commonly known as: IMODIUM A-D Take 2 mg by mouth every 8 (eight) hours as needed for diarrhea or loose stools.   LORazepam 2 MG tablet Commonly known as: ATIVAN Take 2 mg by mouth. Take 1 tablet as directed by [facility] staff   LORazepam 0.5 MG tablet Commonly known as: ATIVAN Take 0.5 mg by mouth 4 (four) times daily. 4 times a day PRN (as needed)   melatonin 3 MG Tabs tablet Take 3 mg by mouth at bedtime.   Nyamyc powder Generic drug: nystatin Apply 1 application topically 2 (two) times daily as needed (groin rash).   PreviDent 5000 Booster Plus 1.1 % Pste Generic drug: Sodium Fluoride Place onto teeth daily. Brush on teeth with a toothbrush after PM mouth care. Spit out excess and do not rinse   QUEtiapine 50 MG tablet Commonly known as: SEROQUEL Take 50 mg by mouth at bedtime.   QUEtiapine 25 MG tablet Commonly known as: SEROQUEL Take 25 mg by mouth every morning.   Salonpas 3.02-19-08 % Ptch Generic drug: Camphor-Menthol-Methyl Sal Place 1 patch onto the skin See admin instructions. Apply one patch transdermally to left aka stump daily  as needed for pain   simvastatin 20 MG tablet Commonly known as: ZOCOR Take 20 mg by mouth at bedtime.   sodium chloride 1 g tablet Take 1 g by mouth once.   thiamine 100 MG tablet Commonly known as: VITAMIN B1 Take 100 mg by mouth every morning. Vitamin B1   vitamin B-12 500 MCG tablet Commonly known as: CYANOCOBALAMIN Take 500 mcg by mouth daily.   zinc oxide 20 % ointment Apply 1 application topically 3 (three) times daily as needed (apply to buttocks for redness).        Review of Systems  Unable to perform ROS: Patient nonverbal    Immunization History  Administered Date(s) Administered   Influenza-Unspecified 11/11/2019, 11/11/2020   Moderna SARS-COV2 Booster Vaccination 02/02/2021   Moderna Sars-Covid-2 Vaccination 03/06/2019, 04/03/2019, 12/18/2019, 11/11/2020   PPD Test 05/05/2019   Pfizer Covid-19 Vaccine Bivalent Booster 58yrs & up 07/27/2021   Pneumococcal Polysaccharide-23 12/09/2013, 11/25/2016   Tdap 03/12/2012   Zoster, Live 11/26/2012   Zoster, Unspecified 11/26/2012   Pertinent  Health Maintenance Due  Topic Date Due   INFLUENZA VACCINE  09/13/2021  HEMOGLOBIN A1C  11/29/2021   FOOT EXAM  03/09/2022   OPHTHALMOLOGY EXAM  06/02/2022   MAMMOGRAM  Discontinued   DEXA SCAN  Discontinued   COLONOSCOPY (Pts 45-30yrs Insurance coverage will need to be confirmed)  Discontinued      10/24/2020    8:00 PM 10/25/2020   11:00 AM 10/25/2020    7:10 PM 10/26/2020   12:00 PM 03/04/2021    2:48 PM  Fall Risk  Falls in the past year?     1  Was there an injury with Fall?     0  Fall Risk Category Calculator     1  Fall Risk Category     Low  Patient Fall Risk Level High fall risk High fall risk High fall risk High fall risk Moderate fall risk  Patient at Risk for Falls Due to     History of fall(s);Impaired balance/gait;Impaired mobility  Fall risk Follow up     Falls evaluation completed;Education provided;Falls prevention discussed   Functional Status  Survey:    Vitals:   11/28/21 0958  BP: 103/62  Pulse: 77  Resp: 16  Temp: 97.6 F (36.4 C)  SpO2: 99%  Weight: 152 lb 1.6 oz (69 kg)  Height: 5\' 7"  (1.702 m)   Body mass index is 23.82 kg/m. Physical Exam Vitals reviewed.  Constitutional:      General: She is not in acute distress. HENT:     Head: Normocephalic.     Right Ear: There is no impacted cerumen.     Left Ear: There is no impacted cerumen.     Nose: Nose normal.     Mouth/Throat:     Mouth: Mucous membranes are moist.  Eyes:     General:        Right eye: No discharge.        Left eye: No discharge.  Cardiovascular:     Rate and Rhythm: Normal rate and regular rhythm.     Pulses: Normal pulses.     Heart sounds: Normal heart sounds.  Pulmonary:     Effort: Pulmonary effort is normal. No respiratory distress.     Breath sounds: Normal breath sounds. No wheezing.  Abdominal:     General: Bowel sounds are normal. There is no distension.     Palpations: Abdomen is soft.     Tenderness: There is no abdominal tenderness.  Musculoskeletal:     Cervical back: Neck supple.     Right lower leg: No edema.     Comments: Left BKA  Skin:    General: Skin is warm and dry.     Capillary Refill: Capillary refill takes less than 2 seconds.  Neurological:     General: No focal deficit present.     Mental Status: She is alert. Mental status is at baseline.     Motor: Weakness present.     Gait: Gait abnormal.     Comments: Hoyer/wheelchair  Psychiatric:        Mood and Affect: Mood normal.        Behavior: Behavior normal.     Comments: Nonverbal, does not follow commands, alert to self     Labs reviewed: Recent Labs    06/02/21 0000 08/01/21 0000 09/19/21 0000  NA 140 142 139  K 4.6 4.4 4.2  CL 105 108 106  CO2 28* 25* 23*  BUN 24* 25* 27*  CREATININE 0.6 0.6 0.7  CALCIUM 9.1 9.3 9.2   Recent Labs  12/07/20 0000 04/21/21 0000 06/02/21 0000  AST 12* 14 11*  ALT 8 11 9   ALKPHOS 51 49 40   ALBUMIN 3.5 4.0 3.8   Recent Labs    04/21/21 0000 06/02/21 0000 08/01/21 0000 09/19/21 0000  WBC 8.1 7.1 7.7 6.6  NEUTROABS 5,605.00  --  4,797.00  --   HGB 13.5 12.6 13.6 13.8  HCT 41 38 41 41  PLT 282 193 214 173   Lab Results  Component Value Date   TSH 2.52 12/07/2020   Lab Results  Component Value Date   HGBA1C 6.0 05/30/2021   Lab Results  Component Value Date   CHOL 120 12/07/2020   HDL 47 12/07/2020   LDLCALC 50 12/07/2020   TRIG 148 12/07/2020    Significant Diagnostic Results in last 30 days:  No results found.  Assessment/Plan 1. Weight loss - down 8 lbs in 6 months - cont monthly weights  2. Frontotemporal dementia with behavioral disturbance (HCC) - followed by palliative - dependent with all ADLs including feeding - no behaviors  - cont Seroquel, Depakote and Ativan - valproic acid level  3. Type 2 diabetes mellitus with peripheral vascular disease (HCC) - A1c stable - no hypoglycemias - A1c- future - cont ACE/asa/statin  4. S/P AKA (above knee amputation), left (HCC) - cont SNF  5. Recurrent UTI - cont cranberry supplement  6. Essential hypertension - controlled with lisinopril  7. Hyperlipidemia, unspecified hyperlipidemia type - cont simvastatin - lipid panel- future  8. Acquired hypothyroidism - TSH stable - TSH- future  9. Hyponatremia - Na+ stable  10. Depression, recurrent (HCC) - no mood changes - Lifesource discontinued - cont Celexa   11. Seborrheic dermatitis of scalp - improved with Nizoral    Family/ staff Communication: plan discussed with patient and nurse  Labs/tests ordered:  A1c, TSH, Valproic acid, lipid panel

## 2021-12-01 DIAGNOSIS — E785 Hyperlipidemia, unspecified: Secondary | ICD-10-CM | POA: Diagnosis not present

## 2021-12-01 DIAGNOSIS — E119 Type 2 diabetes mellitus without complications: Secondary | ICD-10-CM | POA: Diagnosis not present

## 2021-12-01 DIAGNOSIS — E079 Disorder of thyroid, unspecified: Secondary | ICD-10-CM | POA: Diagnosis not present

## 2021-12-23 ENCOUNTER — Encounter: Payer: Self-pay | Admitting: Orthopedic Surgery

## 2021-12-23 ENCOUNTER — Non-Acute Institutional Stay (SKILLED_NURSING_FACILITY): Payer: Medicare Other | Admitting: Orthopedic Surgery

## 2021-12-23 DIAGNOSIS — E1151 Type 2 diabetes mellitus with diabetic peripheral angiopathy without gangrene: Secondary | ICD-10-CM

## 2021-12-23 DIAGNOSIS — N390 Urinary tract infection, site not specified: Secondary | ICD-10-CM

## 2021-12-23 DIAGNOSIS — E785 Hyperlipidemia, unspecified: Secondary | ICD-10-CM | POA: Diagnosis not present

## 2021-12-23 DIAGNOSIS — F02818 Dementia in other diseases classified elsewhere, unspecified severity, with other behavioral disturbance: Secondary | ICD-10-CM

## 2021-12-23 DIAGNOSIS — E039 Hypothyroidism, unspecified: Secondary | ICD-10-CM | POA: Diagnosis not present

## 2021-12-23 DIAGNOSIS — G3109 Other frontotemporal dementia: Secondary | ICD-10-CM

## 2021-12-23 DIAGNOSIS — Z89612 Acquired absence of left leg above knee: Secondary | ICD-10-CM | POA: Diagnosis not present

## 2021-12-23 DIAGNOSIS — R634 Abnormal weight loss: Secondary | ICD-10-CM | POA: Diagnosis not present

## 2021-12-23 DIAGNOSIS — E871 Hypo-osmolality and hyponatremia: Secondary | ICD-10-CM | POA: Diagnosis not present

## 2021-12-23 DIAGNOSIS — I10 Essential (primary) hypertension: Secondary | ICD-10-CM

## 2021-12-23 DIAGNOSIS — F339 Major depressive disorder, recurrent, unspecified: Secondary | ICD-10-CM

## 2021-12-23 NOTE — Progress Notes (Signed)
Location:  [facility] Room Number: 29/A Place of Service:  SNF [phone]) Provider:  Octavia Heir, NP   Mahlon Gammon, MD  Patient Care Team: Mahlon Gammon, MD as PCP - General (Internal Medicine)  Extended Emergency Contact Information Primary Emergency Contact: [facility] Phone: [phone] Relation: Sister Secondary Emergency Contact: [facility] Phone: [phone] Mobile Phone: [phone] Relation: Brother  Code Status:  DNR Goals of care: Advanced Directive information    11/28/2021   10:07 AM  Advanced Directives  Does Patient Have a Medical Advance Directive? Yes  Type of Estate agent of [place];Out of facility DNR (pink MOST or yellow form)  Does patient want to make changes to medical advance directive? No - Patient declined  Copy of Healthcare Power of Attorney in Chart? Yes - validated most recent copy scanned in chart (See row information)  Pre-existing out of facility DNR order (yellow form or pink MOST form) Yellow form placed in chart (order not valid for inpatient use)     Chief Complaint  Patient presents with   Medical Management of Chronic Issues    HPI:  Pt is a 71 y.o. female seen today for medical management of chronic diseases.    She currently resides on the skilled nursing unit at [facility] due to frontotemporal dementia. PMH: HTN, hypothyroidism, T2DM, alcohol abuse, psychosis, wernicke-korsakoff syndrome, thrombocytopenia.    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, marked severe cerebral atrophy temporal parietal lobes, dependent with all ADLs including feeding, tics of fake sneezing has reduced- now nonverbal most of time, remains on Seroquel and Depakote Weight loss- see below, assisted feedings with CNA, eating about 50% of meals T2DM- A1c 6.0 05/30/2021, sugars  130-140's, no recent hypoglycemia, Metformin recently discontinued, remains on ace/asa/statin Left BKA- due to acute limb ischemia, ambulates with wheelchair/hoyer Recurrent UTI- H/o ESBL E.coli, 04/26 urine culture > 100,000 cfu/mL of E.coli (ESBL) resolved with Bactrim, remains on cranberry supplement for prevention HTN- BUN/creat 27/0.68 09/19/2021, remains on lisinopril HLD- LDL 50 12/07/2020, remains on statin Hypothyroidism- TSH 2.52 12/07/2020, remains on levothyroxine Hyponatremia- Na 139 09/19/2021, remains on sodium tablets Depression- remains on Celexa and ativan prn  No recent falls or injuries.   10/25 received flu vaccine, tolerated well. 11/07 received covid vaccine, tolerated well.   Recent blood pressures:   11/07- 96/51  10/31- 118/76  10/24- 120/78  Recent weights:  11/01- 150 lbs  10/02- 152.1 lbs  09/01- 158.5 lbs   No past surgical history on file.  No Known Allergies  Outpatient Encounter Medications as of 12/23/2021  Medication Sig   acetaminophen (TYLENOL) 500 MG tablet Take 1,000 mg by mouth 2 (two) times daily as needed for fever (pain).   AMBULATORY NON FORMULARY MEDICATION peg3350-sod sul-NaCl-KCl-asb-C  powder in packet; 100-7.5-2.691 gram; amt: 1 Capful (17 grams); oral  Special Instructions: Mix 1 capful (17 grams) in 4-8 oz of H2O and drink po daily  as needed for constipation Once A Day - PRN   aspirin 81 MG chewable tablet Chew by mouth daily.   calcium carbonate (OS-CAL) 600 MG TABS tablet Take 600 mg by mouth daily with breakfast.   Camphor-Menthol-Methyl Sal (SALONPAS) 3.02-19-08 % PTCH Place 1 patch onto the skin See admin instructions. Apply one patch transdermally to left aka stump daily as needed for pain   citalopram (CELEXA) 10 MG tablet Take 10 mg by mouth every  morning.   Cranberry 450 MG TABS Take by mouth daily.   divalproex (DEPAKOTE) 250 MG DR tablet Take 250 mg by mouth 3 (three) times daily.   Docusate Sodium 100 MG capsule  Take 100 mg by mouth 2 (two) times daily.   guaiFENesin (ROBITUSSIN) 100 MG/5ML liquid Take 200 mg by mouth every 4 (four) hours as needed for cough or congestion.   hydrocortisone cream 1 % Apply 1 Application topically 2 (two) times daily.   levothyroxine (SYNTHROID) 50 MCG tablet Take 50 mcg by mouth daily before breakfast.   lisinopril (ZESTRIL) 2.5 MG tablet Take 2.5 mg by mouth every morning.   loperamide (IMODIUM A-D) 2 MG tablet Take 2 mg by mouth every 8 (eight) hours as needed for diarrhea or loose stools.   LORazepam (ATIVAN) 0.5 MG tablet Take 0.5 mg by mouth 4 (four) times daily. 4 times a day PRN (as needed)   LORazepam (ATIVAN) 2 MG tablet Take 2 mg by mouth. Take 1 tablet as directed by [facility] staff   melatonin 3 MG TABS tablet Take 3 mg by mouth at bedtime.   Multiple Vitamins-Minerals (HEALTHY EYES/LUTEIN-ZEAXANTHIN) CAPS Take 1 capsule by mouth every morning. DO NOT CRUSH   NYAMYC powder Apply 1 application topically 2 (two) times daily as needed (groin rash).   QUEtiapine (SEROQUEL) 25 MG tablet Take 25 mg by mouth every morning.   QUEtiapine (SEROQUEL) 50 MG tablet Take 50 mg by mouth at bedtime.   simvastatin (ZOCOR) 20 MG tablet Take 20 mg by mouth at bedtime.   sodium chloride 1 g tablet Take 1 g by mouth once.   Sodium Fluoride (PREVIDENT 5000 BOOSTER PLUS) 1.1 % PSTE Place onto teeth daily. Brush on teeth with a toothbrush after PM mouth care. Spit out excess and do not rinse   thiamine 100 MG tablet Take 100 mg by mouth every morning. Vitamin B1   vitamin B-12 (CYANOCOBALAMIN) 500 MCG tablet Take 500 mcg by mouth daily.   zinc oxide 20 % ointment Apply 1 application topically 3 (three) times daily as needed (apply to buttocks for redness).   Facility-Administered Encounter Medications as of 12/23/2021  Medication   ketoconazole (NIZORAL) 2 % shampoo    Review of Systems  Unable to perform ROS: Dementia    Immunization History  Administered Date(s)  Administered   Fluad Quad(high Dose 65+) 12/07/2021   Influenza-Unspecified 11/11/2019, 11/11/2020   Moderna SARS-COV2 Booster Vaccination 02/02/2021   Moderna Sars-Covid-2 Vaccination 03/06/2019, 04/03/2019, 12/18/2019, 11/11/2020   PPD Test 05/05/2019   Pfizer Covid-19 Vaccine Bivalent Booster 78yrs & up 07/27/2021   Pneumococcal Polysaccharide-23 12/09/2013, 11/25/2016   Tdap 03/12/2012   Zoster, Live 11/26/2012   Zoster, Unspecified 11/26/2012   Pertinent  Health Maintenance Due  Topic Date Due   HEMOGLOBIN A1C  11/29/2021   FOOT EXAM  03/09/2022   OPHTHALMOLOGY EXAM  06/02/2022   INFLUENZA VACCINE  Completed   MAMMOGRAM  Discontinued   DEXA SCAN  Discontinued   COLONOSCOPY (Pts 45-51yrs Insurance coverage will need to be confirmed)  Discontinued      10/24/2020    8:00 PM 10/25/2020   11:00 AM 10/25/2020    7:10 PM 10/26/2020   12:00 PM 03/04/2021    2:48 PM  Fall Risk  Falls in the past year?     1  Was there an injury with Fall?     0  Fall Risk Category Calculator     1  Fall Risk Category  Low  Patient Fall Risk Level High fall risk High fall risk High fall risk High fall risk Moderate fall risk  Patient at Risk for Falls Due to     History of fall(s);Impaired balance/gait;Impaired mobility  Fall risk Follow up     Falls evaluation completed;Education provided;Falls prevention discussed   Functional Status Survey:    Vitals:   12/23/21 1222  BP: 95/61  Pulse: 82  Resp: 17  Temp: 97.9 F (36.6 C)  SpO2: 96%  Weight: 150 lb (68 kg)  Height: 5\' 7"  (1.702 m)   Body mass index is 23.49 kg/m. Physical Exam Vitals reviewed.  Constitutional:      General: She is not in acute distress. HENT:     Head: Normocephalic.     Right Ear: There is no impacted cerumen.     Left Ear: There is no impacted cerumen.     Nose: Nose normal.     Mouth/Throat:     Mouth: Mucous membranes are moist.  Eyes:     General:        Right eye: No discharge.        Left eye:  No discharge.  Cardiovascular:     Rate and Rhythm: Normal rate and regular rhythm.     Pulses: Normal pulses.     Heart sounds: Normal heart sounds.  Pulmonary:     Effort: Pulmonary effort is normal. No respiratory distress.     Breath sounds: Normal breath sounds. No wheezing.  Abdominal:     General: Bowel sounds are normal. There is no distension.     Palpations: Abdomen is soft.     Tenderness: There is no abdominal tenderness.  Musculoskeletal:     Cervical back: Neck supple.     Right lower leg: No edema.     Comments: LBKA  Skin:    General: Skin is warm and dry.     Capillary Refill: Capillary refill takes less than 2 seconds.  Neurological:     General: No focal deficit present.     Mental Status: She is alert. Mental status is at baseline.     Motor: Weakness present.     Gait: Gait abnormal.  Psychiatric:        Mood and Affect: Mood normal.        Behavior: Behavior normal.     Comments: Nonverbal, does not follow commands, alert to self     Labs reviewed: Recent Labs    06/02/21 0000 08/01/21 0000 09/19/21 0000  NA 140 142 139  K 4.6 4.4 4.2  CL 105 108 106  CO2 28* 25* 23*  BUN 24* 25* 27*  CREATININE 0.6 0.6 0.7  CALCIUM 9.1 9.3 9.2   Recent Labs    04/21/21 0000 06/02/21 0000  AST 14 11*  ALT 11 9  ALKPHOS 49 40  ALBUMIN 4.0 3.8   Recent Labs    04/21/21 0000 06/02/21 0000 08/01/21 0000 09/19/21 0000  WBC 8.1 7.1 7.7 6.6  NEUTROABS 5,605.00  --  4,797.00  --   HGB 13.5 12.6 13.6 13.8  HCT 41 38 41 41  PLT 282 193 214 173   Lab Results  Component Value Date   TSH 2.52 12/07/2020   Lab Results  Component Value Date   HGBA1C 6.0 05/30/2021   Lab Results  Component Value Date   CHOL 120 12/07/2020   HDL 47 12/07/2020   LDLCALC 50 12/07/2020   TRIG 148 12/07/2020  Significant Diagnostic Results in last 30 days:  No results found.  Assessment/Plan 1. Frontotemporal dementia with behavioral disturbance (HCC) - eating  less, tics occurring less often, mainly nonverbal - dependent with all ADLs - cont Seroquel and Depakote - cont skilled nursing  2. Weight loss - see above - down 8 lbs in 2 months - cont assisted feedings - cont monthly weights  3. Type 2 diabetes mellitus with peripheral vascular disease (HCC) - A1c stable - no hypoglycemias - A1c- future - urine microalbumin- future - cont ACE/asa/statin  4. S/P AKA (above knee amputation), left (HCC) - cont skilled nursing care  5. Recurrent UTI - cont cranberry supplement - encourage fluids  6. Essential hypertension - controlled - cont lisinopril  7. Hyperlipidemia, unspecified hyperlipidemia type - cont statin - lipid panel- future  8. Acquired hypothyroidism - TSH stable - TSH- future  9. Hyponatremia - stable  10. Depression, recurrent (HCC) - no mood changes - cont Celexa    Family/ staff Communication: plan discussed with patient and nurse  Labs/tests ordered:  Lipid panel, A1c, urine microalbumin, TSH 12/26/2021

## 2021-12-26 DIAGNOSIS — E119 Type 2 diabetes mellitus without complications: Secondary | ICD-10-CM | POA: Diagnosis not present

## 2021-12-26 DIAGNOSIS — E039 Hypothyroidism, unspecified: Secondary | ICD-10-CM | POA: Diagnosis not present

## 2021-12-26 DIAGNOSIS — E785 Hyperlipidemia, unspecified: Secondary | ICD-10-CM | POA: Diagnosis not present

## 2021-12-26 LAB — LIPID PANEL
Cholesterol: 188 (ref 0–200)
HDL: 60 (ref 35–70)
LDL Cholesterol: 106
LDl/HDL Ratio: 3.1
Triglycerides: 119 (ref 40–160)

## 2021-12-26 LAB — TSH: TSH: 3.19 (ref 0.41–5.90)

## 2021-12-26 LAB — HEMOGLOBIN A1C: Hemoglobin A1C: 6.5

## 2022-01-10 ENCOUNTER — Non-Acute Institutional Stay (SKILLED_NURSING_FACILITY): Payer: Medicare Other | Admitting: Adult Health

## 2022-01-10 ENCOUNTER — Encounter: Payer: Self-pay | Admitting: Adult Health

## 2022-01-10 DIAGNOSIS — E1151 Type 2 diabetes mellitus with diabetic peripheral angiopathy without gangrene: Secondary | ICD-10-CM

## 2022-01-10 DIAGNOSIS — F418 Other specified anxiety disorders: Secondary | ICD-10-CM

## 2022-01-10 DIAGNOSIS — E785 Hyperlipidemia, unspecified: Secondary | ICD-10-CM | POA: Diagnosis not present

## 2022-01-10 DIAGNOSIS — E039 Hypothyroidism, unspecified: Secondary | ICD-10-CM | POA: Diagnosis not present

## 2022-01-10 DIAGNOSIS — Z89612 Acquired absence of left leg above knee: Secondary | ICD-10-CM

## 2022-01-10 DIAGNOSIS — G3109 Other frontotemporal dementia: Secondary | ICD-10-CM

## 2022-01-10 DIAGNOSIS — F02818 Dementia in other diseases classified elsewhere, unspecified severity, with other behavioral disturbance: Secondary | ICD-10-CM

## 2022-01-10 NOTE — Progress Notes (Signed)
Location:  [facility] Room Number: 29 A Place of Service:  SNF (31) Provider:  Kenard Gower, DNP, FNP-BC  Patient Care Team: Mahlon Gammon, MD as PCP - General (Internal Medicine)  Extended Emergency Contact Information Primary Emergency Contact: [facility] Phone: [phone] Relation: Sister Secondary Emergency Contact: [facility] Phone: [phone] Mobile Phone: [phone] Relation: Brother  Code Status:  DNR  Goals of care: Advanced Directive information    11/28/2021   10:07 AM  Advanced Directives  Does Patient Have a Medical Advance Directive? Yes  Type of Estate agent of [place];Out of facility DNR (pink MOST or yellow form)  Does patient want to make changes to medical advance directive? No - Patient declined  Copy of Healthcare Power of Attorney in Chart? Yes - validated most recent copy scanned in chart (See row information)  Pre-existing out of facility DNR order (yellow form or pink MOST form) Yellow form placed in chart (order not valid for inpatient use)     Chief Complaint  Patient presents with   Acute Visit    Sister wanting to shift from palliative to hospice care    HPI:  Pt is a 71 y.o. female seen today for an acute visit regarding sister wanting to have hospice care now. Patient has a PMH of dementia, Warnicke Korsakoff syndrome, hypertension, hyperlipidemia, hypothyroidism, diabetes mellitus, left BKA (s/p acute limb ischemia) and remote history of alcohol abuse. Sister, Dara Lords, was at bedside when seen. Resident is nonverbal and has poor oral intake. Latest weight is 150 lbs, and has been having progressive weight loss.     Wt Readings from Last 3 Encounters:  01/10/22 150 lb (68 kg)  12/23/21 150 lb (68 kg)  11/28/21 152 lb 1.6 oz (69 kg)    Past Medical History:  Diagnosis Date   Dementia (HCC)    Diabetes mellitus without complication (HCC)    Hx of  BKA, left (HCC) 10/24/2020   Thyroid disease    No past surgical history on file.  No Known Allergies  Outpatient Encounter Medications as of 01/10/2022  Medication Sig   acetaminophen (TYLENOL) 500 MG tablet Take 1,000 mg by mouth 2 (two) times daily as needed for fever (pain).   AMBULATORY NON FORMULARY MEDICATION peg3350-sod sul-NaCl-KCl-asb-C  powder in packet; 100-7.5-2.691 gram; amt: 1 Capful (17 grams); oral  Special Instructions: Mix 1 capful (17 grams) in 4-8 oz of H2O and drink po daily  as needed for constipation Once A Day - PRN   aspirin 81 MG chewable tablet Chew by mouth daily.   calcium carbonate (OS-CAL) 600 MG TABS tablet Take 600 mg by mouth daily with breakfast.   Camphor-Menthol-Methyl Sal (SALONPAS) 3.02-19-08 % PTCH Place 1 patch onto the skin See admin instructions. Apply one patch transdermally to left aka stump daily as needed for pain   citalopram (CELEXA) 10 MG tablet Take 10 mg by mouth every morning.   Cranberry 450 MG TABS Take by mouth daily.   divalproex (DEPAKOTE) 250 MG DR tablet Take 250 mg by mouth 3 (three) times daily.   Docusate Sodium 100 MG capsule Take 100 mg by mouth 2 (two) times daily.   guaiFENesin (ROBITUSSIN) 100 MG/5ML liquid Take 200 mg by mouth every 4 (four) hours as needed for cough or congestion.   hydrocortisone cream 1 % Apply 1 Application topically 2 (two) times daily.   levothyroxine (SYNTHROID) 50 MCG tablet Take 50 mcg by mouth daily before breakfast.  lisinopril (ZESTRIL) 2.5 MG tablet Take 2.5 mg by mouth every morning.   loperamide (IMODIUM A-D) 2 MG tablet Take 2 mg by mouth every 8 (eight) hours as needed for diarrhea or loose stools.   LORazepam (ATIVAN) 0.5 MG tablet Take 0.5 mg by mouth 4 (four) times daily. 4 times a day PRN (as needed)   LORazepam (ATIVAN) 2 MG tablet Take 2 mg by mouth. Take 1 tablet as directed by [facility] staff   melatonin 3 MG TABS tablet Take 3 mg by mouth at bedtime.   Multiple  Vitamins-Minerals (HEALTHY EYES/LUTEIN-ZEAXANTHIN) CAPS Take 1 capsule by mouth every morning. DO NOT CRUSH   NYAMYC powder Apply 1 application topically 2 (two) times daily as needed (groin rash).   QUEtiapine (SEROQUEL) 25 MG tablet Take 25 mg by mouth every morning.   QUEtiapine (SEROQUEL) 50 MG tablet Take 50 mg by mouth at bedtime.   simvastatin (ZOCOR) 20 MG tablet Take 20 mg by mouth at bedtime.   sodium chloride 1 g tablet Take 1 g by mouth once.   Sodium Fluoride (PREVIDENT 5000 BOOSTER PLUS) 1.1 % PSTE Place onto teeth daily. Brush on teeth with a toothbrush after PM mouth care. Spit out excess and do not rinse   thiamine 100 MG tablet Take 100 mg by mouth every morning. Vitamin B1   vitamin B-12 (CYANOCOBALAMIN) 500 MCG tablet Take 500 mcg by mouth daily.   zinc oxide 20 % ointment Apply 1 application topically 3 (three) times daily as needed (apply to buttocks for redness).   Facility-Administered Encounter Medications as of 01/10/2022  Medication   ketoconazole (NIZORAL) 2 % shampoo    Review of Systems   Unable to obtain due to dementia.    Immunization History  Administered Date(s) Administered   Fluad Quad(high Dose 65+) 12/07/2021   Influenza-Unspecified 11/11/2019, 11/11/2020   Moderna SARS-COV2 Booster Vaccination 02/02/2021   Moderna Sars-Covid-2 Vaccination 03/06/2019, 04/03/2019, 12/18/2019, 11/11/2020   PPD Test 05/05/2019   Pfizer Covid-19 Vaccine Bivalent Booster 71yrs & up 07/27/2021   Pneumococcal Polysaccharide-23 12/09/2013, 11/25/2016   Tdap 03/12/2012   Zoster, Live 11/26/2012   Zoster, Unspecified 11/26/2012   Pertinent  Health Maintenance Due  Topic Date Due   HEMOGLOBIN A1C  11/29/2021   FOOT EXAM  03/09/2022   OPHTHALMOLOGY EXAM  06/02/2022   INFLUENZA VACCINE  Completed   MAMMOGRAM  Discontinued   DEXA SCAN  Discontinued   COLONOSCOPY (Pts 45-21yrs Insurance coverage will need to be confirmed)  Discontinued      10/24/2020    8:00 PM  10/25/2020   11:00 AM 10/25/2020    7:10 PM 10/26/2020   12:00 PM 03/04/2021    2:48 PM  Fall Risk  Falls in the past year?     1  Was there an injury with Fall?     0  Fall Risk Category Calculator     1  Fall Risk Category     Low  Patient Fall Risk Level High fall risk High fall risk High fall risk High fall risk Moderate fall risk  Patient at Risk for Falls Due to     History of fall(s);Impaired balance/gait;Impaired mobility  Fall risk Follow up     Falls evaluation completed;Education provided;Falls prevention discussed     Vitals:   01/10/22 1343  BP: 125/71  Pulse: 77  Resp: 18  Temp: 97.6 F (36.4 C)  SpO2: 99%  Weight: 150 lb (68 kg)  Height: 5\' 5"  (1.651  m)   Body mass index is 24.96 kg/m.  Physical Exam Constitutional:      General: She is not in acute distress.    Appearance: Normal appearance.  HENT:     Head: Normocephalic and atraumatic.     Nose: Nose normal.     Mouth/Throat:     Mouth: Mucous membranes are moist.  Eyes:     Conjunctiva/sclera: Conjunctivae normal.  Cardiovascular:     Rate and Rhythm: Normal rate and regular rhythm.  Pulmonary:     Effort: Pulmonary effort is normal.     Breath sounds: Normal breath sounds.  Abdominal:     General: Bowel sounds are normal.     Palpations: Abdomen is soft.  Musculoskeletal:     Cervical back: Normal range of motion.     Comments: Old left AKA  Skin:    General: Skin is warm and dry.  Neurological:     Mental Status: Mental status is at baseline.     Comments: Nonverbal.  Psychiatric:        Mood and Affect: Mood normal.        Behavior: Behavior normal.        Labs reviewed: Recent Labs    06/02/21 0000 08/01/21 0000 09/19/21 0000  NA 140 142 139  K 4.6 4.4 4.2  CL 105 108 106  CO2 28* 25* 23*  BUN 24* 25* 27*  CREATININE 0.6 0.6 0.7  CALCIUM 9.1 9.3 9.2   Recent Labs    04/21/21 0000 06/02/21 0000  AST 14 11*  ALT 11 9  ALKPHOS 49 40  ALBUMIN 4.0 3.8   Recent Labs     04/21/21 0000 06/02/21 0000 08/01/21 0000 09/19/21 0000  WBC 8.1 7.1 7.7 6.6  NEUTROABS 5,605.00  --  4,797.00  --   HGB 13.5 12.6 13.6 13.8  HCT 41 38 41 41  PLT 282 193 214 173   Lab Results  Component Value Date   TSH 2.52 12/07/2020   Lab Results  Component Value Date   HGBA1C 6.0 05/30/2021   Lab Results  Component Value Date   CHOL 120 12/07/2020   HDL 47 12/07/2020   LDLCALC 50 12/07/2020   TRIG 148 12/07/2020    Significant Diagnostic Results in last 30 days:  No results found.  Assessment/Plan  1. Frontotemporal dementia with behavioral disturbance ([place]) -  non-verbal -  has been having progressive weight loss -  continue Divalproex and Seroquel -   hospice consult per sister request  2. Acquired hypothyroidism Lab Results  Component Value Date   TSH 2.52 12/07/2020   -  continue Levothyroxine  3. Hyperlipidemia, unspecified hyperlipidemia type Lab Results  Component Value Date   CHOL 120 12/07/2020   HDL 47 12/07/2020   LDLCALC 50 12/07/2020   TRIG 148 12/07/2020   -  continue Simvastatin  4. Depression with anxiety -  stable -  continue Citalopram and Ativan PRN  5. Type 2 diabetes mellitus with peripheral vascular disease ([place]) Lab Results  Component Value Date   HGBA1C 6.0 05/30/2021   -  diet-controlled  6. S/P AKA (above knee amputation), left ([place]) -  needing full care with ADLs -  fall precautions   Family/ staff Communication: Discussed plan of care with sister and charge nurse.  Labs/tests ordered: None    Durenda Age, DNP, MSN, FNP-BC [facility] and [facility] [phone] (Monday-Friday 8:00 a.m. - 5:00 p.m.) [phone] (after hours)

## 2022-01-19 ENCOUNTER — Encounter: Payer: Self-pay | Admitting: Internal Medicine

## 2022-01-19 ENCOUNTER — Non-Acute Institutional Stay (SKILLED_NURSING_FACILITY): Admitting: Internal Medicine

## 2022-01-19 DIAGNOSIS — Z89612 Acquired absence of left leg above knee: Secondary | ICD-10-CM

## 2022-01-19 DIAGNOSIS — G3109 Other frontotemporal dementia: Secondary | ICD-10-CM

## 2022-01-19 DIAGNOSIS — F02818 Dementia in other diseases classified elsewhere, unspecified severity, with other behavioral disturbance: Secondary | ICD-10-CM

## 2022-01-19 DIAGNOSIS — R634 Abnormal weight loss: Secondary | ICD-10-CM

## 2022-01-19 DIAGNOSIS — E039 Hypothyroidism, unspecified: Secondary | ICD-10-CM

## 2022-01-19 DIAGNOSIS — E1151 Type 2 diabetes mellitus with diabetic peripheral angiopathy without gangrene: Secondary | ICD-10-CM | POA: Diagnosis not present

## 2022-01-19 DIAGNOSIS — E785 Hyperlipidemia, unspecified: Secondary | ICD-10-CM | POA: Diagnosis not present

## 2022-01-19 DIAGNOSIS — F418 Other specified anxiety disorders: Secondary | ICD-10-CM | POA: Diagnosis not present

## 2022-01-19 NOTE — Progress Notes (Signed)
abstraction

## 2022-01-19 NOTE — Progress Notes (Signed)
Location:  [facility] Room Number: 29A Place of Service:  SNF [phone]) Provider:  Mahlon Gammon, MD   Mahlon Gammon, MD  Patient Care Team: Mahlon Gammon, MD as PCP - General (Internal Medicine)  Extended Emergency Contact Information Primary Emergency Contact: [facility] Phone: [phone] Relation: Sister Secondary Emergency Contact: [facility] [place] Phone: [phone] Mobile Phone: [phone] Relation: Brother  Code Status:  DNR/hospice Goals of care: Advanced Directive information    01/19/2022   11:43 AM  Advanced Directives  Does Patient Have a Medical Advance Directive? Yes  Type of Estate agent of [place];Out of facility DNR (pink MOST or yellow form)  Does patient want to make changes to medical advance directive? No - Patient declined  Copy of Healthcare Power of Attorney in Chart? Yes - validated most recent copy scanned in chart (See row information)     Chief Complaint  Patient presents with   Medical Management of Chronic Issues    Routine Visit   Immunizations    Discussed the need for shingles and Pneumonia vaccine   Quality Metric Gaps    Discussed the need for hep c screening and A1c    HPI:  Pt is a 71 y.o. female seen today for medical management of chronic diseases.   Lives in SNF   Patient was diagnosed with frontotemporal dementia in 2019.  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 She is retired Engineer, civil (consulting) Her sister is the POA    She is stable. No new Nursing issues. 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.Does not talk at all [facility] dependent She is now enrolled in Hospice Has lost 10 lbs  since my last visit  Wt Readings from Last 3 Encounters:  01/19/22 143 lb 14.4 oz (65.3 kg)  01/10/22 150 lb (68 kg)  12/23/21 150 lb (68 kg)    Past Medical History:  Diagnosis Date   Dementia (HCC)    Dementia in other diseases classified elsewhere, unspecified severity, with other behavioral disturbance (HCC)    Diabetes mellitus without complication (HCC)    Essential (primary) hypertension    per matrix   Hx of BKA, left (HCC) 10/24/2020   Hypo-osmolality and hyponatremia    Hypothyroidism, unspecified    Other frontotemporal neurocognitive disorder (CODE) (HCC)    Pure hypercholesterolemia, unspecified    Thyroid disease    Unspecified dementia, unspecified severity, without behavioral disturbance, psychotic disturbance, mood disturbance, and anxiety (HCC)    per matrix   History reviewed. No pertinent surgical history.  No Known Allergies  Outpatient Encounter Medications as of 01/19/2022  Medication Sig   acetaminophen (TYLENOL) 500 MG tablet Take 1,000 mg by mouth 2 (two) times daily as needed for fever (pain).   aspirin 81 MG chewable tablet Chew by mouth daily.   calcium carbonate (OS-CAL) 600 MG TABS tablet Take 600 mg by mouth daily with breakfast.   citalopram (CELEXA) 10 MG tablet Take 10 mg by mouth every morning.   Cranberry 450 MG TABS Take by mouth daily.   divalproex (DEPAKOTE) 250 MG DR tablet Take 250 mg by mouth 3 (three) times daily.   Docusate Sodium 100 MG capsule Take 100 mg by mouth 2 (two)  times daily.   guaiFENesin (ROBITUSSIN) 100 MG/5ML liquid Take 200 mg by mouth every 4 (four) hours as needed for cough or congestion.   hydrocortisone cream 1 % Apply 1 Application topically 2 (two) times daily.   Insulin Pen Needle 30G X 5 MM MISC 1 applicator by Does not apply route daily.   levothyroxine (SYNTHROID) 50 MCG tablet Take 50 mcg by mouth daily before breakfast.   loperamide (IMODIUM A-D) 2 MG tablet Take 2 mg by mouth every 8 (eight) hours as  needed for diarrhea or loose stools.   LORazepam (ATIVAN) 0.5 MG tablet Take 0.5 mg by mouth 4 (four) times daily. 4 times a day PRN (as needed)   LORazepam (ATIVAN) 2 MG tablet Take 2 mg by mouth. Take 1 tablet as directed by [facility] staff   melatonin 3 MG TABS tablet Take 3 mg by mouth at bedtime.   NYAMYC powder Apply 1 application topically 2 (two) times daily as needed (groin rash).   polyethylene glycol (MIRALAX / GLYCOLAX) 17 g packet Take 17 g by mouth as needed for mild constipation.   QUEtiapine (SEROQUEL) 25 MG tablet Take 25 mg by mouth every morning.   QUEtiapine (SEROQUEL) 50 MG tablet Take 50 mg by mouth at bedtime.   simvastatin (ZOCOR) 20 MG tablet Take 20 mg by mouth at bedtime.   sodium chloride 1 g tablet Take 1 g by mouth once.   Sodium Fluoride (PREVIDENT 5000 BOOSTER PLUS) 1.1 % PSTE Place onto teeth daily. Brush on teeth with a toothbrush after PM mouth care. Spit out excess and do not rinse   thiamine 100 MG tablet Take 100 mg by mouth every morning. Vitamin B1   vitamin B-12 (CYANOCOBALAMIN) 500 MCG tablet Take 500 mcg by mouth daily.   zinc oxide 20 % ointment Apply 1 application topically 3 (three) times daily as needed (apply to buttocks for redness).   AMBULATORY NON FORMULARY MEDICATION peg3350-sod sul-NaCl-KCl-asb-C  powder in packet; 100-7.5-2.691 gram; amt: 1 Capful (17 grams); oral  Special Instructions: Mix 1 capful (17 grams) in 4-8 oz of H2O and drink po daily  as needed for constipation Once A Day - PRN   Camphor-Menthol-Methyl Sal (SALONPAS) 3.02-19-08 % PTCH Place 1 patch onto the skin See admin instructions. Apply one patch transdermally to left aka stump daily as needed for pain   Multiple Vitamins-Minerals (HEALTHY EYES/LUTEIN-ZEAXANTHIN) CAPS Take 1 capsule by mouth every morning. DO NOT CRUSH   Facility-Administered Encounter Medications as of 01/19/2022  Medication   ketoconazole (NIZORAL) 2 % shampoo    Review of Systems  Unable to  perform ROS: Dementia    Immunization History  Administered Date(s) Administered   Fluad Quad(high Dose 65+) 12/07/2021   Influenza-Unspecified 11/11/2019, 11/11/2020   Janssen (J&J) SARS-COV-2 Vaccination 01/13/2022   Moderna SARS-COV2 Booster Vaccination 02/02/2021   Moderna Sars-Covid-2 Vaccination 03/06/2019, 04/03/2019, 12/18/2019, 11/11/2020   PPD Test 05/05/2019   Pfizer Covid-19 Vaccine Bivalent Booster 16yrs & up 07/27/2021   Pneumococcal Polysaccharide-23 12/09/2013, 11/25/2016   Tdap 03/12/2012   Zoster, Live 11/26/2012   Zoster, Unspecified 11/26/2012   Pertinent  Health Maintenance Due  Topic Date Due   HEMOGLOBIN A1C  11/29/2021   FOOT EXAM  03/09/2022   OPHTHALMOLOGY EXAM  06/02/2022   INFLUENZA VACCINE  Completed   MAMMOGRAM  Discontinued   DEXA SCAN  Discontinued   COLONOSCOPY (Pts 45-59yrs Insurance coverage will need to be confirmed)  Discontinued      10/25/2020  11:00 AM 10/25/2020    7:10 PM 10/26/2020   12:00 PM 03/04/2021    2:48 PM 01/19/2022   11:42 AM  Fall Risk  Falls in the past year?    1 0  Was there an injury with Fall?    0 0  Fall Risk Category Calculator    1 0  Fall Risk Category    Low Low  Patient Fall Risk Level High fall risk High fall risk High fall risk Moderate fall risk Low fall risk  Patient at Risk for Falls Due to    History of fall(s);Impaired balance/gait;Impaired mobility No Fall Risks  Fall risk Follow up    Falls evaluation completed;Education provided;Falls prevention discussed Falls evaluation completed   Functional Status Survey:    Vitals:   01/19/22 1120  BP: 126/82  Pulse: 77  Resp: 16  Temp: 98.2 F (36.8 C)  TempSrc: Temporal  SpO2: 92%  Weight: 143 lb 14.4 oz (65.3 kg)  Height: 5\' 5"  (1.651 m)   Body mass index is 23.95 kg/m. Physical Exam Vitals reviewed.  Constitutional:      Appearance: Normal appearance.  HENT:     Head: Normocephalic.     Nose: Nose normal.     Mouth/Throat:     Mouth:  Mucous membranes are moist.     Pharynx: Oropharynx is clear.  Eyes:     Pupils: Pupils are equal, round, and reactive to light.  Cardiovascular:     Rate and Rhythm: Normal rate and regular rhythm.     Pulses: Normal pulses.     Heart sounds: Normal heart sounds. No murmur heard. Pulmonary:     Effort: Pulmonary effort is normal.     Breath sounds: Normal breath sounds.  Abdominal:     General: Abdomen is flat. Bowel sounds are normal.     Palpations: Abdomen is soft.  Musculoskeletal:        General: No swelling.     Cervical back: Neck supple.     Comments: S/p left  AKA   Skin:    General: Skin is warm.  Neurological:     General: No focal deficit present.     Mental Status: She is alert.     Comments: Has Expressive aphasia  Psychiatric:        Mood and Affect: Mood normal.        Thought Content: Thought content normal.     Labs reviewed: Recent Labs    06/02/21 0000 08/01/21 0000 09/19/21 0000  NA 140 142 139  K 4.6 4.4 4.2  CL 105 108 106  CO2 28* 25* 23*  BUN 24* 25* 27*  CREATININE 0.6 0.6 0.7  CALCIUM 9.1 9.3 9.2   Recent Labs    04/21/21 0000 06/02/21 0000  AST 14 11*  ALT 11 9  ALKPHOS 49 40  ALBUMIN 4.0 3.8   Recent Labs    04/21/21 0000 06/02/21 0000 08/01/21 0000 09/19/21 0000  WBC 8.1 7.1 7.7 6.6  NEUTROABS 5,605.00  --  4,797.00  --   HGB 13.5 12.6 13.6 13.8  HCT 41 38 41 41  PLT 282 193 214 173   Lab Results  Component Value Date   TSH 2.52 12/07/2020   Lab Results  Component Value Date   HGBA1C 6.0 05/30/2021   Lab Results  Component Value Date   CHOL 120 12/07/2020   HDL 47 12/07/2020   LDLCALC 50 12/07/2020   TRIG 148 12/07/2020  Significant Diagnostic Results in last 30 days:  No results found.  Assessment/Plan 1. Frontotemporal dementia with behavioral disturbance ([place]) Losing weight  Total care Continues to be on Seroquel and Depakote for Screaming Also sometimes need Ativan  2. Acquired  hypothyroidism TSH normal in 11/23  3. Hyperlipidemia, unspecified hyperlipidemia type Discontinue Statin as she is hospice now  4. Depression with anxiety Continue Celexa  5. Type 2 diabetes mellitus with peripheral vascular disease ([place]) Off all meds  6. S/P AKA (above knee amputation), left ([place])   7. 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

## 2022-01-30 ENCOUNTER — Other Ambulatory Visit: Payer: Self-pay | Admitting: Orthopedic Surgery

## 2022-01-30 DIAGNOSIS — F02818 Dementia in other diseases classified elsewhere, unspecified severity, with other behavioral disturbance: Secondary | ICD-10-CM

## 2022-01-30 DIAGNOSIS — F418 Other specified anxiety disorders: Secondary | ICD-10-CM

## 2022-01-30 MED ORDER — LORAZEPAM 0.5 MG PO TABS: 0.5000 mg | ORAL_TABLET | ORAL | 0 refills | Status: DC | PRN

## 2022-02-08 ENCOUNTER — Non-Acute Institutional Stay (SKILLED_NURSING_FACILITY): Payer: Medicare Other | Admitting: Orthopedic Surgery

## 2022-02-08 ENCOUNTER — Encounter: Payer: Self-pay | Admitting: Orthopedic Surgery

## 2022-02-08 DIAGNOSIS — L89312 Pressure ulcer of right buttock, stage 2: Secondary | ICD-10-CM | POA: Diagnosis not present

## 2022-02-08 DIAGNOSIS — F02818 Dementia in other diseases classified elsewhere, unspecified severity, with other behavioral disturbance: Secondary | ICD-10-CM

## 2022-02-08 DIAGNOSIS — G3109 Other frontotemporal dementia: Secondary | ICD-10-CM | POA: Diagnosis not present

## 2022-02-08 DIAGNOSIS — L89322 Pressure ulcer of left buttock, stage 2: Secondary | ICD-10-CM

## 2022-02-08 NOTE — Progress Notes (Signed)
Location:  [facility] Room Number: 29A Place of Service:  SNF [phone]) Provider:  Hazle Nordmann, NP  Mahlon Gammon, MD  Patient Care Team: Mahlon Gammon, MD as PCP - General (Internal Medicine)  Extended Emergency Contact Information Primary Emergency Contact: [facility] Phone: [phone] Relation: Sister Secondary Emergency Contact: [facility] Phone: [phone] Mobile Phone: [phone] Relation: Brother  Code Status:  DNR Hospice Goals of care: Advanced Directive information    02/08/2022    3:28 PM  Advanced Directives  Does Patient Have a Medical Advance Directive? Yes  Type of Estate agent of [place];Out of facility DNR (pink MOST or yellow form)  Does patient want to make changes to medical advance directive? No - Patient declined  Copy of Healthcare Power of Attorney in Chart? Yes - validated most recent copy scanned in chart (See row information)     Chief Complaint  Patient presents with   Acute Visit    Patient is being seen for pressure sores     HPI:  Pt is a 71 y.o. female seen today for an acute visit for pressure sores.   She currently resides on the skilled nursing unit at [facility] due to frontotemporal dementia. PMH: HTN, hypothyroidism, T2DM, alcohol abuse, psychosis, wernicke-korsakoff syndrome, thrombocytopenia.     Now followed by hospice due to advanced dementia.   12/19 nursing noted skin breakdown to left buttocks. Nursing has been applying zinc oxide barrier cream. Prostat also ordered. Today, 2 small skin tears to left and right buttocks. She is incontinent of bowel and bladder. She is more sedentary than a few months ago. Does not it in recliner as often. Sleeping more during the day.   Frontotemporal dementia- MRI 2019 remote infarcts right anterior temporal lobe and right periventricular basal ganglia, marked severe cerebral atrophy temporal parietal lobes, dependent  with all ADLs including feeding, tics of fake sneezing has reduced, remains on Seroquel and Depakote    Past Medical History:  Diagnosis Date   Dementia (HCC)    Dementia in other diseases classified elsewhere, unspecified severity, with other behavioral disturbance (HCC)    Diabetes mellitus without complication (HCC)    Essential (primary) hypertension    per matrix   Hx of BKA, left (HCC) 10/24/2020   Hypo-osmolality and hyponatremia    Hypothyroidism, unspecified    Other frontotemporal neurocognitive disorder (CODE) (HCC)    Pure hypercholesterolemia, unspecified    Thyroid disease    Unspecified dementia, unspecified severity, without behavioral disturbance, psychotic disturbance, mood disturbance, and anxiety (HCC)    per matrix   History reviewed. No pertinent surgical history.  No Known Allergies  Outpatient Encounter Medications as of 02/08/2022  Medication Sig   acetaminophen (TYLENOL) 500 MG tablet Take 1,000 mg by mouth 2 (two) times daily as needed for fever (pain).   AMBULATORY NON FORMULARY MEDICATION peg3350-sod sul-NaCl-KCl-asb-C  powder in packet; 100-7.5-2.691 gram; amt: 1 Capful (17 grams); oral  Special Instructions: Mix 1 capful (17 grams) in 4-8 oz of H2O and drink po daily  as needed for constipation Once A Day - PRN   aspirin 81 MG chewable tablet Chew by mouth daily.   calcium carbonate (OS-CAL) 600 MG TABS tablet Take 600 mg by mouth daily with breakfast.   Camphor-Menthol-Methyl Sal (SALONPAS) 3.02-19-08 % PTCH Place 1 patch onto the skin See admin instructions. Apply one patch transdermally to left aka stump daily as needed for pain   citalopram (CELEXA) 10 MG  tablet Take 10 mg by mouth every morning.   Cranberry 450 MG TABS Take by mouth daily.   divalproex (DEPAKOTE) 250 MG DR tablet Take 250 mg by mouth 3 (three) times daily.   Docusate Sodium 100 MG capsule Take 100 mg by mouth 2 (two) times daily.   guaiFENesin (ROBITUSSIN) 100 MG/5ML liquid Take  200 mg by mouth every 4 (four) hours as needed for cough or congestion.   hydrocortisone cream 1 % Apply 1 Application topically 2 (two) times daily.   Insulin Pen Needle 30G X 5 MM MISC 1 applicator by Does not apply route daily.   levothyroxine (SYNTHROID) 50 MCG tablet Take 50 mcg by mouth daily before breakfast.   loperamide (IMODIUM A-D) 2 MG tablet Take 2 mg by mouth every 8 (eight) hours as needed for diarrhea or loose stools.   LORazepam (ATIVAN) 0.5 MG tablet Take 1 tablet (0.5 mg total) by mouth every 4 (four) hours as needed for anxiety or sedation. 4 times a day PRN (as needed)   LORazepam (ATIVAN) 2 MG tablet Take 2 mg by mouth. Take 1 tablet as directed by [facility] staff   melatonin 3 MG TABS tablet Take 3 mg by mouth at bedtime.   Multiple Vitamins-Minerals (HEALTHY EYES/LUTEIN-ZEAXANTHIN) CAPS Take 1 capsule by mouth every morning. DO NOT CRUSH   NYAMYC powder Apply 1 application topically 2 (two) times daily as needed (groin rash).   polyethylene glycol (MIRALAX / GLYCOLAX) 17 g packet Take 17 g by mouth as needed for mild constipation.   QUEtiapine (SEROQUEL) 25 MG tablet Take 25 mg by mouth every morning.   QUEtiapine (SEROQUEL) 50 MG tablet Take 50 mg by mouth at bedtime.   simvastatin (ZOCOR) 20 MG tablet Take 20 mg by mouth at bedtime.   sodium chloride 1 g tablet Take 1 g by mouth once.   Sodium Fluoride (PREVIDENT 5000 BOOSTER PLUS) 1.1 % PSTE Place onto teeth daily. Brush on teeth with a toothbrush after PM mouth care. Spit out excess and do not rinse   thiamine 100 MG tablet Take 100 mg by mouth every morning. Vitamin B1   vitamin B-12 (CYANOCOBALAMIN) 500 MCG tablet Take 500 mcg by mouth daily.   zinc oxide 20 % ointment Apply 1 application topically 3 (three) times daily as needed (apply to buttocks for redness).   Facility-Administered Encounter Medications as of 02/08/2022  Medication   ketoconazole (NIZORAL) 2 % shampoo    Review of Systems  Unable  to perform ROS: Dementia    Immunization History  Administered Date(s) Administered   Fluad Quad(high Dose 65+) 12/07/2021   Influenza-Unspecified 11/11/2019, 11/11/2020   Janssen (J&J) SARS-COV-2 Vaccination 01/13/2022   Moderna SARS-COV2 Booster Vaccination 02/02/2021   Moderna Sars-Covid-2 Vaccination 03/06/2019, 04/03/2019, 12/18/2019, 11/11/2020   PPD Test 05/05/2019   Pfizer Covid-19 Vaccine Bivalent Booster 6239yrs & up 07/27/2021   Pneumococcal Polysaccharide-23 12/09/2013, 11/25/2016   Tdap 03/12/2012   Zoster, Live 11/26/2012   Zoster, Unspecified 11/26/2012   Pertinent  Health Maintenance Due  Topic Date Due   FOOT EXAM  03/09/2022   OPHTHALMOLOGY EXAM  06/02/2022   HEMOGLOBIN A1C  06/26/2022   INFLUENZA VACCINE  Completed   MAMMOGRAM  Discontinued   DEXA SCAN  Discontinued   COLONOSCOPY (Pts 45-8810yrs Insurance coverage will need to be confirmed)  Discontinued      10/25/2020   11:00 AM 10/25/2020    7:10 PM 10/26/2020   12:00 PM 03/04/2021    2:48  PM 01/19/2022   11:42 AM  Fall Risk  Falls in the past year?    1 0  Was there an injury with Fall?    0 0  Fall Risk Category Calculator    1 0  Fall Risk Category    Low Low  Patient Fall Risk Level High fall risk High fall risk High fall risk Moderate fall risk Low fall risk  Patient at Risk for Falls Due to    History of fall(s);Impaired balance/gait;Impaired mobility No Fall Risks  Fall risk Follow up    Falls evaluation completed;Education provided;Falls prevention discussed Falls evaluation completed   Functional Status Survey:    Vitals:   02/08/22 1504  BP: 104/73  Pulse: 89  Resp: 18  Temp: 99.8 F (37.7 C)  TempSrc: Temporal  SpO2: 95%  Weight: 143 lb 14.4 oz (65.3 kg)  Height: 5\' 5"  (1.651 m)   Body mass index is 23.95 kg/m. Physical Exam Vitals reviewed.  Constitutional:      General: She is not in acute distress. HENT:     Head: Normocephalic.  Eyes:     General:        Right eye: No  discharge.        Left eye: No discharge.  Cardiovascular:     Rate and Rhythm: Normal rate and regular rhythm.     Pulses: Normal pulses.     Heart sounds: Normal heart sounds.  Pulmonary:     Effort: Pulmonary effort is normal. No respiratory distress.     Breath sounds: Normal breath sounds. No wheezing.  Abdominal:     General: Bowel sounds are normal. There is no distension.     Palpations: Abdomen is soft.     Tenderness: There is no abdominal tenderness.  Musculoskeletal:     Cervical back: Neck supple.     Right lower leg: No edema.     Comments: Left BKA  Skin:    General: Skin is warm and dry.     Capillary Refill: Capillary refill takes less than 2 seconds.     Comments: Approx < 0.5 cm skin tear x 2 to left and right buttocks, granulation tissue to wound bed, no sign of infection, surrounding skin blanchable  Neurological:     General: No focal deficit present.     Mental Status: She is alert. Mental status is at baseline.     Motor: Weakness present.     Gait: Gait abnormal.     Comments: hoyer  Psychiatric:        Mood and Affect: Mood normal.        Behavior: Behavior normal.     Comments: Nonverbal, does not follow commands     Labs reviewed: Recent Labs    06/02/21 0000 08/01/21 0000 09/19/21 0000  NA 140 142 139  K 4.6 4.4 4.2  CL 105 108 106  CO2 28* 25* 23*  BUN 24* 25* 27*  CREATININE 0.6 0.6 0.7  CALCIUM 9.1 9.3 9.2   Recent Labs    04/21/21 0000 06/02/21 0000  AST 14 11*  ALT 11 9  ALKPHOS 49 40  ALBUMIN 4.0 3.8   Recent Labs    04/21/21 0000 06/02/21 0000 08/01/21 0000 09/19/21 0000  WBC 8.1 7.1 7.7 6.6  NEUTROABS 5,605.00  --  4,797.00  --   HGB 13.5 12.6 13.6 13.8  HCT 41 38 41 41  PLT 282 193 214 173   Lab Results  Component Value  Date   TSH 3.19 12/26/2021   Lab Results  Component Value Date   HGBA1C 6.5 12/26/2021   Lab Results  Component Value Date   CHOL 188 12/26/2021   HDL 60 12/26/2021   LDLCALC 106  12/26/2021   TRIG 119 12/26/2021    Significant Diagnostic Results in last 30 days:  No results found.  Assessment/Plan 1. Pressure injury of left buttock, stage 2 (HCC) - bed bound, incontinent of bowel and bladder - 2 small areas of breakdown- first noted 12/19 - start foam dressing to left and right buttocks  - cont frequent position changing - cont Prostat - may consider air mattress  2. Pressure injury of right buttock, stage 2 (HCC) - see above  3. Frontotemporal dementia with behavioral disturbance (HCC) - now followed by hospice - no behaviors - dependent with all ADLs - starting to eat less with some weight loss - cont skilled nursing  - cont Seroquel and Depakote    Family/ staff Communication: plan discussed with patient and nurse  Labs/tests ordered:  none

## 2022-02-17 ENCOUNTER — Encounter: Payer: Self-pay | Admitting: Orthopedic Surgery

## 2022-02-17 ENCOUNTER — Non-Acute Institutional Stay (SKILLED_NURSING_FACILITY): Payer: Medicare Other | Admitting: Orthopedic Surgery

## 2022-02-17 DIAGNOSIS — I10 Essential (primary) hypertension: Secondary | ICD-10-CM

## 2022-02-17 DIAGNOSIS — E039 Hypothyroidism, unspecified: Secondary | ICD-10-CM

## 2022-02-17 DIAGNOSIS — Z89612 Acquired absence of left leg above knee: Secondary | ICD-10-CM

## 2022-02-17 DIAGNOSIS — L89322 Pressure ulcer of left buttock, stage 2: Secondary | ICD-10-CM

## 2022-02-17 DIAGNOSIS — L89312 Pressure ulcer of right buttock, stage 2: Secondary | ICD-10-CM | POA: Diagnosis not present

## 2022-02-17 DIAGNOSIS — E1151 Type 2 diabetes mellitus with diabetic peripheral angiopathy without gangrene: Secondary | ICD-10-CM

## 2022-02-17 DIAGNOSIS — N390 Urinary tract infection, site not specified: Secondary | ICD-10-CM

## 2022-02-17 DIAGNOSIS — R634 Abnormal weight loss: Secondary | ICD-10-CM

## 2022-02-17 DIAGNOSIS — F418 Other specified anxiety disorders: Secondary | ICD-10-CM

## 2022-02-17 DIAGNOSIS — G3109 Other frontotemporal dementia: Secondary | ICD-10-CM

## 2022-02-17 DIAGNOSIS — F02818 Dementia in other diseases classified elsewhere, unspecified severity, with other behavioral disturbance: Secondary | ICD-10-CM

## 2022-02-17 DIAGNOSIS — E871 Hypo-osmolality and hyponatremia: Secondary | ICD-10-CM | POA: Diagnosis not present

## 2022-02-17 DIAGNOSIS — E785 Hyperlipidemia, unspecified: Secondary | ICD-10-CM | POA: Diagnosis not present

## 2022-02-17 NOTE — Progress Notes (Signed)
Location:  [place] Room Number: [place] of Service:  SNF [phone]) Provider:  Windell Moulding, NP   Virgie Dad, MD  Patient Care Team: Virgie Dad, MD as PCP - General (Internal Medicine)  Extended Emergency Contact Information Primary Emergency Contact: [facility] Phone: [phone] Relation: Sister Secondary Emergency Contact: [facility] Phone: [phone] Mobile Phone: [phone] Relation: Brother  Code Status:  DNR Goals of care: Advanced Directive information    02/17/2022    1:23 PM  Advanced Directives  Does Patient Have a Medical Advance Directive? Yes  Type of Paramedic of [place];Out of facility DNR (pink MOST or yellow form)  Does patient want to make changes to medical advance directive? No - Patient declined  Copy of [place] in Chart? Yes - validated most recent copy scanned in chart (See row information)     Chief Complaint  Patient presents with   Medical Management of Chronic Issues    Routine Visit    HPI:  Pt is a 72 y.o. female seen today for medical management of chronic diseases.    She currently resides on the skilled nursing unit at [facility] due to frontotemporal dementia. PMH: HTN, hypothyroidism, T2DM, alcohol abuse, psychosis, wernicke-korsakoff syndrome, thrombocytopenia.    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, marked severe cerebral atrophy temporal parietal lobes, dependent with all ADLs including feeding, tics of fake sneezing has reduced- now nonverbal most of time, remains on Seroquel and Depakote Weight loss- see below, assisted feedings with CNA, eating about 50% of meals T2DM-  A1c 6.5 12/26/2021, sugars 120-160's, no recent hypoglycemia, Metformin recently discontinued 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   Recent blood pressures:  01/02- 135/86  12/26- 104/73  12/19- 142/70  Recent weights:  01/05- 143.9 lbs  12/02- 143.9 lbs  11/01- 150 lbs  09/01- 158.5 lbs  History reviewed. No pertinent surgical history.  No Known Allergies  Outpatient Encounter Medications as of 02/17/2022  Medication Sig   acetaminophen (TYLENOL) 500 MG tablet Take 1,000 mg by mouth 2 (two) times daily as needed for fever (pain).   AMBULATORY NON FORMULARY MEDICATION peg3350-sod sul-NaCl-KCl-[id number]-C  powder in packet; 100-7.5-2.691 gram; amt: 1 Capful (17 grams); oral  Special Instructions: Mix 1 capful (17 grams) in 4-8 oz of H2O and drink po daily  as needed for constipation Once A Day - PRN   aspirin 81 MG chewable tablet Chew by mouth daily.   calcium carbonate (OS-CAL) 600 MG TABS tablet Take 600 mg by mouth daily with breakfast.   Camphor-Menthol-Methyl Sal (SALONPAS) 3.02-19-08 % PTCH Place 1 patch onto the skin See admin instructions. Apply one patch transdermally to left aka stump daily as needed for pain   citalopram (CELEXA) 10 MG tablet Take 10 mg by mouth every morning.   Cranberry 450 MG TABS Take by mouth  daily.   divalproex (DEPAKOTE) 250 MG DR tablet Take 250 mg by mouth 3 (three) times daily.   Docusate Sodium 100 MG capsule Take 100 mg by mouth 2 (two)  times daily.   guaiFENesin (ROBITUSSIN) 100 MG/5ML liquid Take 200 mg by mouth every 4 (four) hours as needed for cough or congestion.   hydrocortisone cream 1 % Apply 1 Application topically 2 (two) times daily.   Insulin Pen Needle 30G X 5 MM MISC 1 applicator by Does not apply route daily.   levothyroxine (SYNTHROID) 50 MCG tablet Take 50 mcg by mouth daily before breakfast.   loperamide (IMODIUM A-D) 2 MG tablet Take 2 mg by mouth every 8 (eight) hours as needed for diarrhea or loose stools.   LORazepam (ATIVAN) 0.5 MG tablet Take 1 tablet (0.5 mg total) by mouth every 4 (four) hours as needed for anxiety or sedation. 4 times a day PRN (as needed)   LORazepam (ATIVAN) 2 MG tablet Take 2 mg by mouth. Take 1 tablet as directed by [facility] staff   melatonin 3 MG TABS tablet Take 3 mg by mouth at bedtime.   Multiple Vitamins-Minerals (HEALTHY EYES/LUTEIN-ZEAXANTHIN) CAPS Take 1 capsule by mouth every morning. DO NOT CRUSH   NYAMYC powder Apply 1 application topically 2 (two) times daily as needed (groin rash).   polyethylene glycol (MIRALAX / GLYCOLAX) 17 g packet Take 17 g by mouth as needed for mild constipation.   QUEtiapine (SEROQUEL) 25 MG tablet Take 25 mg by mouth every morning.   QUEtiapine (SEROQUEL) 50 MG tablet Take 50 mg by mouth at bedtime.   simvastatin (ZOCOR) 20 MG tablet Take 20 mg by mouth at bedtime.   sodium chloride 1 g tablet Take 1 g by mouth once.   Sodium Fluoride (PREVIDENT 5000 BOOSTER PLUS) 1.1 % PSTE Place onto teeth daily. Brush on teeth with a toothbrush after PM mouth care. Spit out excess and do not rinse   thiamine 100 MG tablet Take 100 mg by mouth every morning. Vitamin B1   vitamin B-12 (CYANOCOBALAMIN) 500 MCG tablet Take 500 mcg by mouth daily.   zinc oxide 20 % ointment Apply 1 application topically 3 (three) times daily as needed (apply to buttocks for redness).   Facility-Administered Encounter Medications as of 02/17/2022  Medication    ketoconazole (NIZORAL) 2 % shampoo    Review of Systems  Unable to perform ROS: Dementia    Immunization History  Administered Date(s) Administered   Fluad Quad(high Dose 65+) 12/07/2021   Influenza-Unspecified 11/11/2019, 11/11/2020   Janssen (J&J) SARS-COV-2 Vaccination 01/13/2022   Moderna SARS-COV2 Booster Vaccination 02/02/2021   Moderna Sars-Covid-2 Vaccination 03/06/2019, 04/03/2019, 12/18/2019, 11/11/2020   PPD Test 05/05/2019   Pfizer Covid-19 Vaccine Bivalent Booster 40yrs & up 07/27/2021   Pneumococcal Polysaccharide-23 12/09/2013, 11/25/2016   Tdap 03/12/2012   Zoster, Live 11/26/2012   Zoster, Unspecified 11/26/2012   Pertinent  Health Maintenance Due  Topic Date Due   FOOT EXAM  03/09/2022   OPHTHALMOLOGY EXAM  06/02/2022   HEMOGLOBIN A1C  06/26/2022   INFLUENZA VACCINE  Completed   MAMMOGRAM  Discontinued   DEXA SCAN  Discontinued   COLONOSCOPY (Pts 45-13yrs Insurance coverage will need to be confirmed)  Discontinued      10/25/2020    7:10 PM 10/26/2020   12:00 PM 03/04/2021    2:48 PM 01/19/2022   11:42 AM 02/17/2022    1:22 PM  Fall Risk  Falls in the past year?  1 0 0  Was there an injury with Fall?   0 0 0  Fall Risk Category Calculator   1 0 0  Fall Risk Category   Low Low Low  Patient Fall Risk Level High fall risk High fall risk Moderate fall risk Low fall risk Low fall risk  Patient at Risk for Falls Due to   History of fall(s);Impaired balance/gait;Impaired mobility No Fall Risks No Fall Risks  Fall risk Follow up   Falls evaluation completed;Education provided;Falls prevention discussed Falls evaluation completed Falls evaluation completed   Functional Status Survey:    Vitals:   02/17/22 1316  BP: 135/86  Pulse: 87  Resp: 16  Temp: (!) 97.1 F (36.2 C)  SpO2: 99%  Weight: 143 lb 9 oz (65.1 kg)  Height: 5\' 5"  (1.651 m)   Body mass index is 23.89 kg/m. Physical Exam Vitals reviewed.  Constitutional:      General: She is not in  acute distress. HENT:     Head: Normocephalic.     Right Ear: There is no impacted cerumen.     Left Ear: There is no impacted cerumen.     Nose: Nose normal.     Mouth/Throat:     Mouth: Mucous membranes are moist.  Eyes:     General:        Right eye: No discharge.        Left eye: No discharge.  Cardiovascular:     Rate and Rhythm: Normal rate and regular rhythm.     Pulses: Normal pulses.     Heart sounds: Normal heart sounds.  Pulmonary:     Effort: Pulmonary effort is normal. No respiratory distress.     Breath sounds: Normal breath sounds. No wheezing.  Abdominal:     General: Bowel sounds are normal. There is no distension.     Palpations: Abdomen is soft.     Tenderness: There is no abdominal tenderness.  Musculoskeletal:     Cervical back: Neck supple.     Right lower leg: No edema.     Comments: Left BKA  Skin:    General: Skin is warm and dry.     Capillary Refill: Capillary refill takes less than 2 seconds.     Comments: Left/right pressure sores improved, now closed, surrounding skin blanchable  Neurological:     General: No focal deficit present.     Mental Status: She is alert. Mental status is at baseline.     Motor: Weakness present.     Gait: Gait abnormal.     Comments: Hoyer,bedbound  Psychiatric:        Mood and Affect: Mood normal.        Behavior: Behavior normal.     Comments: Non verbal, does not follow commands     Labs reviewed: Recent Labs    06/02/21 0000 08/01/21 0000 09/19/21 0000  NA 140 142 139  K 4.6 4.4 4.2  CL 105 108 106  CO2 28* 25* 23*  BUN 24* 25* 27*  CREATININE 0.6 0.6 0.7  CALCIUM 9.1 9.3 9.2   Recent Labs    04/21/21 0000 06/02/21 0000  AST 14 11*  ALT 11 9  ALKPHOS 49 40  ALBUMIN 4.0 3.8   Recent Labs    04/21/21 0000 06/02/21 0000 08/01/21 0000 09/19/21 0000  WBC 8.1 7.1 7.7 6.6  NEUTROABS 5,605.00  --  4,797.00  --   HGB 13.5 12.6 13.6 13.8  HCT 41 38 41 41  PLT 282 193 214 173   Lab Results   Component Value Date   TSH 3.19 12/26/2021   Lab Results  Component Value Date   HGBA1C 6.5 12/26/2021   Lab Results  Component Value Date   CHOL 188 12/26/2021   HDL 60 12/26/2021   LDLCALC 106 12/26/2021   TRIG 119 12/26/2021    Significant Diagnostic Results in last 30 days:  No results found.  Assessment/Plan: 1. Pressure injury of left buttock, stage 2 (HCC) - improved - cont foam dressing and frequent turning - cont Prostat  2. Pressure injury of right buttock, stage 2 (HCC) - see above  3. Frontotemporal dementia with behavioral disturbance (HCC) - followed by hospice - dependent with all ADLs including feedings - cont Depakote and Seroquel - cont skilled buring  4. Weight loss - stable - cont monthly weight  5. Type 2 diabetes mellitus with peripheral vascular disease (HCC) - A1c 6.5, sugars 120-160's - diet controlled - off metformin  6. S/P AKA (above knee amputation), left (HCC) - cont skilled nursing care  7. Recurrent UTI - h/o ESBL 05/2021 - cont cranberry supplement  8. Essential hypertension - controlled without medication  9. Hyperlipidemia, unspecified hyperlipidemia type - LDL stable - cont simvastatin  10. Acquired hypothyroidism - TSH normal - cont levothyroxine  11. Hyponatremia - Na+ stable - cont sodium tablets  12. Depression with anxiety - no mood changes - cont Celexa and ativan    Family/ staff Communication: plan discussed with patient and nurse  Labs/tests ordered:  none

## 2022-03-10 ENCOUNTER — Other Ambulatory Visit: Payer: Self-pay | Admitting: Orthopedic Surgery

## 2022-03-10 DIAGNOSIS — F418 Other specified anxiety disorders: Secondary | ICD-10-CM

## 2022-03-10 DIAGNOSIS — F02818 Dementia in other diseases classified elsewhere, unspecified severity, with other behavioral disturbance: Secondary | ICD-10-CM

## 2022-03-10 MED ORDER — LORAZEPAM 0.5 MG PO TABS: 0.5000 mg | ORAL_TABLET | ORAL | 0 refills | Status: DC | PRN

## 2022-03-17 ENCOUNTER — Encounter: Payer: Self-pay | Admitting: Orthopedic Surgery

## 2022-03-17 ENCOUNTER — Non-Acute Institutional Stay (SKILLED_NURSING_FACILITY): Payer: Medicare Other | Admitting: Orthopedic Surgery

## 2022-03-17 DIAGNOSIS — N390 Urinary tract infection, site not specified: Secondary | ICD-10-CM

## 2022-03-17 DIAGNOSIS — G3109 Other frontotemporal dementia: Secondary | ICD-10-CM | POA: Diagnosis not present

## 2022-03-17 DIAGNOSIS — R634 Abnormal weight loss: Secondary | ICD-10-CM

## 2022-03-17 DIAGNOSIS — E871 Hypo-osmolality and hyponatremia: Secondary | ICD-10-CM

## 2022-03-17 DIAGNOSIS — Z89612 Acquired absence of left leg above knee: Secondary | ICD-10-CM

## 2022-03-17 DIAGNOSIS — F02818 Dementia in other diseases classified elsewhere, unspecified severity, with other behavioral disturbance: Secondary | ICD-10-CM | POA: Diagnosis not present

## 2022-03-17 DIAGNOSIS — E785 Hyperlipidemia, unspecified: Secondary | ICD-10-CM | POA: Diagnosis not present

## 2022-03-17 DIAGNOSIS — E039 Hypothyroidism, unspecified: Secondary | ICD-10-CM

## 2022-03-17 DIAGNOSIS — L89322 Pressure ulcer of left buttock, stage 2: Secondary | ICD-10-CM

## 2022-03-17 DIAGNOSIS — E1151 Type 2 diabetes mellitus with diabetic peripheral angiopathy without gangrene: Secondary | ICD-10-CM | POA: Diagnosis not present

## 2022-03-17 DIAGNOSIS — I10 Essential (primary) hypertension: Secondary | ICD-10-CM | POA: Diagnosis not present

## 2022-03-17 DIAGNOSIS — F418 Other specified anxiety disorders: Secondary | ICD-10-CM

## 2022-03-17 NOTE — Progress Notes (Signed)
Location:  [place] Room Number: 29/A Place of Service:  SNF [phone]) Provider:  Yvonna Alanis, NP   Virgie Dad, MD  Patient Care Team: Virgie Dad, MD as PCP - General (Internal Medicine)  Extended Emergency Contact Information Primary Emergency Contact: [facility] Phone: [phone] Relation: Sister Secondary Emergency Contact: [facility] Phone: [phone] Mobile Phone: [phone] Relation: Brother  Code Status:  DNR Goals of care: Advanced Directive information    02/17/2022    1:23 PM  Advanced Directives  Does Patient Have a Medical Advance Directive? Yes  Type of Paramedic of [place];Out of facility DNR (pink MOST or yellow form)  Does patient want to make changes to medical advance directive? No - Patient declined  Copy of [place] in Chart? Yes - validated most recent copy scanned in chart (See row information)     Chief Complaint  Patient presents with   Medical Management of Chronic Issues    HPI:  Pt is a 72 y.o. female seen today for medical management of chronic diseases.    She currently resides on the skilled nursing unit at [facility] due to frontotemporal dementia. PMH: HTN, hypothyroidism, T2DM, alcohol abuse, psychosis, wernicke-korsakoff syndrome, thrombocytopenia.     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, marked severe cerebral atrophy temporal parietal lobes, dependent with all ADLs including feeding, nonverbal most of time, tics of fake sneezing less often, remains on Seroquel and Depakote Weight loss- see below, assisted feedings with CNA T2DM- A1c 6.5 12/26/2021, sugars 130-200's, no recent hypoglycemia, Metformin recently discontinued, foot exam done today, no future eye exams due to advanced  dementia 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   Recent blood pressures:  01/30- 140/86  01/23- 122/72  01/16- 123/82  Recent weights:  02/02- 144.9 lbs  01/02- 143.9 lbs  12/02- 143.9 lbs  05/16/2021- 160.5 lbs   Past Medical History:  Diagnosis Date   Dementia ([place])    Dementia in other diseases classified elsewhere, unspecified severity, with other behavioral disturbance ([place])    Diabetes mellitus without complication ([place])    Essential (primary) hypertension    per matrix   Hx of BKA, left ([place]) 10/24/2020   Hypo-osmolality and hyponatremia    Hypothyroidism, unspecified    Other frontotemporal neurocognitive disorder (CODE) ([place])    Pure hypercholesterolemia, unspecified    Thyroid disease    Unspecified dementia, unspecified severity, without behavioral disturbance, psychotic disturbance, mood disturbance, and anxiety ([place])    per matrix   No past surgical history on file.  No Known Allergies  Outpatient Encounter Medications as of 03/17/2022  Medication Sig   acetaminophen (TYLENOL) 500 MG tablet Take 1,000 mg by mouth 2 (two) times daily as needed for fever (pain).   AMBULATORY NON FORMULARY MEDICATION peg3350-sod sul-NaCl-KCl-asb-C  powder in packet; 100-7.5-2.691 gram; amt: 1 Capful (17 grams); oral  Special Instructions: Mix 1 capful (17 grams) in 4-8 oz of H2O and drink  po daily  as needed for constipation Once A Day - PRN   aspirin 81 MG chewable tablet Chew by mouth  daily.   calcium carbonate (OS-CAL) 600 MG TABS tablet Take 600 mg by mouth daily with breakfast.   Camphor-Menthol-Methyl Sal (SALONPAS) 3.02-19-08 % PTCH Place 1 patch onto the skin See admin instructions. Apply one patch transdermally to left aka stump daily as needed for pain   citalopram (CELEXA) 10 MG tablet Take 10 mg by mouth every morning.   Cranberry 450 MG TABS Take by mouth daily.   divalproex (DEPAKOTE) 250 MG DR tablet Take 250 mg by mouth 3 (three) times daily.   Docusate Sodium 100 MG capsule Take 100 mg by mouth 2 (two) times daily.   guaiFENesin (ROBITUSSIN) 100 MG/5ML liquid Take 200 mg by mouth every 4 (four) hours as needed for cough or congestion.   hydrocortisone cream 1 % Apply 1 Application topically 2 (two) times daily.   Insulin Pen Needle 30G X 5 MM MISC 1 applicator by Does not apply route daily.   levothyroxine (SYNTHROID) 50 MCG tablet Take 50 mcg by mouth daily before breakfast.   loperamide (IMODIUM A-D) 2 MG tablet Take 2 mg by mouth every 8 (eight) hours as needed for diarrhea or loose stools.   LORazepam (ATIVAN) 0.5 MG tablet Take 1 tablet (0.5 mg total) by mouth every 4 (four) hours as needed for anxiety or sedation. 4 times a day PRN (as needed)   LORazepam (ATIVAN) 2 MG tablet Take 2 mg by mouth. Take 1 tablet as directed by [facility] staff   melatonin 3 MG TABS tablet Take 3 mg by mouth at bedtime.   Multiple Vitamins-Minerals (HEALTHY EYES/LUTEIN-ZEAXANTHIN) CAPS Take 1 capsule by mouth every morning. DO NOT CRUSH   NYAMYC powder Apply 1 application topically 2 (two) times daily as needed (groin rash).   polyethylene glycol (MIRALAX / GLYCOLAX) 17 g packet Take 17 g by mouth as needed for mild constipation.   QUEtiapine (SEROQUEL) 25 MG tablet Take 25 mg by mouth every morning.   QUEtiapine (SEROQUEL) 50 MG tablet Take 50 mg by mouth at bedtime.   simvastatin (ZOCOR) 20 MG tablet Take 20 mg by mouth at bedtime.   sodium chloride 1 g tablet Take 1  g by mouth once.   Sodium Fluoride (PREVIDENT 5000 BOOSTER PLUS) 1.1 % PSTE Place onto teeth daily. Brush on teeth with a toothbrush after PM mouth care. Spit out excess and do not rinse   thiamine 100 MG tablet Take 100 mg by mouth every morning. Vitamin B1   vitamin B-12 (CYANOCOBALAMIN) 500 MCG tablet Take 500 mcg by mouth daily.   zinc oxide 20 % ointment Apply 1 application topically 3 (three) times daily as needed (apply to buttocks for redness).   Facility-Administered Encounter Medications as of 03/17/2022  Medication   ketoconazole (NIZORAL) 2 % shampoo    Review of Systems  Unable to perform ROS: Dementia    Immunization History  Administered Date(s) Administered   Fluad Quad(high Dose 65+) 12/07/2021   Influenza-Unspecified 11/11/2019, 11/11/2020   Janssen (J&J) SARS-COV-2 Vaccination 01/13/2022   Moderna SARS-COV2 Booster Vaccination 02/02/2021   Moderna Sars-Covid-2 Vaccination 03/06/2019, 04/03/2019, 12/18/2019, 11/11/2020   PPD Test 05/05/2019   Pfizer Covid-19 Vaccine Bivalent Booster 28yrs & up 07/27/2021   Pneumococcal Conjugate-13 12/09/2016   Pneumococcal Polysaccharide-23 12/09/2013   Tdap 03/12/2012   Zoster, Live 11/26/2012   Zoster, Unspecified 11/26/2012   Pertinent  Health Maintenance  Due  Topic Date Due   FOOT EXAM  03/09/2022   OPHTHALMOLOGY EXAM  06/02/2022   HEMOGLOBIN A1C  06/26/2022   INFLUENZA VACCINE  Completed   MAMMOGRAM  Discontinued   DEXA SCAN  Discontinued   COLONOSCOPY (Pts 45-26yrs Insurance coverage will need to be confirmed)  Discontinued      10/25/2020    7:10 PM 10/26/2020   12:00 PM 03/04/2021    2:48 PM 01/19/2022   11:42 AM 02/17/2022    1:22 PM  Fall Risk  Falls in the past year?   1 0 0  Was there an injury with Fall?   0 0 0  Fall Risk Category Calculator   1 0 0  Fall Risk Category (Retired)   Low Low Low  (RETIRED) Patient Fall Risk Level High fall risk High fall risk Moderate fall risk Low fall risk Low fall risk   Patient at Risk for Falls Due to   History of fall(s);Impaired balance/gait;Impaired mobility No Fall Risks No Fall Risks  Fall risk Follow up   Falls evaluation completed;Education provided;Falls prevention discussed Falls evaluation completed Falls evaluation completed   Functional Status Survey:    Vitals:   03/17/22 1322  BP: (!) 140/86  Pulse: 93  Resp: 16  Temp: (!) 96 F (35.6 C)  SpO2: 98%  Weight: 144 lb 14.4 oz (65.7 kg)  Height: 5\' 5"  (1.651 m)   Body mass index is 24.11 kg/m. Physical Exam Vitals reviewed.  Constitutional:      General: She is not in acute distress. HENT:     Head: Normocephalic.     Right Ear: There is no impacted cerumen.     Left Ear: There is no impacted cerumen.     Nose: Nose normal.     Mouth/Throat:     Mouth: Mucous membranes are moist.  Eyes:     General:        Right eye: No discharge.        Left eye: No discharge.  Cardiovascular:     Rate and Rhythm: Normal rate and regular rhythm.     Pulses:          Dorsalis pedis pulses are 1+ on the right side.       Posterior tibial pulses are 1+ on the right side.     Heart sounds: Normal heart sounds.  Pulmonary:     Effort: Pulmonary effort is normal. No respiratory distress.     Breath sounds: Normal breath sounds. No wheezing.  Abdominal:     General: Bowel sounds are normal. There is no distension.     Palpations: Abdomen is soft.     Tenderness: There is no abdominal tenderness.  Musculoskeletal:     Cervical back: Neck supple.     Right lower leg: No edema.     Right foot: Normal range of motion.     Comments: Left BKA     Left Lower Extremity: Left leg is amputated below knee.  Feet:     Right foot:     Skin integrity: Skin integrity normal.     Toenail Condition: Right toenails are normal.     Comments: UTA monofilament due to dementia/nonverbal Lymphadenopathy:     Cervical: No cervical adenopathy.  Skin:    General: Skin is dry.     Capillary Refill: Capillary  refill takes less than 2 seconds.     Comments: No skin breakdown over pressure points  Neurological:  General: No focal deficit present.     Mental Status: She is alert. Mental status is at baseline.     Motor: Weakness present.     Gait: Gait abnormal.     Comments: Hoyer   Psychiatric:        Mood and Affect: Mood normal.     Comments: Nonverbal, does not follow commands     Labs reviewed: Recent Labs    06/02/21 0000 08/01/21 0000 09/19/21 0000  NA 140 142 139  K 4.6 4.4 4.2  CL 105 108 106  CO2 28* 25* 23*  BUN 24* 25* 27*  CREATININE 0.6 0.6 0.7  CALCIUM 9.1 9.3 9.2   Recent Labs    04/21/21 0000 06/02/21 0000  AST 14 11*  ALT 11 9  ALKPHOS 49 40  ALBUMIN 4.0 3.8   Recent Labs    04/21/21 0000 06/02/21 0000 08/01/21 0000 09/19/21 0000  WBC 8.1 7.1 7.7 6.6  NEUTROABS 5,605.00  --  4,797.00  --   HGB 13.5 12.6 13.6 13.8  HCT 41 38 41 41  PLT 282 193 214 173   Lab Results  Component Value Date   TSH 3.19 12/26/2021   Lab Results  Component Value Date   HGBA1C 6.5 12/26/2021   Lab Results  Component Value Date   CHOL 188 12/26/2021   HDL 60 12/26/2021   LDLCALC 106 12/26/2021   TRIG 119 12/26/2021    Significant Diagnostic Results in last 30 days:  No results found.  Assessment/Plan 1. Pressure injury of left buttock, stage 2 ([place]) - resolved - cont zinc oxide and frequent position changes - cont foam dressing for prevention  2. Frontotemporal dementia with behavioral disturbance ([place]) - followed by hospice - dependent with all ADLs, assisted feeding - nonverbal, tics of fake sneezing less - cont Depakote and Seroquel  3. Weight loss - down 16 lbs in 10 months - BMI stable - see above - cont monthly weights  4. Type 2 diabetes mellitus with peripheral vascular disease (HCC) - A1c stable - off metformin - foot exam done today  5. S/P AKA (above knee amputation), left ([place]) - cont skilled nursing  6. Recurrent UTI -  cont cranberry supplement  7. Essential hypertension - controlled off lisinopril  8. Hyperlipidemia, unspecified hyperlipidemia type - cont statin  9. Acquired hypothyroidism - TSH stable - cont levothyroxine  10. Hyponatremia - Na+ stable - cont sodium tablets  11. Depression with anxiety - no mood changes - cont Celexa and ativan prn    Family/ staff Communication: plan discussed with patient and nurse  Labs/tests ordered:  none

## 2022-03-20 DIAGNOSIS — E119 Type 2 diabetes mellitus without complications: Secondary | ICD-10-CM | POA: Diagnosis not present

## 2022-03-20 DIAGNOSIS — D649 Anemia, unspecified: Secondary | ICD-10-CM | POA: Diagnosis not present

## 2022-03-21 LAB — HEPATIC FUNCTION PANEL
ALT: 7 U/L (ref 7–35)
AST: 11 — AB (ref 13–35)
Alkaline Phosphatase: 42 (ref 25–125)
Bilirubin, Total: 0.5

## 2022-03-21 LAB — BASIC METABOLIC PANEL WITH GFR
BUN: 29 — AB (ref 4–21)
CO2: 28 — AB (ref 13–22)
Chloride: 103 (ref 99–108)
Creatinine: 0.6 (ref 0.5–1.1)
Glucose: 150
Potassium: 4.3 meq/L (ref 3.5–5.1)
Sodium: 139 (ref 137–147)

## 2022-03-21 LAB — COMPREHENSIVE METABOLIC PANEL
Albumin: 3.9 (ref 3.5–5.0)
Calcium: 9.6 (ref 8.7–10.7)
Globulin: 2.8

## 2022-03-21 LAB — CBC AND DIFFERENTIAL
HCT: 45 (ref 36–46)
Hemoglobin: 15.5 (ref 12.0–16.0)
Neutrophils Absolute: 6182
Platelets: 201 10*3/uL (ref 150–400)
WBC: 9.2

## 2022-03-21 LAB — HEMOGLOBIN A1C: Hemoglobin A1C: 7.3

## 2022-03-21 LAB — CBC: RBC: 4.87 (ref 3.87–5.11)

## 2022-03-28 DIAGNOSIS — M6281 Muscle weakness (generalized): Secondary | ICD-10-CM | POA: Diagnosis not present

## 2022-03-28 DIAGNOSIS — R2681 Unsteadiness on feet: Secondary | ICD-10-CM | POA: Diagnosis not present

## 2022-03-31 DIAGNOSIS — M6281 Muscle weakness (generalized): Secondary | ICD-10-CM | POA: Diagnosis not present

## 2022-03-31 DIAGNOSIS — R2681 Unsteadiness on feet: Secondary | ICD-10-CM | POA: Diagnosis not present

## 2022-04-03 DIAGNOSIS — M6281 Muscle weakness (generalized): Secondary | ICD-10-CM | POA: Diagnosis not present

## 2022-04-03 DIAGNOSIS — R2681 Unsteadiness on feet: Secondary | ICD-10-CM | POA: Diagnosis not present

## 2022-04-21 ENCOUNTER — Other Ambulatory Visit: Payer: Self-pay | Admitting: Orthopedic Surgery

## 2022-04-21 DIAGNOSIS — G3109 Other frontotemporal dementia: Secondary | ICD-10-CM

## 2022-04-21 DIAGNOSIS — F418 Other specified anxiety disorders: Secondary | ICD-10-CM

## 2022-04-21 MED ORDER — LORAZEPAM 0.5 MG PO TABS: 0.5000 mg | ORAL_TABLET | ORAL | 0 refills | Status: DC | PRN

## 2022-04-27 ENCOUNTER — Non-Acute Institutional Stay (SKILLED_NURSING_FACILITY): Admitting: Internal Medicine

## 2022-04-27 DIAGNOSIS — G3109 Other frontotemporal dementia: Secondary | ICD-10-CM | POA: Diagnosis not present

## 2022-04-27 DIAGNOSIS — Z89612 Acquired absence of left leg above knee: Secondary | ICD-10-CM

## 2022-04-27 DIAGNOSIS — I10 Essential (primary) hypertension: Secondary | ICD-10-CM

## 2022-04-27 DIAGNOSIS — E871 Hypo-osmolality and hyponatremia: Secondary | ICD-10-CM | POA: Diagnosis not present

## 2022-04-27 DIAGNOSIS — R634 Abnormal weight loss: Secondary | ICD-10-CM | POA: Diagnosis not present

## 2022-04-27 DIAGNOSIS — F02818 Dementia in other diseases classified elsewhere, unspecified severity, with other behavioral disturbance: Secondary | ICD-10-CM | POA: Diagnosis not present

## 2022-04-27 DIAGNOSIS — E1151 Type 2 diabetes mellitus with diabetic peripheral angiopathy without gangrene: Secondary | ICD-10-CM | POA: Diagnosis not present

## 2022-04-27 DIAGNOSIS — F418 Other specified anxiety disorders: Secondary | ICD-10-CM | POA: Diagnosis not present

## 2022-04-27 DIAGNOSIS — E785 Hyperlipidemia, unspecified: Secondary | ICD-10-CM

## 2022-04-27 DIAGNOSIS — E039 Hypothyroidism, unspecified: Secondary | ICD-10-CM | POA: Diagnosis not present

## 2022-04-28 ENCOUNTER — Encounter: Payer: Self-pay | Admitting: Internal Medicine

## 2022-04-28 NOTE — Progress Notes (Signed)
[place]

## 2022-04-29 ENCOUNTER — Encounter: Payer: Self-pay | Admitting: Internal Medicine

## 2022-04-29 NOTE — Progress Notes (Signed)
Location:  [place] of Service:  SNF (31)  Provider:   Code Status: DNR/Hospice Goals of Care:     02/17/2022    1:23 PM  Advanced Directives  Does Patient Have a Medical Advance Directive? Yes  Type of Paramedic of [place];Out of facility DNR (pink MOST or yellow form)  Does patient want to make changes to medical advance directive? No - Patient declined  Copy of [place] in Chart? Yes - validated most recent copy scanned in chart (See row information)     Chief Complaint  Patient presents with   Chronic Care Management    HPI: Patient is a 72 y.o. female seen today for medical management of chronic diseases.    Lives in SNF   Patient was diagnosed with frontotemporal dementia in 2019.  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 She is retired Marine scientist Her sister is the POA    She is stable. No new Nursing issues.  Does have behaviors issues Need ore help with feeding Continues to be aphasic Her weight is stable though overall has lost 10 lbs Hoyer dependent No Falls Wt Readings from Last 3 Encounters:  04/29/22 145 lb 6.4 oz (66 kg)  03/17/22 144 lb 14.4 oz (65.7 kg)  02/17/22 143 lb 9 oz (65.1 kg)    Past Medical History:  Diagnosis Date   Dementia ([place])    Dementia in other diseases classified elsewhere, unspecified severity, with other behavioral disturbance ([place])    Diabetes mellitus without complication ([place])    Essential (primary) hypertension    per matrix   Hx of BKA, left ([place]) 10/24/2020   Hypo-osmolality and hyponatremia    Hypothyroidism, unspecified    Other frontotemporal neurocognitive disorder (CODE) ([place])    Pure  hypercholesterolemia, unspecified    Thyroid disease    Unspecified dementia, unspecified severity, without behavioral disturbance, psychotic disturbance, mood disturbance, and anxiety ([place])    per matrix    No past surgical history on file.  No Known Allergies  Outpatient Encounter Medications as of 04/27/2022  Medication Sig   LORazepam (ATIVAN) 0.5 MG tablet Take 0.5 mg by mouth every 12 (twelve) hours as needed for anxiety.   acetaminophen (TYLENOL) 500 MG tablet Take 1,000 mg by mouth 2 (two) times daily as needed for fever (pain).   AMBULATORY NON FORMULARY MEDICATION [id number]  powder in packet; 100-7.5-2.691 gram; amt: 1 Capful (17 grams); oral  Special Instructions: Mix 1 capful (17 grams) in 4-8 oz of H2O and drink po daily  as needed for constipation Once A Day - PRN   calcium carbonate (OS-CAL) 600 MG TABS tablet Take 600 mg by mouth daily with breakfast.   Camphor-Menthol-Methyl Sal (SALONPAS) 3.02-19-08 % PTCH Place 1 patch onto the skin See admin instructions. Apply one patch transdermally to left aka stump daily as needed for pain   citalopram (CELEXA) 10 MG tablet Take 10 mg by mouth every morning.   Cranberry 450 MG TABS Take by mouth daily.   divalproex (DEPAKOTE) 250 MG DR tablet Take 250 mg by mouth 3 (three) times daily.   Docusate Sodium 100 MG capsule Take 100 mg by mouth 2 (two) times daily.  guaiFENesin (ROBITUSSIN) 100 MG/5ML liquid Take 200 mg by mouth every 4 (four) hours as needed for cough or congestion.   hydrocortisone cream 1 % Apply 1 Application topically 2 (two) times daily.   levothyroxine (SYNTHROID) 50 MCG tablet Take 50 mcg by mouth daily before breakfast.   loperamide (IMODIUM A-D) 2 MG tablet Take 2 mg by mouth every 8 (eight) hours as needed for diarrhea or loose stools.   LORazepam (ATIVAN) 2 MG tablet Take 2 mg by mouth. Take 1 tablet as directed by [facility] staff   melatonin 3 MG TABS tablet Take 3 mg by mouth  at bedtime.   Multiple Vitamins-Minerals (HEALTHY EYES/LUTEIN-ZEAXANTHIN) CAPS Take 1 capsule by mouth every morning. DO NOT CRUSH   NYAMYC powder Apply 1 application topically 2 (two) times daily as needed (groin rash).   polyethylene glycol (MIRALAX / GLYCOLAX) 17 g packet Take 17 g by mouth as needed for mild constipation.   QUEtiapine (SEROQUEL) 25 MG tablet Take 25 mg by mouth every morning.   QUEtiapine (SEROQUEL) 50 MG tablet Take 50 mg by mouth at bedtime.   sodium chloride 1 g tablet Take 1 g by mouth once.   Sodium Fluoride (PREVIDENT 5000 BOOSTER PLUS) 1.1 % PSTE Place onto teeth daily. Brush on teeth with a toothbrush after PM mouth care. Spit out excess and do not rinse   thiamine 100 MG tablet Take 100 mg by mouth every morning. Vitamin B1   vitamin B-12 (CYANOCOBALAMIN) 500 MCG tablet Take 500 mcg by mouth daily.   zinc oxide 20 % ointment Apply 1 application topically 3 (three) times daily as needed (apply to buttocks for redness).   [DISCONTINUED] aspirin 81 MG chewable tablet Chew by mouth daily.   [DISCONTINUED] Insulin Pen Needle 30G X 5 MM MISC 1 applicator by Does not apply route daily.   [DISCONTINUED] LORazepam (ATIVAN) 0.5 MG tablet Take 1 tablet (0.5 mg total) by mouth every 4 (four) hours as needed for anxiety or sedation. 4 times a day PRN (as needed) (Patient taking differently: Take 0.5 mg by mouth 2 (two) times daily as needed for anxiety.)   [DISCONTINUED] simvastatin (ZOCOR) 20 MG tablet Take 20 mg by mouth at bedtime.   Facility-Administered Encounter Medications as of 04/27/2022  Medication   ketoconazole (NIZORAL) 2 % shampoo    Review of Systems:  Review of Systems  Unable to perform ROS: Dementia    Health Maintenance  Topic Date Due   Pneumonia Vaccine 20+ Years old (3 of 3 - PPSV23 or PCV20) 10/13/2022 (Originally 12/09/2021)   COVID-19 Vaccine (7 - 2023-24 season) 10/13/2022 (Originally 03/10/2022)   Zoster Vaccines- Shingrix (1 of 2) 10/13/2022  (Originally 11/26/1969)   OPHTHALMOLOGY EXAM  06/02/2022   HEMOGLOBIN A1C  06/26/2022   FOOT EXAM  03/18/2023   INFLUENZA VACCINE  Completed   Hepatitis C Screening  Completed   HPV VACCINES  Aged Out   DTaP/Tdap/Td  Discontinued   MAMMOGRAM  Discontinued   DEXA SCAN  Discontinued   COLONOSCOPY (Pts 45-56yrs Insurance coverage will need to be confirmed)  Discontinued    Physical Exam: Vitals:   04/29/22 2217  BP: 127/80  Pulse: 99  Resp: 18  Temp: 98.2 F (36.8 C)  SpO2: 99%  Weight: 145 lb 6.4 oz (66 kg)   Body mass index is 24.2 kg/m. Physical Exam Vitals reviewed.  Constitutional:      Appearance: Normal appearance.  HENT:     Head: Normocephalic.  Nose: Nose normal.     Mouth/Throat:     Mouth: Mucous membranes are moist.     Pharynx: Oropharynx is clear.  Eyes:     Pupils: Pupils are equal, round, and reactive to light.  Cardiovascular:     Rate and Rhythm: Normal rate and regular rhythm.     Pulses: Normal pulses.     Heart sounds: Normal heart sounds. No murmur heard. Pulmonary:     Effort: Pulmonary effort is normal.     Breath sounds: Normal breath sounds.  Abdominal:     General: Abdomen is flat. Bowel sounds are normal.     Palpations: Abdomen is soft.  Musculoskeletal:        General: No swelling.     Cervical back: Neck supple.     Comments: S/p AKA left  Skin:    General: Skin is warm.  Neurological:     General: No focal deficit present.     Mental Status: She is alert.  Psychiatric:        Mood and Affect: Mood normal.        Thought Content: Thought content normal.     Labs reviewed: Basic Metabolic Panel: Recent Labs    06/02/21 0000 08/01/21 0000 09/19/21 0000 12/26/21 0000  NA 140 142 139  --   K 4.6 4.4 4.2  --   CL 105 108 106  --   CO2 28* 25* 23*  --   BUN 24* 25* 27*  --   CREATININE 0.6 0.6 0.7  --   CALCIUM 9.1 9.3 9.2  --   TSH  --   --   --  3.19   Liver Function Tests: Recent Labs    06/02/21 0000   AST 11*  ALT 9  ALKPHOS 40  ALBUMIN 3.8   No results for input(s): "LIPASE", "AMYLASE" in the last 8760 hours. No results for input(s): "AMMONIA" in the last 8760 hours. CBC: Recent Labs    06/02/21 0000 08/01/21 0000 09/19/21 0000  WBC 7.1 7.7 6.6  NEUTROABS  --  4,797.00  --   HGB 12.6 13.6 13.8  HCT 38 41 41  PLT 193 214 173   Lipid Panel: Recent Labs    12/26/21 0000  CHOL 188  HDL 60  LDLCALC 106  TRIG 119   Lab Results  Component Value Date   HGBA1C 6.5 12/26/2021    Procedures since last visit: No results found.  Assessment/Plan 1. Frontotemporal dementia with behavioral disturbance ([place]) Enrolled in hospice On Seroquel and Depakote for behaviors  2. Depression with anxiety On celexa  3. Weight loss Enrolled in Hospice  4. Type 2 diabetes mellitus with peripheral vascular disease ([place]) Off all meds   5. S/P AKA (above knee amputation), left ([place]) Total Care  6. Essential hypertension Off all meds   7 Acquired hypothyroidism TSH normal in 11/23  8. Hyponatremia Sodium tabs    Labs/tests ordered:  * No order type specified * Next appt:  Visit date not found

## 2022-05-05 ENCOUNTER — Encounter: Payer: Self-pay | Admitting: Orthopedic Surgery

## 2022-05-05 ENCOUNTER — Non-Acute Institutional Stay (SKILLED_NURSING_FACILITY): Payer: Medicare Other | Admitting: Orthopedic Surgery

## 2022-05-05 DIAGNOSIS — L89312 Pressure ulcer of right buttock, stage 2: Secondary | ICD-10-CM

## 2022-05-05 DIAGNOSIS — L89321 Pressure ulcer of left buttock, stage 1: Secondary | ICD-10-CM

## 2022-05-05 DIAGNOSIS — F02818 Dementia in other diseases classified elsewhere, unspecified severity, with other behavioral disturbance: Secondary | ICD-10-CM | POA: Diagnosis not present

## 2022-05-05 DIAGNOSIS — G3109 Other frontotemporal dementia: Secondary | ICD-10-CM

## 2022-05-05 NOTE — Progress Notes (Signed)
Location:   [place] Room Number: 29-A Place of Service:  ALF [phone]) Provider:  Windell Moulding, NP  PCP: Virgie Dad, MD  Patient Care Team: Virgie Dad, MD as PCP - General (Internal Medicine)  Extended Emergency Contact Information Primary Emergency Contact: [facility] Phone: [phone] Relation: Sister Secondary Emergency Contact: [facility] Phone: [phone] Mobile Phone: [phone] Relation: Brother  Code Status:  DNR Goals of care: Advanced Directive information    05/05/2022   11:47 AM  Advanced Directives  Does Patient Have a Medical Advance Directive? Yes  Type of Paramedic of [place];Living will;Out of facility DNR (pink MOST or yellow form)  Does patient want to make changes to medical advance directive? No - Patient declined  Copy of [place] in Chart? Yes - validated most recent copy scanned in chart (See row information)     Chief Complaint  Patient presents with   Acute Visit    Pressure sores.    HPI:  Pt is a 72 y.o. female seen today for an acute visit due to pressure sores.   She currently resides on the skilled nursing unit at [facility] due to frontotemporal dementia. PMH: HTN, hypothyroidism, T2DM, alcohol abuse, psychosis, wernicke-korsakoff syndrome, thrombocytopenia.     Nursing reports stage II pressure ulcer to right buttocks, stage I to left buttocks. She has advanced frontotemporal dementia> bed bound. Requires hoyer for transfers. Also incontinent of bowel and bladder. She is on a pressure reduction mattress. Nursing started foam dressings. She also has zinc oxide to pressure points.    Past Medical History:  Diagnosis Date   Dementia ([place])    Dementia in other diseases classified elsewhere, unspecified severity, with other behavioral disturbance ([place])    Diabetes mellitus without complication ([employer])    Essential (primary) hypertension     per matrix   Hx of BKA, left ([place]) 10/24/2020   Hypo-osmolality and hyponatremia    Hypothyroidism, unspecified    Other frontotemporal neurocognitive disorder (CODE) ([place])    Pure hypercholesterolemia, unspecified    Thyroid disease    Unspecified dementia, unspecified severity, without behavioral disturbance, psychotic disturbance, mood disturbance, and anxiety ([place])    per matrix   History reviewed. No pertinent surgical history.  Not on File  Allergies as of 05/05/2022   Not on File      Medication List        Accurate as of May 05, 2022 11:47 AM. If you have any questions, ask your nurse or doctor.          STOP taking these medications    AMBULATORY NON FORMULARY MEDICATION Stopped by: Yvonna Alanis, NP   loperamide 2 MG tablet Commonly known as: IMODIUM A-D Stopped by: Yvonna Alanis, NP   thiamine 100 MG tablet Commonly known as: VITAMIN B1 Stopped by: Yvonna Alanis, NP       TAKE these medications    acetaminophen 500 MG tablet Commonly known as: TYLENOL Take 1,000 mg by mouth 2 (two) times daily as needed for fever (pain).   acetaminophen 500 MG tablet Commonly known as: TYLENOL Take 500 mg by mouth 2 (two) times daily.   calcium carbonate 600 MG Tabs tablet Commonly known as: OS-CAL Take 600 mg by mouth daily with breakfast.   citalopram 10 MG tablet Commonly known as: CELEXA Take 10 mg by mouth every morning.   Cranberry 450 MG Tabs Take by mouth daily.  divalproex 125 MG capsule Commonly known as: DEPAKOTE SPRINKLE Take 250 mg by mouth 3 (three) times daily. What changed: Another medication with the same name was removed. Continue taking this medication, and follow the directions you see here. Changed by: Yvonna Alanis, NP   Docusate Sodium 100 MG capsule Take 100 mg by mouth 2 (two) times daily.   guaiFENesin 100 MG/5ML liquid Commonly known as: ROBITUSSIN Take 200 mg by mouth every 4 (four) hours as needed for cough or  congestion.   Healthy Eyes/Lutein-Zeaxanthin Caps Take 1 capsule by mouth every morning. DO NOT CRUSH   hydrocortisone cream 1 % Apply 1 Application topically as needed (Apply to forehead/neck/jawlines).   KETOCONAZOLE (TOPICAL) 1 % Sham Apply 1 Application topically 2 (two) times a week. Monday and Thursday.   levothyroxine 50 MCG tablet Commonly known as: SYNTHROID Take 50 mcg by mouth daily before breakfast.   LORazepam 2 MG tablet Commonly known as: ATIVAN Take 2 mg by mouth as needed. Take 1 tablet as directed by [facility] staff   LORazepam 0.5 MG tablet Commonly known as: ATIVAN Take 0.5 mg by mouth every 12 (twelve) hours as needed for anxiety.   melatonin 3 MG Tabs tablet Take 3 mg by mouth daily.   Nyamyc powder Generic drug: nystatin Apply 1 application  topically as needed (groin rash).   polyethylene glycol 17 g packet Commonly known as: MIRALAX / GLYCOLAX Take 17 g by mouth as needed for mild constipation.   PreviDent 5000 Booster Plus 1.1 % Pste Generic drug: Sodium Fluoride Place onto teeth daily. Brush on teeth with a toothbrush after PM mouth care. Spit out excess and do not rinse   PRO-STAT PO Take 30 mLs by mouth daily.   QUEtiapine 50 MG tablet Commonly known as: SEROQUEL Take 50 mg by mouth at bedtime.   QUEtiapine 25 MG tablet Commonly known as: SEROQUEL Take 25 mg by mouth every morning.   Salonpas 3.02-19-08 % Ptch Generic drug: Camphor-Menthol-Methyl Sal Place 1 patch onto the skin See admin instructions. Apply one patch transdermally to left aka stump daily as needed for pain   sodium chloride 1 g tablet Take 1 g by mouth once.   vitamin B-12 500 MCG tablet Commonly known as: CYANOCOBALAMIN Take 500 mcg by mouth daily.   zinc oxide 20 % ointment Apply 1 application topically 3 (three) times daily as needed (apply to buttocks for redness).        Review of Systems  Unable to perform ROS: Patient nonverbal     Immunization History  Administered Date(s) Administered   Fluad Quad(high Dose 65+) 12/07/2021   Influenza-Unspecified 11/11/2019, 11/11/2020   Janssen (J&J) SARS-COV-2 Vaccination 01/13/2022   Moderna SARS-COV2 Booster Vaccination 02/02/2021   Moderna Sars-Covid-2 Vaccination 03/06/2019, 04/03/2019, 12/18/2019, 11/11/2020   PPD Test 05/05/2019   Pfizer Covid-19 Vaccine Bivalent Booster 106yrs & up 07/27/2021   Pneumococcal Conjugate-13 12/09/2016   Pneumococcal Polysaccharide-23 12/09/2013   Tdap 03/12/2012   Zoster, Live 11/26/2012   Zoster, Unspecified 11/26/2012   Pertinent  Health Maintenance Due  Topic Date Due   OPHTHALMOLOGY EXAM  06/02/2022   HEMOGLOBIN A1C  06/26/2022   FOOT EXAM  03/18/2023   INFLUENZA VACCINE  Completed   MAMMOGRAM  Discontinued   DEXA SCAN  Discontinued   COLONOSCOPY (Pts 45-28yrs Insurance coverage will need to be confirmed)  Discontinued      10/25/2020    7:10 PM 10/26/2020   12:00 PM 03/04/2021  2:48 PM 01/19/2022   11:42 AM 02/17/2022    1:22 PM  Fall Risk  Falls in the past year?   1 0 0  Was there an injury with Fall?   0 0 0  Fall Risk Category Calculator   1 0 0  Fall Risk Category (Retired)   Low Low Low  (RETIRED) Patient Fall Risk Level High fall risk High fall risk Moderate fall risk Low fall risk Low fall risk  Patient at Risk for Falls Due to   History of fall(s);Impaired balance/gait;Impaired mobility No Fall Risks No Fall Risks  Fall risk Follow up   Falls evaluation completed;Education provided;Falls prevention discussed Falls evaluation completed Falls evaluation completed   Functional Status Survey:    Vitals:   05/05/22 1135  BP: 124/79  Pulse: 76  Resp: 20  Temp: (!) 97.4 F (36.3 C)  SpO2: 97%  Weight: 145 lb 6.4 oz (66 kg)  Height: 5\' 5"  (1.651 m)   Body mass index is 24.2 kg/m. Physical Exam Vitals reviewed.  Constitutional:      General: She is not in acute distress. HENT:     Head: Normocephalic.   Eyes:     General:        Right eye: No discharge.        Left eye: No discharge.  Cardiovascular:     Rate and Rhythm: Normal rate and regular rhythm.     Pulses: Normal pulses.     Heart sounds: Normal heart sounds.  Pulmonary:     Effort: Pulmonary effort is normal. No respiratory distress.     Breath sounds: Normal breath sounds. No wheezing.  Abdominal:     General: Bowel sounds are normal.     Palpations: Abdomen is soft.  Musculoskeletal:     Cervical back: Neck supple.     Comments: Left BKA  Skin:    General: Skin is warm and dry.     Capillary Refill: Capillary refill takes less than 2 seconds.     Findings: Lesion present.     Comments: Stage I pressure ulcer 1 cm x 0.9 cm to left buttocks. Stage II pressure ulcer 1.6 cm x 1 cm to right buttocks. No sign of infection. Surrounding skin blanchable/ intact.   Neurological:     General: No focal deficit present.     Mental Status: She is alert. Mental status is at baseline.     Motor: Weakness present.     Gait: Gait abnormal.     Comments: Hoyer/bed bound  Psychiatric:        Mood and Affect: Mood normal.     Labs reviewed: Recent Labs    06/02/21 0000 08/01/21 0000 09/19/21 0000  NA 140 142 139  K 4.6 4.4 4.2  CL 105 108 106  CO2 28* 25* 23*  BUN 24* 25* 27*  CREATININE 0.6 0.6 0.7  CALCIUM 9.1 9.3 9.2   Recent Labs    06/02/21 0000  AST 11*  ALT 9  ALKPHOS 40  ALBUMIN 3.8   Recent Labs    06/02/21 0000 08/01/21 0000 09/19/21 0000  WBC 7.1 7.7 6.6  NEUTROABS  --  4,797.00  --   HGB 12.6 13.6 13.8  HCT 38 41 41  PLT 193 214 173   Lab Results  Component Value Date   TSH 3.19 12/26/2021   Lab Results  Component Value Date   HGBA1C 6.5 12/26/2021   Lab Results  Component Value Date  CHOL 188 12/26/2021   HDL 60 12/26/2021   LDLCALC 106 12/26/2021   TRIG 119 12/26/2021    Significant Diagnostic Results in last 30 days:  No results found.  Assessment/Plan 1. Pressure injury  of right buttock, stage 2 ([place]) - noted 03/21 - increased risk for skin injury due to nonambulatory status and incontinence - cont foam dressings q3 days - cont zinc oxide cream over pressure points - cont frequent turning  2. Pressure injury of left buttock, stage 1 - see above  3. Frontotemporal dementia with behavioral disturbance ([place]) - dependent with ADLs including feedings - progressive weight loss - followed by Hospice - cont skilled nursing - cont Seroquel and Depakote    Family/ staff Communication: plan discussed with patient and nurse  Labs/tests ordered:  none

## 2022-05-10 ENCOUNTER — Non-Acute Institutional Stay (SKILLED_NURSING_FACILITY): Payer: Medicare Other | Admitting: Orthopedic Surgery

## 2022-05-10 ENCOUNTER — Encounter: Payer: Self-pay | Admitting: Orthopedic Surgery

## 2022-05-10 DIAGNOSIS — L853 Xerosis cutis: Secondary | ICD-10-CM

## 2022-05-10 DIAGNOSIS — F02818 Dementia in other diseases classified elsewhere, unspecified severity, with other behavioral disturbance: Secondary | ICD-10-CM | POA: Diagnosis not present

## 2022-05-10 DIAGNOSIS — G3109 Other frontotemporal dementia: Secondary | ICD-10-CM

## 2022-05-10 NOTE — Progress Notes (Signed)
Location:  [facility] Room Number: 29/A Place of Service:  SNF [phone]) Provider:  Yvonna Alanis, NP   Virgie Dad, MD  Patient Care Team: Virgie Dad, MD as PCP - General (Internal Medicine)  Extended Emergency Contact Information Primary Emergency Contact: [facility] Phone: [phone] Relation: Sister Secondary Emergency Contact: [facility] Phone: [phone] Mobile Phone: [phone] Relation: Brother  Code Status:  DNR Goals of care: Advanced Directive information    05/05/2022   11:47 AM  Advanced Directives  Does Patient Have a Medical Advance Directive? Yes  Type of Paramedic of [place];Living will;Out of facility DNR (pink MOST or yellow form)  Does patient want to make changes to medical advance directive? No - Patient declined  Copy of [place] in Chart? Yes - validated most recent copy scanned in chart (See row information)     Chief Complaint  Patient presents with   Acute Visit    Facial rash    HPI:  Pt is a 72 y.o. female seen today for acute visit due to facial rash.   H/o seborrheic dermatitis, shingles to scalp 11/2021 and pressure sores. Nursing reports increased redness and dry flaking skin to eyebrows x 3 days. They have been applying vaseline without success. She is a poor historian due to frontotemporal dementia. Afebrile. Vitals stable.    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  Not on File  Outpatient Encounter Medications as of 05/10/2022  Medication Sig   acetaminophen (TYLENOL) 500 MG tablet Take 1,000 mg by mouth 2 (two) times daily as needed for fever (pain).   acetaminophen (TYLENOL) 500 MG tablet Take 500 mg by mouth 2 (two) times daily.   Amino Acids-Protein Hydrolys (PRO-STAT PO) Take 30 mLs by mouth daily.   calcium carbonate (OS-CAL) 600 MG TABS tablet Take 600 mg by mouth daily with breakfast.   Camphor-Menthol-Methyl Sal (SALONPAS) 3.02-19-08 % PTCH Place 1 patch onto the skin See admin instructions. Apply one patch transdermally to left aka stump daily as needed for pain   citalopram (CELEXA) 10 MG tablet Take 10 mg by mouth every morning.   Cranberry 450 MG TABS Take by mouth daily.   divalproex (DEPAKOTE SPRINKLE) 125 MG capsule Take 250 mg by mouth 3 (three) times daily.   Docusate Sodium 100 MG capsule Take 100 mg by mouth 2 (two) times daily.   guaiFENesin (ROBITUSSIN) 100 MG/5ML liquid Take 200 mg by mouth every 4 (four) hours as needed for cough or congestion.   hydrocortisone cream 1 % Apply 1 Application topically as needed (Apply to forehead/neck/jawlines).   KETOCONAZOLE, TOPICAL, 1 % SHAM Apply 1 Application topically 2 (two) times a week. Monday and Thursday.   levothyroxine (SYNTHROID) 50 MCG tablet Take 50 mcg by mouth daily before breakfast.   LORazepam (ATIVAN) 0.5 MG tablet Take  0.5 mg by mouth every 12 (twelve) hours as needed for anxiety.   LORazepam (ATIVAN) 2 MG tablet Take 2 mg by mouth as needed. Take 1 tablet as directed by [facility] staff   melatonin 3 MG TABS tablet Take 3 mg by mouth daily.   Multiple Vitamins-Minerals (HEALTHY EYES/LUTEIN-ZEAXANTHIN) CAPS Take 1 capsule by mouth every morning. DO NOT CRUSH   NYAMYC powder Apply 1 application  topically as  needed (groin rash).   polyethylene glycol (MIRALAX / GLYCOLAX) 17 g packet Take 17 g by mouth as needed for mild constipation.   QUEtiapine (SEROQUEL) 25 MG tablet Take 25 mg by mouth every morning.   QUEtiapine (SEROQUEL) 50 MG tablet Take 50 mg by mouth at bedtime.   sodium chloride 1 g tablet Take 1 g by mouth once.   Sodium Fluoride (PREVIDENT 5000 BOOSTER PLUS) 1.1 % PSTE Place onto teeth daily. Brush on teeth with a toothbrush after PM mouth care. Spit out excess and do not rinse   vitamin B-12 (CYANOCOBALAMIN) 500 MCG tablet Take 500 mcg by mouth daily.   zinc oxide 20 % ointment Apply 1 application topically 3 (three) times daily as needed (apply to buttocks for redness).   Facility-Administered Encounter Medications as of 05/10/2022  Medication   ketoconazole (NIZORAL) 2 % shampoo    Review of Systems  Unable to perform ROS: Dementia    Immunization History  Administered Date(s) Administered   Fluad Quad(high Dose 65+) 12/07/2021   Influenza-Unspecified 11/11/2019, 11/11/2020   Janssen (J&J) SARS-COV-2 Vaccination 01/13/2022   Moderna SARS-COV2 Booster Vaccination 02/02/2021   Moderna Sars-Covid-2 Vaccination 03/06/2019, 04/03/2019, 12/18/2019, 11/11/2020   PPD Test 05/05/2019   Pfizer Covid-19 Vaccine Bivalent Booster 59yrs & up 07/27/2021   Pneumococcal Conjugate-13 12/09/2016   Pneumococcal Polysaccharide-23 12/09/2013   Tdap 03/12/2012   Zoster, Live 11/26/2012   Zoster, Unspecified 11/26/2012   Pertinent  Health Maintenance Due  Topic Date Due   OPHTHALMOLOGY EXAM  06/02/2022   HEMOGLOBIN A1C  06/26/2022   FOOT EXAM  03/18/2023   INFLUENZA VACCINE  Completed   MAMMOGRAM  Discontinued   DEXA SCAN  Discontinued   COLONOSCOPY (Pts 45-75yrs Insurance coverage will need to be confirmed)  Discontinued      10/25/2020    7:10 PM 10/26/2020   12:00 PM 03/04/2021    2:48 PM 01/19/2022   11:42 AM 02/17/2022    1:22 PM  Fall Risk  Falls in the past year?   1 0 0  Was  there an injury with Fall?   0 0 0  Fall Risk Category Calculator   1 0 0  Fall Risk Category (Retired)   Low Low Low  (RETIRED) Patient Fall Risk Level High fall risk High fall risk Moderate fall risk Low fall risk Low fall risk  Patient at Risk for Falls Due to   History of fall(s);Impaired balance/gait;Impaired mobility No Fall Risks No Fall Risks  Fall risk Follow up   Falls evaluation completed;Education provided;Falls prevention discussed Falls evaluation completed Falls evaluation completed   Functional Status Survey:    Vitals:   05/10/22 1335  BP: 131/81  Pulse: 67  Resp: 20  Temp: (!) 97.4 F (36.3 C)  SpO2: 98%  Weight: 145 lb 6.4 oz (66 kg)  Height: 5\' 5"  (1.651 m)   Body mass index is 24.2 kg/m. Physical Exam Vitals reviewed.  Constitutional:      General: She is not in acute distress. HENT:  Head: Normocephalic.  Eyes:     General:        Right eye: No discharge.        Left eye: No discharge.     Extraocular Movements: Extraocular movements intact.     Pupils: Pupils are equal, round, and reactive to light.  Cardiovascular:     Rate and Rhythm: Normal rate and regular rhythm.     Pulses: Normal pulses.     Heart sounds: Normal heart sounds.  Pulmonary:     Effort: Pulmonary effort is normal.     Breath sounds: Normal breath sounds.  Abdominal:     General: Bowel sounds are normal. There is no distension.     Palpations: Abdomen is soft.     Tenderness: There is no abdominal tenderness.  Musculoskeletal:     Cervical back: Neck supple.     Right lower leg: No edema.     Comments: Left BKA  Skin:    General: Skin is warm and dry.     Capillary Refill: Capillary refill takes less than 2 seconds.     Comments: Dry flaking skin to both eyebrows, mild scratch marks noted, no erythema or blisters  Neurological:     General: No focal deficit present.     Mental Status: She is alert. Mental status is at baseline.     Motor: Weakness present.      Gait: Gait abnormal.  Psychiatric:        Mood and Affect: Mood normal.        Behavior: Behavior normal.     Comments: nonverbal     Labs reviewed: Recent Labs    06/02/21 0000 08/01/21 0000 09/19/21 0000  NA 140 142 139  K 4.6 4.4 4.2  CL 105 108 106  CO2 28* 25* 23*  BUN 24* 25* 27*  CREATININE 0.6 0.6 0.7  CALCIUM 9.1 9.3 9.2   Recent Labs    06/02/21 0000  AST 11*  ALT 9  ALKPHOS 40  ALBUMIN 3.8   Recent Labs    06/02/21 0000 08/01/21 0000 09/19/21 0000  WBC 7.1 7.7 6.6  NEUTROABS  --  4,797.00  --   HGB 12.6 13.6 13.8  HCT 38 41 41  PLT 193 214 173   Lab Results  Component Value Date   TSH 3.19 12/26/2021   Lab Results  Component Value Date   HGBA1C 6.5 12/26/2021   Lab Results  Component Value Date   CHOL 188 12/26/2021   HDL 60 12/26/2021   LDLCALC 106 12/26/2021   TRIG 119 12/26/2021    Significant Diagnostic Results in last 30 days:  No results found.  Assessment/Plan 1. Dry skin dermatitis - dry flaking skin to eyebrows> some scratch marks - did not improve with Vaseline - start triamcinolone 0.5% cream BID x 5 days then BID prn  2. Frontotemporal dementia with behavioral disturbance ([place]) - followed by hospice - nonverbal with tics - progressive weight loss> eating less - dependent with ADLs including feeding - cont Depakote, Seroquel and ativan    Family/ staff Communication: plan discussed with patient and nurse  Labs/tests ordered:  none

## 2022-05-22 ENCOUNTER — Encounter: Payer: Self-pay | Admitting: Orthopedic Surgery

## 2022-05-22 ENCOUNTER — Non-Acute Institutional Stay (SKILLED_NURSING_FACILITY): Payer: Medicare Other | Admitting: Orthopedic Surgery

## 2022-05-22 DIAGNOSIS — Z89612 Acquired absence of left leg above knee: Secondary | ICD-10-CM

## 2022-05-22 DIAGNOSIS — F02818 Dementia in other diseases classified elsewhere, unspecified severity, with other behavioral disturbance: Secondary | ICD-10-CM

## 2022-05-22 DIAGNOSIS — E871 Hypo-osmolality and hyponatremia: Secondary | ICD-10-CM | POA: Diagnosis not present

## 2022-05-22 DIAGNOSIS — E785 Hyperlipidemia, unspecified: Secondary | ICD-10-CM

## 2022-05-22 DIAGNOSIS — L89312 Pressure ulcer of right buttock, stage 2: Secondary | ICD-10-CM | POA: Diagnosis not present

## 2022-05-22 DIAGNOSIS — G3109 Other frontotemporal dementia: Secondary | ICD-10-CM | POA: Diagnosis not present

## 2022-05-22 DIAGNOSIS — E1151 Type 2 diabetes mellitus with diabetic peripheral angiopathy without gangrene: Secondary | ICD-10-CM | POA: Diagnosis not present

## 2022-05-22 DIAGNOSIS — L89321 Pressure ulcer of left buttock, stage 1: Secondary | ICD-10-CM | POA: Diagnosis not present

## 2022-05-22 DIAGNOSIS — R634 Abnormal weight loss: Secondary | ICD-10-CM

## 2022-05-22 DIAGNOSIS — I10 Essential (primary) hypertension: Secondary | ICD-10-CM

## 2022-05-22 DIAGNOSIS — F339 Major depressive disorder, recurrent, unspecified: Secondary | ICD-10-CM

## 2022-05-22 DIAGNOSIS — L853 Xerosis cutis: Secondary | ICD-10-CM | POA: Diagnosis not present

## 2022-05-22 DIAGNOSIS — E039 Hypothyroidism, unspecified: Secondary | ICD-10-CM | POA: Diagnosis not present

## 2022-05-22 NOTE — Progress Notes (Signed)
Location:   [facility] Room Number: 29-A Place of Service:  SNF [id number]) Provider:  Hazle Nordmann, NP  PCP: Mahlon Gammon, MD  Patient Care Team: Mahlon Gammon, MD as PCP - General (Internal Medicine)  Extended Emergency Contact Information Primary Emergency Contact: [facility] Phone: [phone] Relation: Sister Secondary Emergency Contact: [facility] Phone: [phone] Mobile Phone: [phone] Relation: Brother  Code Status:  DNR Goals of care: Advanced Directive information    05/22/2022   10:28 AM  Advanced Directives  Does Patient Have a Medical Advance Directive? Yes  Type of Estate agent of [place];Living will;Out of facility DNR (pink MOST or yellow form)  Does patient want to make changes to medical advance directive? No - Patient declined  Copy of Healthcare Power of Attorney in Chart? Yes - validated most recent copy scanned in chart (See row information)     Chief Complaint  Patient presents with   Medical Management of Chronic Issues    Routine Visit.     HPI:  Pt is a 72 y.o. female seen today for medical management of chronic diseases.    She currently resides on the skilled nursing unit at [facility] due to frontotemporal dementia. PMH: HTN, hypothyroidism, T2DM, alcohol abuse, psychosis, wernicke-korsakoff syndrome, thrombocytopenia.      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 right periventricular basal ganglia, marked severe cerebral atrophy temporal parietal lobes, dependent with all ADLs including feeding, nonverbal most of time, tics of fake sneezing less often, remains on Seroquel and Depakote T2DM- A1c 6.5 12/26/2021, sugars 140-190's, no recent hypoglycemia, not on medication, no future eye exams due to advanced dementia Left BKA- due to acute limb ischemia, ambulates with  wheelchair/hoyer HTN- BUN/creat 27/0.68 09/19/2021, not on medication HLD- LDL 106 12/26/2021, remains on statin Hypothyroidism- TSH 3.19 12/26/2021, remains on levothyroxine Hyponatremia- Na 139 09/19/2021, remains on sodium tablets Depression- remains on Celexa and ativan prn Dry skin dermatitis- resolved with triamcinolone cream  Pressure injury to buttocks-  noted 03/21, nonambulatory, foam dressing changes every 3 days/prn  Recent blood pressures:  04/02- 99/57  03/26- 131/81  03/19- 124/79  Recent weights:  04/01- 148.1 lbs  03/04- 145.4 lbs  02/01- 144.1 lbs    Past Medical History:  Diagnosis Date   Dementia    Dementia in other diseases classified elsewhere, unspecified severity, with other behavioral disturbance    Diabetes mellitus without complication    Essential (primary) hypertension    per matrix   Hx of BKA, left 10/24/2020   Hypo-osmolality and hyponatremia    Hypothyroidism, unspecified    Other frontotemporal neurocognitive disorder (CODE)    Pure hypercholesterolemia, unspecified    Thyroid disease    Unspecified dementia, unspecified severity, without behavioral disturbance, psychotic disturbance, mood disturbance, and anxiety    per matrix   History reviewed. No pertinent surgical history.  No Known Allergies  Allergies as of 05/22/2022   No Known Allergies      Medication List        Accurate as of May 22, 2022 10:28 AM. If you have any questions, ask your nurse or doctor.          acetaminophen 500 MG tablet Commonly known as: TYLENOL Take 1,000 mg by mouth 2 (two) times daily as needed for fever (pain).   acetaminophen 500 MG tablet Commonly known as: TYLENOL Take 500 mg by mouth 2 (  two) times daily.   calcium carbonate 600 MG Tabs tablet Commonly known as: OS-CAL Take 600 mg by mouth daily with breakfast.   citalopram 10 MG tablet Commonly known as: CELEXA Take 10 mg by mouth every morning.   Cranberry 450 MG Tabs Take  by mouth daily.   divalproex 125 MG capsule Commonly known as: DEPAKOTE SPRINKLE Take 250 mg by mouth 3 (three) times daily.   Docusate Sodium 100 MG capsule Take 100 mg by mouth 2 (two) times daily.   guaiFENesin 100 MG/5ML liquid Commonly known as: ROBITUSSIN Take 200 mg by mouth every 4 (four) hours as needed for cough or congestion.   Healthy Eyes/Lutein-Zeaxanthin Caps Take 1 capsule by mouth every morning. DO NOT CRUSH   hydrocortisone cream 1 % Apply 1 Application topically as needed (Apply to forehead/neck/jawlines).   KETOCONAZOLE (TOPICAL) 1 % Sham Apply 1 Application topically 2 (two) times a week. Monday and Thursday.   levothyroxine 50 MCG tablet Commonly known as: SYNTHROID Take 50 mcg by mouth daily before breakfast.   LORazepam 2 MG tablet Commonly known as: ATIVAN Take 2 mg by mouth as needed. Take 1 tablet as directed by [facility] staff   LORazepam 0.5 MG tablet Commonly known as: ATIVAN Take 0.5 mg by mouth every 12 (twelve) hours as needed for anxiety.   melatonin 3 MG Tabs tablet Take 3 mg by mouth daily.   Nyamyc powder Generic drug: nystatin Apply 1 application  topically as needed (groin rash).   polyethylene glycol 17 g packet Commonly known as: MIRALAX / GLYCOLAX Take 17 g by mouth as needed for mild constipation.   PreviDent 5000 Booster Plus 1.1 % Pste Generic drug: Sodium Fluoride Place onto teeth daily. Brush on teeth with a toothbrush after PM mouth care. Spit out excess and do not rinse   PRO-STAT PO Take 30 mLs by mouth daily.   QUEtiapine 50 MG tablet Commonly known as: SEROQUEL Take 50 mg by mouth at bedtime.   QUEtiapine 25 MG tablet Commonly known as: SEROQUEL Take 25 mg by mouth every morning.   Salonpas 3.02-19-08 % Ptch Generic drug: Camphor-Menthol-Methyl Sal Place 1 patch onto the skin See admin instructions. Apply one patch transdermally to left aka stump daily as needed for pain   sodium chloride 1 g  tablet Take 1 g by mouth once.   triamcinolone cream 0.5 % Commonly known as: KENALOG Apply 1 Application topically every 12 (twelve) hours as needed.   vitamin B-12 500 MCG tablet Commonly known as: CYANOCOBALAMIN Take 500 mcg by mouth daily.   zinc oxide 20 % ointment Apply 1 application  topically as needed (apply to buttocks for redness).        Review of Systems  Unable to perform ROS: Dementia    Immunization History  Administered Date(s) Administered   Fluad Quad(high Dose 65+) 12/07/2021   Influenza-Unspecified 11/11/2019, 11/11/2020   Janssen (J&J) SARS-COV-2 Vaccination 01/13/2022   Moderna SARS-COV2 Booster Vaccination 02/02/2021   Moderna Sars-Covid-2 Vaccination 03/06/2019, 04/03/2019, 12/18/2019, 11/11/2020   PPD Test 05/05/2019   Pfizer Covid-19 Vaccine Bivalent Booster 45yrs & up 07/27/2021   Pneumococcal Conjugate-13 12/09/2016   Pneumococcal Polysaccharide-23 12/09/2013   Tdap 03/12/2012   Zoster, Live 11/26/2012   Zoster, Unspecified 11/26/2012   Pertinent  Health Maintenance Due  Topic Date Due   OPHTHALMOLOGY EXAM  06/02/2022   HEMOGLOBIN A1C  06/26/2022   INFLUENZA VACCINE  09/14/2022   FOOT EXAM  03/18/2023   MAMMOGRAM  Discontinued   DEXA SCAN  Discontinued   COLONOSCOPY (Pts 45-66yrs Insurance coverage will need to be confirmed)  Discontinued      10/25/2020    7:10 PM 10/26/2020   12:00 PM 03/04/2021    2:48 PM 01/19/2022   11:42 AM 02/17/2022    1:22 PM  Fall Risk  Falls in the past year?   1 0 0  Was there an injury with Fall?   0 0 0  Fall Risk Category Calculator   1 0 0  Fall Risk Category (Retired)   Low Low Low  (RETIRED) Patient Fall Risk Level High fall risk High fall risk Moderate fall risk Low fall risk Low fall risk  Patient at Risk for Falls Due to   History of fall(s);Impaired balance/gait;Impaired mobility No Fall Risks No Fall Risks  Fall risk Follow up   Falls evaluation completed;Education provided;Falls prevention  discussed Falls evaluation completed Falls evaluation completed   Functional Status Survey:    Vitals:   05/22/22 1020  BP: (!) 99/57  Pulse: 77  Resp: 20  Temp: (!) 97.2 F (36.2 C)  SpO2: 96%  Weight: 148 lb 1.6 oz (67.2 kg)  Height: 5\' 5"  (1.651 m)   Body mass index is 24.65 kg/m. Physical Exam Vitals reviewed.  Constitutional:      General: She is not in acute distress. HENT:     Head: Normocephalic.     Right Ear: There is impacted cerumen.     Left Ear: There is impacted cerumen.     Nose: Nose normal.     Mouth/Throat:     Mouth: Mucous membranes are moist.  Eyes:     General:        Right eye: No discharge.        Left eye: No discharge.  Cardiovascular:     Rate and Rhythm: Normal rate and regular rhythm.     Pulses: Normal pulses.     Heart sounds: Normal heart sounds.  Pulmonary:     Effort: Pulmonary effort is normal. No respiratory distress.     Breath sounds: Normal breath sounds. No wheezing or rales.  Abdominal:     General: Bowel sounds are normal. There is no distension.     Palpations: Abdomen is soft.     Tenderness: There is no abdominal tenderness.  Musculoskeletal:     Cervical back: Neck supple.     Right lower leg: No edema.     Comments: Left BKA  Lymphadenopathy:     Cervical: No cervical adenopathy.  Skin:    General: Skin is warm and dry.     Capillary Refill: Capillary refill takes less than 2 seconds.  Neurological:     General: No focal deficit present.     Mental Status: She is alert. Mental status is at baseline.  Psychiatric:        Mood and Affect: Mood normal.     Comments: Nonverbal, does not follow commands     Labs reviewed: Recent Labs    06/02/21 0000 08/01/21 0000 09/19/21 0000  NA 140 142 139  K 4.6 4.4 4.2  CL 105 108 106  CO2 28* 25* 23*  BUN 24* 25* 27*  CREATININE 0.6 0.6 0.7  CALCIUM 9.1 9.3 9.2   Recent Labs    06/02/21 0000  AST 11*  ALT 9  ALKPHOS 40  ALBUMIN 3.8   Recent Labs     06/02/21 0000 08/01/21 0000 09/19/21 0000  WBC  7.1 7.7 6.6  NEUTROABS  --  4,797.00  --   HGB 12.6 13.6 13.8  HCT 38 41 41  PLT 193 214 173   Lab Results  Component Value Date   TSH 3.19 12/26/2021   Lab Results  Component Value Date   HGBA1C 6.5 12/26/2021   Lab Results  Component Value Date   CHOL 188 12/26/2021   HDL 60 12/26/2021   LDLCALC 106 12/26/2021   TRIG 119 12/26/2021    Significant Diagnostic Results in last 30 days:  No results found.  Assessment/Plan 1. Weight loss - ongoing - BMI 24.65 - cont supervised feedings - cont monthly weights  2. Frontotemporal dementia with behavioral disturbance - followed by hospice - dependent with all ADLs - cont Depakote and Seroquel  3. Type 2 diabetes mellitus with peripheral vascular disease - A1c stable - off metformin due to weight loss - sugars 140-190's, no hypoglycemia - cont diet control - no further eye exams per goals of care  4. S/P AKA (above knee amputation), left - cont skilled nursing  5. Essential hypertension - controlled without medication  6. Hyperlipidemia, unspecified hyperlipidemia type - cont statin  7. Acquired hypothyroidism - TSH stable - cont levothyroxine  8. Hyponatremia - cont sodium tablets  9. Depression, recurrent - no mood changes - unable to complete PH2 due to aphasia/ dementia - cont Celexa and ativan prn  10. Dry skin dermatitis - resolved with triamcinolone  11. Pressure injury of right buttock, stage 2 - improved - cont foam dressing changes and frequent position changing  12. Pressure injury of left buttock, stage 1 - improved - cont foam dressing changes and frequent position changing     Family/ staff Communication: plan discussed with patient and nurse  Labs/tests ordered: none

## 2022-05-26 ENCOUNTER — Other Ambulatory Visit: Payer: Self-pay | Admitting: Orthopedic Surgery

## 2022-05-26 DIAGNOSIS — F02818 Dementia in other diseases classified elsewhere, unspecified severity, with other behavioral disturbance: Secondary | ICD-10-CM

## 2022-05-26 MED ORDER — LORAZEPAM 0.5 MG PO TABS: 0.5000 mg | ORAL_TABLET | Freq: Two times a day (BID) | ORAL | 3 refills | Status: DC | PRN

## 2022-05-29 DIAGNOSIS — E119 Type 2 diabetes mellitus without complications: Secondary | ICD-10-CM | POA: Diagnosis not present

## 2022-05-29 DIAGNOSIS — H2513 Age-related nuclear cataract, bilateral: Secondary | ICD-10-CM | POA: Diagnosis not present

## 2022-05-29 LAB — HM DIABETES EYE EXAM

## 2022-06-06 ENCOUNTER — Other Ambulatory Visit: Payer: Self-pay

## 2022-06-14 ENCOUNTER — Non-Acute Institutional Stay (SKILLED_NURSING_FACILITY): Payer: Medicare Other | Admitting: Orthopedic Surgery

## 2022-06-14 ENCOUNTER — Encounter: Payer: Self-pay | Admitting: Orthopedic Surgery

## 2022-06-14 DIAGNOSIS — G3109 Other frontotemporal dementia: Secondary | ICD-10-CM

## 2022-06-14 DIAGNOSIS — F02818 Dementia in other diseases classified elsewhere, unspecified severity, with other behavioral disturbance: Secondary | ICD-10-CM | POA: Diagnosis not present

## 2022-06-14 DIAGNOSIS — D692 Other nonthrombocytopenic purpura: Secondary | ICD-10-CM

## 2022-06-14 DIAGNOSIS — E1151 Type 2 diabetes mellitus with diabetic peripheral angiopathy without gangrene: Secondary | ICD-10-CM | POA: Diagnosis not present

## 2022-06-14 DIAGNOSIS — F339 Major depressive disorder, recurrent, unspecified: Secondary | ICD-10-CM

## 2022-06-14 DIAGNOSIS — R634 Abnormal weight loss: Secondary | ICD-10-CM

## 2022-06-14 DIAGNOSIS — Z89612 Acquired absence of left leg above knee: Secondary | ICD-10-CM | POA: Diagnosis not present

## 2022-06-14 DIAGNOSIS — E039 Hypothyroidism, unspecified: Secondary | ICD-10-CM

## 2022-06-14 DIAGNOSIS — E1169 Type 2 diabetes mellitus with other specified complication: Secondary | ICD-10-CM | POA: Diagnosis not present

## 2022-06-14 DIAGNOSIS — E782 Mixed hyperlipidemia: Secondary | ICD-10-CM

## 2022-06-14 DIAGNOSIS — E871 Hypo-osmolality and hyponatremia: Secondary | ICD-10-CM

## 2022-06-14 DIAGNOSIS — I10 Essential (primary) hypertension: Secondary | ICD-10-CM | POA: Diagnosis not present

## 2022-06-14 NOTE — Progress Notes (Signed)
Location:   [facility]     Nursing Home Room Number: 29-A Place of Service:  SNF [phone]) Provider:  Hazle Nordmann, NP  PCP: Mahlon Gammon, MD  Patient Care Team: Mahlon Gammon, MD as PCP - General (Internal Medicine)  Extended Emergency Contact Information Primary Emergency Contact: [facility] Phone: [phone] Relation: Sister Secondary Emergency Contact: [facility] Phone: [phone] Mobile Phone: [phone] Relation: Brother  Code Status:  DNR Goals of care: Advanced Directive information    06/14/2022   10:24 AM  Advanced Directives  Does Patient Have a Medical Advance Directive? Yes  Type of Estate agent of [place];Living will;Out of facility DNR (pink MOST or yellow form)  Does patient want to make changes to medical advance directive? No - Patient declined  Copy of Healthcare Power of Attorney in Chart? Yes - validated most recent copy scanned in chart (See row information)     Chief Complaint  Patient presents with   Medical Management of Chronic Issues    Routine Visit.     HPI:  Pt is a 72 y.o. female seen today for medical management of chronic diseases.    She currently resides on the skilled nursing unit at [facility] due to frontotemporal dementia. PMH: HTN, hypothyroidism, T2DM, alcohol abuse, psychosis, wernicke-korsakoff syndrome, thrombocytopenia.      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   Recent weights:  04/07- 148.1 lbs  03/04- 145.4 lbs  02/01- 144.9 lbs  Recent blood pressures:  04/30- 118/65  04/23- 116/60  04/16- 136/70   Past Medical History:  Diagnosis Date   Dementia (HCC)    Dementia in other diseases classified elsewhere, unspecified severity, with other behavioral disturbance (HCC)    Diabetes mellitus without complication (HCC)    Essential (primary) hypertension    per matrix   Hx of BKA, left (HCC) 10/24/2020   Hypo-osmolality and hyponatremia    Hypothyroidism, unspecified    Other frontotemporal neurocognitive disorder (CODE) (HCC)    Pure hypercholesterolemia, unspecified    Thyroid disease    Unspecified dementia, unspecified severity, without behavioral disturbance, psychotic disturbance, mood disturbance, and anxiety (HCC)    per matrix   History reviewed. No pertinent surgical history.  No Known Allergies  Allergies as of 06/14/2022   No Known Allergies      Medication List        Accurate as of Jun 14, 2022 10:24 AM. If you have any questions, ask your nurse or doctor.          acetaminophen 500 MG tablet Commonly known as: TYLENOL Take 1,000 mg by mouth 2 (two) times daily as needed for fever (pain).   acetaminophen 500 MG tablet Commonly known as: TYLENOL Take 500 mg by mouth 2 (two) times daily.   calcium  carbonate 600 MG Tabs tablet Commonly known as: OS-CAL Take 600 mg by mouth daily with breakfast.   citalopram 10 MG tablet Commonly known as: CELEXA Take 10 mg by mouth every morning.   Cranberry 450 MG Tabs Take by mouth daily.   divalproex 125 MG capsule Commonly known as: DEPAKOTE  SPRINKLE Take 250 mg by mouth 3 (three) times daily.   Docusate Sodium 100 MG capsule Take 100 mg by mouth 2 (two) times daily.   guaiFENesin 100 MG/5ML liquid Commonly known as: ROBITUSSIN Take 200 mg by mouth every 4 (four) hours as needed for cough or congestion.   Healthy Eyes/Lutein-Zeaxanthin Caps Take 1 capsule by mouth every morning. DO NOT CRUSH   hydrocortisone cream 1 % Apply 1 Application topically as needed (Apply to forehead/neck/jawlines).   KETOCONAZOLE (TOPICAL) 1 % Sham Apply 1 Application topically 2 (two) times a week. Monday and Thursday.   levothyroxine 50 MCG tablet Commonly known as: SYNTHROID Take 50 mcg by mouth daily before breakfast.   LORazepam 2 MG tablet Commonly known as: ATIVAN Take 2 mg by mouth as needed. Take 1 tablet as directed by [facility] staff   LORazepam 0.5 MG tablet Commonly known as: ATIVAN Take 1 tablet (0.5 mg total) by mouth every 12 (twelve) hours as needed for anxiety.   melatonin 3 MG Tabs tablet Take 3 mg by mouth daily.   Nyamyc powder Generic drug: nystatin Apply 1 application  topically as needed (groin rash).   polyethylene glycol 17 g packet Commonly known as: MIRALAX / GLYCOLAX Take 17 g by mouth as needed for mild constipation.   PreviDent 5000 Booster Plus 1.1 % Pste Generic drug: Sodium Fluoride Place onto teeth daily. Brush on teeth with a toothbrush after PM mouth care. Spit out excess and do not rinse   PRO-STAT PO Take 30 mLs by mouth daily.   QUEtiapine 50 MG tablet Commonly known as: SEROQUEL Take 50 mg by mouth at bedtime.   QUEtiapine 25 MG tablet Commonly known as: SEROQUEL Take 25 mg by mouth every morning.   Salonpas 3.02-19-08 % Ptch Generic drug: Camphor-Menthol-Methyl Sal Place 1 patch onto the skin See admin instructions. Apply one patch transdermally to left aka stump daily as needed for pain   sodium chloride 1 g tablet Take 1 g by mouth once.   triamcinolone cream 0.5  % Commonly known as: KENALOG Apply 1 Application topically every 12 (twelve) hours as needed.   vitamin B-12 500 MCG tablet Commonly known as: CYANOCOBALAMIN Take 500 mcg by mouth daily.   zinc oxide 20 % ointment Apply 1 application  topically as needed (apply to buttocks for redness).        Review of Systems  Unable to perform ROS: Dementia    Immunization History  Administered Date(s) Administered   Fluad Quad(high Dose 65+) 12/07/2021   Influenza-Unspecified 11/11/2019, 11/11/2020   Janssen (J&J) SARS-COV-2 Vaccination 01/13/2022   Moderna SARS-COV2 Booster Vaccination 02/02/2021   Moderna Sars-Covid-2 Vaccination 03/06/2019, 04/03/2019, 12/18/2019, 11/11/2020   PPD Test 05/05/2019   Pfizer Covid-19 Vaccine Bivalent Booster 61yrs & up 07/27/2021   Pneumococcal Conjugate-13 12/09/2016   Pneumococcal Polysaccharide-23 12/09/2013   Tdap 03/12/2012   Zoster, Live 11/26/2012   Zoster, Unspecified 11/26/2012   Pertinent  Health Maintenance Due  Topic Date Due   HEMOGLOBIN A1C  06/26/2022   INFLUENZA VACCINE  09/14/2022   FOOT EXAM  03/18/2023   OPHTHALMOLOGY EXAM  05/29/2023   MAMMOGRAM  Discontinued  DEXA SCAN  Discontinued   COLONOSCOPY (Pts 45-29yrs Insurance coverage will need to be confirmed)  Discontinued      10/26/2020   12:00 PM 03/04/2021    2:48 PM 01/19/2022   11:42 AM 02/17/2022    1:22 PM 05/22/2022    1:02 PM  Fall Risk  Falls in the past year?  1 0 0 0  Was there an injury with Fall?  0 0 0 0  Fall Risk Category Calculator  1 0 0 0  Fall Risk Category (Retired)  Low Low Low   (RETIRED) Patient Fall Risk Level High fall risk Moderate fall risk Low fall risk Low fall risk   Patient at Risk for Falls Due to  History of fall(s);Impaired balance/gait;Impaired mobility No Fall Risks No Fall Risks History of fall(s);Impaired balance/gait;Impaired mobility;Mental status change  Fall risk Follow up  Falls evaluation completed;Education provided;Falls  prevention discussed Falls evaluation completed Falls evaluation completed Falls evaluation completed;Education provided;Falls prevention discussed   Functional Status Survey:    Vitals:   06/14/22 1016  BP: 118/65  Pulse: 80  Resp: 18  Temp: (!) 97.3 F (36.3 C)  SpO2: 96%  Weight: 148 lb 1.6 oz (67.2 kg)  Height: 5\' 5"  (1.651 m)   Body mass index is 24.65 kg/m. Physical Exam Vitals reviewed.  Constitutional:      General: She is not in acute distress. HENT:     Head: Normocephalic.  Eyes:     General:        Right eye: No discharge.        Left eye: No discharge.  Cardiovascular:     Rate and Rhythm: Normal rate and regular rhythm.     Pulses: Normal pulses.     Heart sounds: Normal heart sounds.  Pulmonary:     Effort: Pulmonary effort is normal. No respiratory distress.     Breath sounds: Normal breath sounds. No wheezing.  Abdominal:     General: Bowel sounds are normal. There is no distension.     Palpations: Abdomen is soft.     Tenderness: There is no abdominal tenderness.  Musculoskeletal:     Cervical back: Neck supple.     Right lower leg: No edema.     Comments: Left BKA  Skin:    General: Skin is warm and dry.     Capillary Refill: Capillary refill takes less than 2 seconds.     Comments: Non tender purple lesions to BUE, R>L, no skin breakdown  Neurological:     General: No focal deficit present.     Mental Status: She is alert. Mental status is at baseline.     Motor: Weakness present.     Gait: Gait abnormal.     Comments: Hoyer transfer  Psychiatric:        Mood and Affect: Mood normal.     Comments: Nonverbal, does not follow commands     Labs reviewed: Recent Labs    08/01/21 0000 09/19/21 0000  NA 142 139  K 4.4 4.2  CL 108 106  CO2 25* 23*  BUN 25* 27*  CREATININE 0.6 0.7  CALCIUM 9.3 9.2   No results for input(s): "AST", "ALT", "ALKPHOS", "BILITOT", "PROT", "ALBUMIN" in the last 8760 hours. Recent Labs    08/01/21 0000  09/19/21 0000  WBC 7.7 6.6  NEUTROABS 4,797.00  --   HGB 13.6 13.8  HCT 41 41  PLT 214 173   Lab Results  Component Value Date  TSH 3.19 12/26/2021   Lab Results  Component Value Date   HGBA1C 6.5 12/26/2021   Lab Results  Component Value Date   CHOL 188 12/26/2021   HDL 60 12/26/2021   LDLCALC 106 12/26/2021   TRIG 119 12/26/2021    Significant Diagnostic Results in last 30 days:  No results found.  Assessment/Plan 1. Senile purpura (HCC) - involves BUE, R>L - education given to nursing and patient  2. Weight loss - BMI 24.65 - protein 6.7, albumin 3.9 03/20/2022 - weight down 12 lbs from last year - cont monthly weights  3. Frontotemporal dementia with behavioral disturbance (HCC) - no behaviors - sometimes fake sneezing - dependent with ADLs including feedings - some weight loss- see above - cont Seroquel and Depakote  4. Type 2 diabetes mellitus with peripheral vascular disease (HCC) - A1c 7.3 03/20/2022 - no hypoglycemias - not on medication - Eye Exam 05/29/2022> no recommendations/ retinopathy - cont regular diet  5. Mixed diabetic hyperlipidemia associated with type 2 diabetes mellitus (HCC) - LDL 106 12/2021, goal < 70 - cont statin  6. S/P AKA (above knee amputation), left (HCC) - cont skilled nursing care  7. Essential hypertension - controlled without medication  8. Acquired hypothyroidism - TSH stable - cont levothyroxine  9. Hyponatremia - Na+ stable - cont sodium tablets  10. Depression, recurrent (HCC) - no mood changes - cont Remeron and Celexa   Family/ staff Communication: plan discussed with patient and nurse  Labs/tests ordered: none

## 2022-06-29 ENCOUNTER — Encounter: Payer: Self-pay | Admitting: Orthopedic Surgery

## 2022-06-29 ENCOUNTER — Non-Acute Institutional Stay (SKILLED_NURSING_FACILITY): Payer: Medicare Other | Admitting: Orthopedic Surgery

## 2022-06-29 DIAGNOSIS — B372 Candidiasis of skin and nail: Secondary | ICD-10-CM | POA: Diagnosis not present

## 2022-06-29 MED ORDER — FLUCONAZOLE 150 MG PO TABS
150.0000 mg | ORAL_TABLET | Freq: Once | ORAL | 0 refills | Status: AC
Start: 2022-06-29 — End: 2022-06-29

## 2022-06-29 MED ORDER — NYSTATIN 100000 UNIT/GM EX POWD
1.0000 | Freq: Two times a day (BID) | CUTANEOUS | 0 refills | Status: AC
Start: 2022-06-29 — End: 2022-07-09

## 2022-06-29 NOTE — Progress Notes (Signed)
Location:   [facility] Room Number: 29-A Place of Service:  SNF [phone]) Provider:  Hazle Nordmann, NP  PCP: Mahlon Gammon, MD  Patient Care Team: Mahlon Gammon, MD as PCP - General (Internal Medicine)  Extended Emergency Contact Information Primary Emergency Contact: [facility] Phone: [phone] Relation: Sister Secondary Emergency Contact: [facility] Phone: [phone] Mobile Phone: [phone] Relation: Brother  Code Status:  DNR Goals of care: Advanced Directive information    06/29/2022    9:29 AM  Advanced Directives  Does Patient Have a Medical Advance Directive? Yes  Type of Estate agent of [place];Living will;Out of facility DNR (pink MOST or yellow form)  Does patient want to make changes to medical advance directive? No - Patient declined  Copy of Healthcare Power of Attorney in Chart? Yes - validated most recent copy scanned in chart (See row information)     Chief Complaint  Patient presents with   Acute Visit    Rash    HPI:  Pt is a 72 y.o. female seen today for an acute visit due to rash.   She currently resides on the skilled nursing unit at [facility] due to frontotemporal dementia. PMH: HTN, hypothyroidism, T2DM, alcohol abuse, psychosis, wernicke-korsakoff syndrome, thrombocytopenia.     Nursing reports increased groin/ vulva/ labia redness and swelling. She is a poor historian due to frontotemporal dementia and aphasia. She is prescribed nystatin powder prn. Afebrile. Vitals stable.    Past Medical History:  Diagnosis Date   Dementia (HCC)    Dementia in other diseases classified elsewhere, unspecified severity, with other behavioral disturbance (HCC)    Diabetes mellitus without complication (HCC)    Essential (primary) hypertension    per matrix   Hx of BKA, left (HCC) 10/24/2020   Hypo-osmolality and hyponatremia    Hypothyroidism, unspecified    Other frontotemporal  neurocognitive disorder (CODE) (HCC)    Pure hypercholesterolemia, unspecified    Thyroid disease    Unspecified dementia, unspecified severity, without behavioral disturbance, psychotic disturbance, mood disturbance, and anxiety (HCC)    per matrix   History reviewed. No pertinent surgical history.  No Known Allergies  Allergies as of 06/29/2022   No Known Allergies      Medication List        Accurate as of Jun 29, 2022  9:29 AM. If you have any questions, ask your nurse or doctor.          acetaminophen 500 MG tablet Commonly known as: TYLENOL Take 1,000 mg by mouth 2 (two) times daily as needed for fever (pain).   acetaminophen 500 MG tablet Commonly known as: TYLENOL Take 500 mg by mouth 2 (two) times daily.   calcium carbonate 600 MG Tabs tablet Commonly known as: OS-CAL Take 600 mg by mouth daily with breakfast.   citalopram 10 MG tablet Commonly known as: CELEXA Take 10 mg by mouth every morning.   Cranberry 450 MG Tabs Take by mouth daily.   divalproex 125 MG capsule Commonly known as: DEPAKOTE SPRINKLE Take 250 mg by mouth 3 (three) times daily.   Docusate Sodium 100 MG capsule Take 100 mg by mouth 2 (two) times daily.   guaiFENesin 100 MG/5ML liquid Commonly known as: ROBITUSSIN Take 200 mg by mouth every 4 (four) hours as needed for cough or congestion.   Healthy Eyes/Lutein-Zeaxanthin Caps Take 1 capsule by mouth every morning. DO NOT CRUSH   hydrocortisone cream 1 % Apply 1 Application topically  as needed (Apply to forehead/neck/jawlines).   KETOCONAZOLE (TOPICAL) 1 % Sham Apply 1 Application topically 2 (two) times a week. Monday and Thursday.   levothyroxine 50 MCG tablet Commonly known as: SYNTHROID Take 50 mcg by mouth daily before breakfast.   LORazepam 2 MG tablet Commonly known as: ATIVAN Take 2 mg by mouth as needed. Take 1 tablet as directed by [facility] staff   LORazepam 0.5 MG tablet Commonly known as:  ATIVAN Take 1 tablet (0.5 mg total) by mouth every 12 (twelve) hours as needed for anxiety.   melatonin 3 MG Tabs tablet Take 3 mg by mouth daily.   Nyamyc powder Generic drug: nystatin Apply 1 application  topically as needed (groin rash).   polyethylene glycol 17 g packet Commonly known as: MIRALAX / GLYCOLAX Take 17 g by mouth as needed for mild constipation.   PreviDent 5000 Booster Plus 1.1 % Pste Generic drug: Sodium Fluoride Place onto teeth daily. Brush on teeth with a toothbrush after PM mouth care. Spit out excess and do not rinse   PRO-STAT PO Take 30 mLs by mouth daily.   QUEtiapine 50 MG tablet Commonly known as: SEROQUEL Take 50 mg by mouth at bedtime.   QUEtiapine 25 MG tablet Commonly known as: SEROQUEL Take 25 mg by mouth every morning.   Salonpas 3.02-19-08 % Ptch Generic drug: Camphor-Menthol-Methyl Sal Place 1 patch onto the skin See admin instructions. Apply one patch transdermally to left aka stump daily as needed for pain   sodium chloride 1 g tablet Take 1 g by mouth once.   triamcinolone cream 0.5 % Commonly known as: KENALOG Apply 1 Application topically every 12 (twelve) hours as needed.   vitamin B-12 500 MCG tablet Commonly known as: CYANOCOBALAMIN Take 500 mcg by mouth daily.   zinc oxide 20 % ointment Apply 1 application  topically as needed (apply to buttocks for redness).        Review of Systems  Unable to perform ROS: Dementia    Immunization History  Administered Date(s) Administered   Fluad Quad(high Dose 65+) 12/07/2021   Influenza-Unspecified 11/11/2019, 11/11/2020   Janssen (J&J) SARS-COV-2 Vaccination 01/13/2022   Moderna SARS-COV2 Booster Vaccination 02/02/2021   Moderna Sars-Covid-2 Vaccination 03/06/2019, 04/03/2019, 12/18/2019, 11/11/2020   PPD Test 05/05/2019   Pfizer Covid-19 Vaccine Bivalent Booster 64yrs & up 07/27/2021   Pneumococcal Conjugate-13 12/09/2016   Pneumococcal Polysaccharide-23 12/09/2013    Tdap 03/12/2012   Zoster, Live 11/26/2012   Zoster, Unspecified 11/26/2012   Pertinent  Health Maintenance Due  Topic Date Due   INFLUENZA VACCINE  09/14/2022   HEMOGLOBIN A1C  09/19/2022   FOOT EXAM  03/18/2023   OPHTHALMOLOGY EXAM  05/29/2023   MAMMOGRAM  Discontinued   DEXA SCAN  Discontinued   COLONOSCOPY (Pts 45-25yrs Insurance coverage will need to be confirmed)  Discontinued      10/26/2020   12:00 PM 03/04/2021    2:48 PM 01/19/2022   11:42 AM 02/17/2022    1:22 PM 05/22/2022    1:02 PM  Fall Risk  Falls in the past year?  1 0 0 0  Was there an injury with Fall?  0 0 0 0  Fall Risk Category Calculator  1 0 0 0  Fall Risk Category (Retired)  Low Low Low   (RETIRED) Patient Fall Risk Level High fall risk Moderate fall risk Low fall risk Low fall risk   Patient at Risk for Falls Due to  History of fall(s);Impaired balance/gait;Impaired mobility  No Fall Risks No Fall Risks History of fall(s);Impaired balance/gait;Impaired mobility;Mental status change  Fall risk Follow up  Falls evaluation completed;Education provided;Falls prevention discussed Falls evaluation completed Falls evaluation completed Falls evaluation completed;Education provided;Falls prevention discussed   Functional Status Survey:    Vitals:   06/29/22 0924  BP: 127/78  Pulse: 85  Resp: 18  Temp: 98 F (36.7 C)  SpO2: 96%  Weight: 151 lb 12.8 oz (68.9 kg)  Height: 5\' 5"  (1.651 m)   Body mass index is 25.26 kg/m. Physical Exam Vitals reviewed.  Constitutional:      General: She is not in acute distress. HENT:     Head: Normocephalic.  Eyes:     General:        Right eye: No discharge.        Left eye: No discharge.  Cardiovascular:     Rate and Rhythm: Normal rate and regular rhythm.     Pulses: Normal pulses.     Heart sounds: Normal heart sounds.  Pulmonary:     Effort: Pulmonary effort is normal.     Breath sounds: Normal breath sounds.  Abdominal:     Palpations: Abdomen is soft.   Genitourinary:    Labia:        Right: Rash present.        Left: Rash present.      Comments: Increased redness with mild swelling to labia, exam limited due to positioning  Musculoskeletal:     Cervical back: Neck supple.     Right lower leg: No edema.     Comments: Left BKA  Skin:    Capillary Refill: Capillary refill takes less than 2 seconds.     Findings: Rash present.     Comments: Excoriated skin to groin skin folds  Neurological:     General: No focal deficit present.     Mental Status: She is alert. Mental status is at baseline.  Psychiatric:        Mood and Affect: Mood normal.     Labs reviewed: Recent Labs    08/01/21 0000 09/19/21 0000 03/21/22 0000  NA 142 139 139  K 4.4 4.2 4.3  CL 108 106 103  CO2 25* 23* 28*  BUN 25* 27* 29*  CREATININE 0.6 0.7 0.6  CALCIUM 9.3 9.2 9.6   Recent Labs    03/21/22 0000  AST 11*  ALT 7  ALKPHOS 42  ALBUMIN 3.9   Recent Labs    08/01/21 0000 09/19/21 0000 03/21/22 0000  WBC 7.7 6.6 9.2  NEUTROABS 4,797.00  --  6,182.00  HGB 13.6 13.8 15.5  HCT 41 41 45  PLT 214 173 201   Lab Results  Component Value Date   TSH 3.19 12/26/2021   Lab Results  Component Value Date   HGBA1C 7.3 03/21/2022   Lab Results  Component Value Date   CHOL 188 12/26/2021   HDL 60 12/26/2021   LDLCALC 106 12/26/2021   TRIG 119 12/26/2021    Significant Diagnostic Results in last 30 days:  No results found.  Assessment/Plan: 1. Candidal skin infection - excoriated groin folds, labia with minor redness/swelling - suspect years due to T2DM, incontinence - fluconazole (DIFLUCAN) 150 MG tablet; Take 1 tablet (150 mg total) by mouth once for 1 dose.  Dispense: 1 tablet; Refill: 0 - nystatin (MYCOSTATIN/NYSTOP) powder; Apply 1 Application topically 2 (two) times daily for 10 days.  Dispense: 15 g; Refill: 0    Family/ staff Communication: plan  discussed with patient and nurse  Labs/tests ordered:   none

## 2022-07-20 ENCOUNTER — Non-Acute Institutional Stay (SKILLED_NURSING_FACILITY): Payer: Medicare Other | Admitting: Internal Medicine

## 2022-07-20 DIAGNOSIS — G3109 Other frontotemporal dementia: Secondary | ICD-10-CM

## 2022-07-20 DIAGNOSIS — F339 Major depressive disorder, recurrent, unspecified: Secondary | ICD-10-CM

## 2022-07-20 DIAGNOSIS — Z89612 Acquired absence of left leg above knee: Secondary | ICD-10-CM | POA: Diagnosis not present

## 2022-07-20 DIAGNOSIS — I10 Essential (primary) hypertension: Secondary | ICD-10-CM | POA: Diagnosis not present

## 2022-07-20 DIAGNOSIS — F02818 Dementia in other diseases classified elsewhere, unspecified severity, with other behavioral disturbance: Secondary | ICD-10-CM

## 2022-07-20 DIAGNOSIS — E039 Hypothyroidism, unspecified: Secondary | ICD-10-CM

## 2022-07-20 DIAGNOSIS — E1151 Type 2 diabetes mellitus with diabetic peripheral angiopathy without gangrene: Secondary | ICD-10-CM | POA: Diagnosis not present

## 2022-07-20 DIAGNOSIS — E871 Hypo-osmolality and hyponatremia: Secondary | ICD-10-CM | POA: Diagnosis not present

## 2022-07-22 ENCOUNTER — Encounter: Payer: Self-pay | Admitting: Internal Medicine

## 2022-07-22 NOTE — Progress Notes (Signed)
Location:  [facility] Service:  SNF [phone])  Provider:   Code Status: DNR Johny Shock Goals of Care:     06/29/2022    9:29 AM  Advanced Directives  Does Patient Have a Medical Advance Directive? Yes  Type of Estate agent of [place];Living will;Out of facility DNR (pink MOST or yellow form)  Does patient want to make changes to medical advance directive? No - Patient declined  Copy of Healthcare Power of Attorney in Chart? Yes - validated most recent copy scanned in chart (See row information)     Chief Complaint  Patient presents with   Chronic Care Management    HPI: Patient is a 72 y.o. female seen today for medical management of chronic diseases.    Lives in SNF Enrolled in Hospice   Patient was diagnosed with frontotemporal dementia in 2019.  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 She is retired Engineer, civil (consulting) Her sister is the POA  She is stable No Nursing Issues Michiel Sites dependent now Needs helps with Feeding Mostly aphasic Wt Readings from Last 3 Encounters:  07/20/22 151 lb (68.5 kg)  06/29/22 151 lb 12.8 oz (68.9 kg)  06/14/22 148 lb 1.6 oz (67.2 kg)    Weight is stable Does have Behavior issues but they are controlled. Unable to taper Seroquel   Past Medical History:  Diagnosis Date   Dementia (HCC)    Dementia in other diseases classified elsewhere, unspecified severity, with other behavioral disturbance (HCC)    Diabetes mellitus without complication (HCC)    Essential (primary) hypertension    per matrix   Hx of BKA, left (HCC) 10/24/2020   Hypo-osmolality and hyponatremia    Hypothyroidism, unspecified    Other frontotemporal neurocognitive disorder (CODE) (HCC)    Pure hypercholesterolemia, unspecified    Thyroid disease     Unspecified dementia, unspecified severity, without behavioral disturbance, psychotic disturbance, mood disturbance, and anxiety (HCC)    per matrix    No past surgical history on file.  No Known Allergies  Outpatient Encounter Medications as of 07/20/2022  Medication Sig   acetaminophen (TYLENOL) 500 MG tablet Take 1,000 mg by mouth 2 (two) times daily as needed for fever (pain).   acetaminophen (TYLENOL) 500 MG tablet Take 500 mg by mouth 2 (two) times daily.   Amino Acids-Protein Hydrolys (PRO-STAT PO) Take 30 mLs by mouth daily.   calcium carbonate (OS-CAL) 600 MG TABS tablet Take 600 mg by mouth daily with breakfast.   Camphor-Menthol-Methyl Sal (SALONPAS) 3.02-19-08 % PTCH Place 1 patch onto the skin See admin instructions. Apply one patch transdermally to left aka stump daily as needed for pain   citalopram (CELEXA) 10 MG tablet Take 10 mg by mouth every morning.   Cranberry 450 MG TABS Take by mouth daily.   divalproex (DEPAKOTE SPRINKLE) 125 MG capsule Take 250 mg by mouth 3 (three) times daily.   Docusate Sodium 100 MG capsule Take 100 mg by mouth 2 (two) times daily.   guaiFENesin (ROBITUSSIN) 100 MG/5ML liquid Take 200 mg by mouth every 4 (four) hours as needed for cough or congestion.   hydrocortisone cream 1 % Apply 1 Application topically as needed (Apply to forehead/neck/jawlines).   KETOCONAZOLE, TOPICAL, 1 % SHAM Apply 1 Application topically 2 (two) times a week. Monday  and Thursday.   levothyroxine (SYNTHROID) 50 MCG tablet Take 50 mcg by mouth daily before breakfast.   LORazepam (ATIVAN) 0.5 MG tablet Take 1 tablet (0.5 mg total) by mouth every 12 (twelve) hours as needed for anxiety.   LORazepam (ATIVAN) 2 MG tablet Take 2 mg by mouth as needed. Take 1 tablet as directed by [facility] staff   melatonin 3 MG TABS tablet Take 3 mg by mouth daily.   Multiple Vitamins-Minerals (HEALTHY EYES/LUTEIN-ZEAXANTHIN) CAPS Take 1 capsule by mouth every morning. DO NOT CRUSH    NYAMYC powder Apply 1 application  topically as needed (groin rash).   polyethylene glycol (MIRALAX / GLYCOLAX) 17 g packet Take 17 g by mouth as needed for mild constipation.   QUEtiapine (SEROQUEL) 25 MG tablet Take 25 mg by mouth every morning.   QUEtiapine (SEROQUEL) 50 MG tablet Take 50 mg by mouth at bedtime.   sodium chloride 1 g tablet Take 1 g by mouth once.   Sodium Fluoride (PREVIDENT 5000 BOOSTER PLUS) 1.1 % PSTE Place onto teeth daily. Brush on teeth with a toothbrush after PM mouth care. Spit out excess and do not rinse   triamcinolone cream (KENALOG) 0.5 % Apply 1 Application topically every 12 (twelve) hours as needed.   vitamin B-12 (CYANOCOBALAMIN) 500 MCG tablet Take 500 mcg by mouth daily.   zinc oxide 20 % ointment Apply 1 application  topically as needed (apply to buttocks for redness).   No facility-administered encounter medications on file as of 07/20/2022.    Review of Systems:  Review of Systems  Unable to perform ROS: Dementia    Health Maintenance  Topic Date Due   Pneumonia Vaccine 102+ Years old (3 of 3 - PPSV23 or PCV20) 10/13/2022 (Originally 12/09/2021)   COVID-19 Vaccine (7 - 2023-24 season) 10/13/2022 (Originally 03/10/2022)   Zoster Vaccines- Shingrix (1 of 2) 10/13/2022 (Originally 11/26/1969)   INFLUENZA VACCINE  09/14/2022   HEMOGLOBIN A1C  09/19/2022   FOOT EXAM  03/18/2023   OPHTHALMOLOGY EXAM  05/29/2023   Hepatitis C Screening  Completed   HPV VACCINES  Aged Out   DTaP/Tdap/Td  Discontinued   MAMMOGRAM  Discontinued   DEXA SCAN  Discontinued   Colonoscopy  Discontinued    Physical Exam: Vitals:   07/20/22 1754  BP: 122/71  Pulse: 88  Resp: 18  Temp: 97.7 F (36.5 C)  Weight: 151 lb (68.5 kg)   Body mass index is 25.13 kg/m. Physical Exam Vitals reviewed.  Constitutional:      Appearance: Normal appearance.  HENT:     Head: Normocephalic.     Nose: Nose normal.     Mouth/Throat:     Mouth: Mucous membranes are moist.      Pharynx: Oropharynx is clear.  Eyes:     Pupils: Pupils are equal, round, and reactive to light.  Cardiovascular:     Rate and Rhythm: Normal rate and regular rhythm.     Pulses: Normal pulses.     Heart sounds: Normal heart sounds. No murmur heard. Pulmonary:     Effort: Pulmonary effort is normal.     Breath sounds: Normal breath sounds.  Abdominal:     General: Abdomen is flat. Bowel sounds are normal.     Palpations: Abdomen is soft.  Musculoskeletal:        General: No swelling.     Cervical back: Neck supple.     Comments: S/p Left AKA  Skin:    General: Skin  is warm.  Neurological:     Mental Status: She is alert.  Psychiatric:        Mood and Affect: Mood normal.        Thought Content: Thought content normal.     Labs reviewed: Basic Metabolic Panel: Recent Labs    08/01/21 0000 09/19/21 0000 12/26/21 0000 03/21/22 0000  NA 142 139  --  139  K 4.4 4.2  --  4.3  CL 108 106  --  103  CO2 25* 23*  --  28*  BUN 25* 27*  --  29*  CREATININE 0.6 0.7  --  0.6  CALCIUM 9.3 9.2  --  9.6  TSH  --   --  3.19  --    Liver Function Tests: Recent Labs    03/21/22 0000  AST 11*  ALT 7  ALKPHOS 42  ALBUMIN 3.9   No results for input(s): "LIPASE", "AMYLASE" in the last 8760 hours. No results for input(s): "AMMONIA" in the last 8760 hours. CBC: Recent Labs    08/01/21 0000 09/19/21 0000 03/21/22 0000  WBC 7.7 6.6 9.2  NEUTROABS 4,797.00  --  6,182.00  HGB 13.6 13.8 15.5  HCT 41 41 45  PLT 214 173 201   Lipid Panel: Recent Labs    12/26/21 0000  CHOL 188  HDL 60  LDLCALC 106  TRIG 295   Lab Results  Component Value Date   HGBA1C 7.3 03/21/2022    Procedures since last visit: No results found.  Assessment/Plan 1. Frontotemporal dementia with behavioral disturbance (HCC) Total Care Enrolled in hospice Seroquel and Depakote for Behaviors 2. Type 2 diabetes mellitus with peripheral vascular disease (HCC) A1C 7.3  3. S/P AKA (above knee  amputation), left (HCC)   4. Acquired hypothyroidism TSH normal in 11/23  5. Essential hypertension Off All meds  6. Hyponatremia On Sodium tabs  7. Depression, recurrent (HCC) Celexa    Labs/tests ordered:  * No order type specified * Next appt:  Visit date not found

## 2022-08-08 ENCOUNTER — Non-Acute Institutional Stay (SKILLED_NURSING_FACILITY): Payer: Medicare Other | Admitting: Nurse Practitioner

## 2022-08-08 ENCOUNTER — Encounter: Payer: Self-pay | Admitting: Nurse Practitioner

## 2022-08-08 DIAGNOSIS — F339 Major depressive disorder, recurrent, unspecified: Secondary | ICD-10-CM

## 2022-08-08 DIAGNOSIS — L219 Seborrheic dermatitis, unspecified: Secondary | ICD-10-CM

## 2022-08-08 DIAGNOSIS — E871 Hypo-osmolality and hyponatremia: Secondary | ICD-10-CM | POA: Diagnosis not present

## 2022-08-08 DIAGNOSIS — I10 Essential (primary) hypertension: Secondary | ICD-10-CM | POA: Diagnosis not present

## 2022-08-08 DIAGNOSIS — E1151 Type 2 diabetes mellitus with diabetic peripheral angiopathy without gangrene: Secondary | ICD-10-CM

## 2022-08-08 DIAGNOSIS — F02818 Dementia in other diseases classified elsewhere, unspecified severity, with other behavioral disturbance: Secondary | ICD-10-CM | POA: Diagnosis not present

## 2022-08-08 DIAGNOSIS — G3109 Other frontotemporal dementia: Secondary | ICD-10-CM

## 2022-08-08 NOTE — Assessment & Plan Note (Signed)
Na tabs, Na 139 03/21/22, psychogenic drinking

## 2022-08-08 NOTE — Assessment & Plan Note (Signed)
Blood pressure is controlled, off meds.  

## 2022-08-08 NOTE — Progress Notes (Signed)
Location:   S[facility] Room Number: NO/29/A Place of Service:  SNF (31) Provider: Chipper Oman NP  Patient Care Team: Mahlon Gammon, MD as PCP - General (Internal Medicine)  Extended Emergency Contact Information Primary Emergency Contact: [facility] Phone: [phone] Relation: Sister Secondary Emergency Contact: [facility] Phone: [phone] Mobile Phone: [phone] Relation: Brother  Code Status:  DNR Goals of care: Advanced Directive information    06/29/2022    9:29 AM  Advanced Directives  Does Patient Have a Medical Advance Directive? Yes  Type of Estate agent of [place];Living will;Out of facility DNR (pink MOST or yellow form)  Does patient want to make changes to medical advance directive? No - Patient declined  Copy of Healthcare Power of Attorney in Chart? Yes - validated most recent copy scanned in chart (See row information)     Chief Complaint  Patient presents with   Acute Visit    Patient is being seen for facial rash Patient blood sugar 158 mg/dL    HPI:  Pt is a 72 y.o. female seen today for an acute visit for facial rash forehead, side of face around her ears, scaly, flaky patches, onset and duration uncertain.    Frontotemporal dementia with behavioral disturbance, on Seroquel,  Depakote, ASA, Statin, Lorazepam for Loud fake sneezing.              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              HTN, off meds.  Past Medical History:  Diagnosis Date   Dementia  (HCC)    Dementia in other diseases classified elsewhere, unspecified severity, with other behavioral disturbance (HCC)    Diabetes mellitus without complication (HCC)    Essential (primary) hypertension    per matrix   Hx of BKA, left (HCC) 10/24/2020   Hypo-osmolality and hyponatremia    Hypothyroidism, unspecified    Other frontotemporal neurocognitive disorder (CODE) (HCC)    Pure hypercholesterolemia, unspecified    Thyroid disease    Unspecified dementia, unspecified severity, without behavioral disturbance, psychotic disturbance, mood disturbance, and anxiety (HCC)    per matrix   History reviewed. No pertinent surgical history.  No Known Allergies  Allergies as of 08/08/2022   No Known Allergies      Medication List        Accurate as of August 08, 2022  2:56 PM. If you have any questions, ask your nurse or doctor.          acetaminophen 500 MG tablet Commonly known as: TYLENOL Take 1,000 mg by mouth 2 (two) times daily as needed for fever (pain).   acetaminophen 500 MG tablet Commonly known as: TYLENOL Take 500 mg by mouth 2 (two) times daily.  calcium carbonate 600 MG Tabs tablet Commonly known as: OS-CAL Take 600 mg by mouth daily with breakfast.   citalopram 10 MG tablet Commonly known as: CELEXA Take 10 mg by mouth every morning.   Cranberry 450 MG Tabs Take by mouth daily.   divalproex 125 MG capsule Commonly known as: DEPAKOTE SPRINKLE Take 250 mg by mouth 3 (three) times daily.   Docusate Sodium 100 MG capsule Take 100 mg by mouth 2 (two) times daily.   guaiFENesin 100 MG/5ML liquid Commonly known as: ROBITUSSIN Take 200 mg by mouth every 4 (four) hours as needed for cough or congestion.   Healthy Eyes/Lutein-Zeaxanthin Caps Take 1 capsule by mouth every morning. DO NOT CRUSH   hydrocortisone cream 1 % Apply 1 Application topically as needed (Apply to forehead/neck/jawlines).   KETOCONAZOLE (TOPICAL) 1 % Sham Apply 1 Application  topically 2 (two) times a week. Monday and Thursday.   levothyroxine 50 MCG tablet Commonly known as: SYNTHROID Take 50 mcg by mouth daily before breakfast.   LORazepam 2 MG tablet Commonly known as: ATIVAN Take 2 mg by mouth as needed. Take 1 tablet as directed by [facility] staff   LORazepam 0.5 MG tablet Commonly known as: ATIVAN Take 1 tablet (0.5 mg total) by mouth every 12 (twelve) hours as needed for anxiety.   melatonin 3 MG Tabs tablet Take 3 mg by mouth daily.   Nyamyc powder Generic drug: nystatin Apply 1 application  topically as needed (groin rash).   polyethylene glycol 17 g packet Commonly known as: MIRALAX / GLYCOLAX Take 17 g by mouth as needed for mild constipation.   PreviDent 5000 Booster Plus 1.1 % Pste Generic drug: Sodium Fluoride Place onto teeth daily. Brush on teeth with a toothbrush after PM mouth care. Spit out excess and do not rinse   PRO-STAT PO Take 30 mLs by mouth daily.   QUEtiapine 50 MG tablet Commonly known as: SEROQUEL Take 50 mg by mouth at bedtime.   QUEtiapine 25 MG tablet Commonly known as: SEROQUEL Take 25 mg by mouth every morning.   Salonpas 3.02-19-08 % Ptch Generic drug: Camphor-Menthol-Methyl Sal Place 1 patch onto the skin See admin instructions. Apply one patch transdermally to left aka stump daily as needed for pain   sodium chloride 1 g tablet Take 1 g by mouth once.   triamcinolone cream 0.5 % Commonly known as: KENALOG Apply 1 Application topically every 12 (twelve) hours as needed.   vitamin B-12 500 MCG tablet Commonly known as: CYANOCOBALAMIN Take 500 mcg by mouth daily.   zinc oxide 20 % ointment Apply 1 application  topically as needed (apply to buttocks for redness).        Review of Systems  Unable to perform ROS: Dementia    Immunization History  Administered Date(s) Administered   Fluad Quad(high Dose 65+) 12/07/2021   Influenza-Unspecified 11/11/2019, 11/11/2020   Janssen (J&J)  SARS-COV-2 Vaccination 01/13/2022   Moderna SARS-COV2 Booster Vaccination 02/02/2021   Moderna Sars-Covid-2 Vaccination 03/06/2019, 04/03/2019, 12/18/2019, 11/11/2020   PPD Test 05/05/2019   Pfizer Covid-19 Vaccine Bivalent Booster 5yrs & up 07/27/2021   Pneumococcal Conjugate-13 12/09/2016   Pneumococcal Polysaccharide-23 12/09/2013   Tdap 03/12/2012   Zoster, Live 11/26/2012   Zoster, Unspecified 11/26/2012   Pertinent  Health Maintenance Due  Topic Date Due   INFLUENZA VACCINE  09/14/2022   HEMOGLOBIN A1C  09/19/2022   FOOT EXAM  03/18/2023   OPHTHALMOLOGY EXAM  05/29/2023   MAMMOGRAM  Discontinued  DEXA SCAN  Discontinued   Colonoscopy  Discontinued      10/26/2020   12:00 PM 03/04/2021    2:48 PM 01/19/2022   11:42 AM 02/17/2022    1:22 PM 05/22/2022    1:02 PM  Fall Risk  Falls in the past year?  1 0 0 0  Was there an injury with Fall?  0 0 0 0  Fall Risk Category Calculator  1 0 0 0  Fall Risk Category (Retired)  Low Low Low   (RETIRED) Patient Fall Risk Level High fall risk Moderate fall risk Low fall risk Low fall risk   Patient at Risk for Falls Due to  History of fall(s);Impaired balance/gait;Impaired mobility No Fall Risks No Fall Risks History of fall(s);Impaired balance/gait;Impaired mobility;Mental status change  Fall risk Follow up  Falls evaluation completed;Education provided;Falls prevention discussed Falls evaluation completed Falls evaluation completed Falls evaluation completed;Education provided;Falls prevention discussed   Functional Status Survey:    Vitals:   08/08/22 1036  BP: 136/78  Pulse: 74  Resp: 18  Temp: 97.9 F (36.6 C)  SpO2: 96%  Weight: 151 lb 1.6 oz (68.5 kg)  Height: 5\' 5"  (1.651 m)   Body mass index is 25.14 kg/m. Physical Exam Vitals and nursing note reviewed.  Constitutional:      Appearance: Normal appearance.     Comments: Gradual weight loss noted, observe.   HENT:     Head: Normocephalic and atraumatic.     Nose:  Nose normal.     Mouth/Throat:     Mouth: Mucous membranes are moist.  Eyes:     Extraocular Movements: Extraocular movements intact.     Conjunctiva/sclera: Conjunctivae normal.     Pupils: Pupils are equal, round, and reactive to light.  Cardiovascular:     Rate and Rhythm: Normal rate and regular rhythm.     Heart sounds: No murmur heard. Pulmonary:     Effort: Pulmonary effort is normal.  Abdominal:     General: Bowel sounds are normal.     Palpations: Abdomen is soft.     Tenderness: There is no guarding.  Musculoskeletal:     Cervical back: Normal range of motion and neck supple.     Right lower leg: No edema.     Comments: Left AKA  Skin:    General: Skin is warm and dry.     Findings: Erythema and rash present.     Comments:  facial rash forehead, side of face around her ears, scaly, flaky patches, onset and duration uncertain.    Neurological:     General: No focal deficit present.     Mental Status: She is alert. Mental status is at baseline.     Deep Tendon Reflexes: Reflexes abnormal.     Comments: Oriented to self?  Psychiatric:     Comments: Nonverbal, didn't follow simple directions during my examination today.      Labs reviewed: Recent Labs    09/19/21 0000 03/21/22 0000  NA 139 139  K 4.2 4.3  CL 106 103  CO2 23* 28*  BUN 27* 29*  CREATININE 0.7 0.6  CALCIUM 9.2 9.6   Recent Labs    03/21/22 0000  AST 11*  ALT 7  ALKPHOS 42  ALBUMIN 3.9   Recent Labs    09/19/21 0000 03/21/22 0000  WBC 6.6 9.2  NEUTROABS  --  6,182.00  HGB 13.8 15.5  HCT 41 45  PLT 173 201   Lab Results  Component  Value Date   TSH 3.19 12/26/2021   Lab Results  Component Value Date   HGBA1C 7.3 03/21/2022   Lab Results  Component Value Date   CHOL 188 12/26/2021   HDL 60 12/26/2021   LDLCALC 106 12/26/2021   TRIG 119 12/26/2021    Significant Diagnostic Results in last 30 days:  No results found.  Assessment/Plan Seborrheic dermatitis 2%  Ketoconazole, 1% Hydrocortisone cream bid to facial rash away from eyes bid x 2 weeks.   Type 2 diabetes mellitus with peripheral vascular disease (HCC) Hgb 7.3 03/21/22  Hyponatremia  Na tabs, Na 139 03/21/22, psychogenic drinking   Depression, recurrent (HCC)  takes Citalopram, Lorazepam, flat facial looks.   Essential hypertension Blood pressure is controlled, off meds.   Frontotemporal dementia with behavioral disturbance (HCC) Frontotemporal dementia with behavioral disturbance, on Seroquel,  Depakote, ASA, Statin, Lorazepam for Loud fake sneezing.              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   Labs/tests ordered:  none  Time spend 25 minutes.

## 2022-08-08 NOTE — Assessment & Plan Note (Signed)
Frontotemporal dementia with behavioral disturbance, on Seroquel,  Depakote, ASA, Statin, Lorazepam for Loud fake sneezing.              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

## 2022-08-08 NOTE — Assessment & Plan Note (Signed)
takes Citalopram, Lorazepam, flat facial looks.

## 2022-08-08 NOTE — Assessment & Plan Note (Signed)
Hgb 7.3 03/21/22

## 2022-08-08 NOTE — Assessment & Plan Note (Signed)
2% Ketoconazole, 1% Hydrocortisone cream bid to facial rash away from eyes bid x 2 weeks.

## 2022-08-23 ENCOUNTER — Non-Acute Institutional Stay (SKILLED_NURSING_FACILITY): Payer: Medicare Other | Admitting: Orthopedic Surgery

## 2022-08-23 ENCOUNTER — Encounter: Payer: Self-pay | Admitting: Orthopedic Surgery

## 2022-08-23 DIAGNOSIS — F02818 Dementia in other diseases classified elsewhere, unspecified severity, with other behavioral disturbance: Secondary | ICD-10-CM

## 2022-08-23 DIAGNOSIS — G3109 Other frontotemporal dementia: Secondary | ICD-10-CM

## 2022-08-23 DIAGNOSIS — E039 Hypothyroidism, unspecified: Secondary | ICD-10-CM

## 2022-08-23 DIAGNOSIS — E1151 Type 2 diabetes mellitus with diabetic peripheral angiopathy without gangrene: Secondary | ICD-10-CM

## 2022-08-23 DIAGNOSIS — I10 Essential (primary) hypertension: Secondary | ICD-10-CM

## 2022-08-23 DIAGNOSIS — E871 Hypo-osmolality and hyponatremia: Secondary | ICD-10-CM

## 2022-08-23 DIAGNOSIS — Z89512 Acquired absence of left leg below knee: Secondary | ICD-10-CM | POA: Diagnosis not present

## 2022-08-23 DIAGNOSIS — F339 Major depressive disorder, recurrent, unspecified: Secondary | ICD-10-CM

## 2022-08-23 NOTE — Progress Notes (Signed)
Location:   [facility]  Nursing Home Room Number: 29-A Place of Service:  SNF [phone]) Provider:  Hazle Nordmann, NP  PCP: Mahlon Gammon, MD  Patient Care Team: Mahlon Gammon, MD as PCP - General (Internal Medicine)  Extended Emergency Contact Information Primary Emergency Contact: [facility] Phone: [phone] Relation: Sister Secondary Emergency Contact: [facility] Phone: [phone] Mobile Phone: [phone] Relation: Brother  Code Status:  DNR Goals of care: Advanced Directive information    08/23/2022   10:30 AM  Advanced Directives  Does Patient Have a Medical Advance Directive? Yes  Type of Estate agent of [place];Living will;Out of facility DNR (pink MOST or yellow form)  Does patient want to make changes to medical advance directive? No - Patient declined  Copy of Healthcare Power of Attorney in Chart? Yes - validated most recent copy scanned in chart (See row information)     Chief Complaint  Patient presents with   Medical Management of Chronic Issues    Routine Visit.     HPI:  Pt is a 72 y.o. female seen today for medical management of chronic diseases.    She currently resides on the skilled nursing unit at [facility] due to frontotemporal dementia. PMH: HTN, hypothyroidism, T2DM, alcohol abuse, psychosis, wernicke-korsakoff syndrome, thrombocytopenia.     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   Scalp and face improved since starting hydrocortisone/ketoconazole creams/shampoo.   Recent weights:  07/01- 148.6 lbs  06/01- 151.1 lbs  05/01- 151.8 lbs  Recent blood pressures;  07/09- 126/83  07/02- 121/77  06/25- 125/75       Allergies as of 08/23/2022   No Known Allergies      Medication List        Accurate as of August 23, 2022 10:32 AM. If you have any questions, ask your nurse or doctor.          acetaminophen 500 MG tablet Commonly known as: TYLENOL Take 1,000 mg by mouth 2 (two) times daily as needed for fever (pain).   acetaminophen 500 MG tablet Commonly known as: TYLENOL Take 500 mg by mouth 2 (two) times daily.   calcium carbonate 600 MG Tabs tablet Commonly known as: OS-CAL Take 600 mg by mouth daily with breakfast.   citalopram 10 MG tablet Commonly known as: CELEXA Take 10 mg by mouth every morning.   Cranberry 450 MG Tabs Take by mouth daily.   divalproex 125 MG capsule Commonly known as: DEPAKOTE SPRINKLE Take 250 mg by mouth 3 (three) times daily.   Docusate Sodium 100 MG capsule Take 100 mg by mouth 2 (two) times daily.   guaiFENesin 100 MG/5ML liquid Commonly known as: ROBITUSSIN Take 200 mg by mouth every 4 (four) hours as needed for cough or congestion.   Healthy Eyes/Lutein-Zeaxanthin Caps Take 1 capsule by mouth every morning. DO NOT CRUSH  hydrocortisone cream 1 % Apply 1 Application topically as needed (Apply to forehead/neck/jawlines).   Aquanil HC 1 % lotion Generic drug: hydrocortisone Apply 1 Application topically 2 (two) times daily. Forehead/Neck/Jawline for lesions and dryness.   KETOCONAZOLE (TOPICAL) 1 % Sham Apply 1 Application topically 2 (two) times a week. Monday and  Thursday.   levothyroxine 50 MCG tablet Commonly known as: SYNTHROID Take 50 mcg by mouth daily before breakfast.   LORazepam 2 MG tablet Commonly known as: ATIVAN Take 2 mg by mouth as needed. Take 1 tablet as directed by [facility] staff What changed: Another medication with the same name was removed. Continue taking this medication, and follow the directions you see here. Changed by: Octavia Heir, NP   melatonin 3 MG Tabs tablet Take 3 mg by mouth daily.   nystatin powder Commonly known as: MYCOSTATIN/NYSTOP Apply 1 Application topically daily.   Nyamyc powder Generic drug: nystatin Apply 1 application  topically as needed (groin rash).   polyethylene glycol 17 g packet Commonly known as: MIRALAX / GLYCOLAX Take 17 g by mouth as needed for mild constipation.   PreviDent 5000 Booster Plus 1.1 % Pste Generic drug: Sodium Fluoride Place onto teeth daily. Brush on teeth with a toothbrush after PM mouth care. Spit out excess and do not rinse   PRO-STAT PO Take 30 mLs by mouth daily.   QUEtiapine 50 MG tablet Commonly known as: SEROQUEL Take 50 mg by mouth at bedtime.   QUEtiapine 25 MG tablet Commonly known as: SEROQUEL Take 25 mg by mouth every morning.   Salonpas 3.02-19-08 % Ptch Generic drug: Camphor-Menthol-Methyl Sal Place 1 patch onto the skin See admin instructions. Apply one patch transdermally to left aka stump daily as needed for pain   sodium chloride 1 g tablet Take 1 g by mouth once.   triamcinolone cream 0.5 % Commonly known as: KENALOG Apply 1 Application topically every 12 (twelve) hours as needed.   vitamin B-12 500 MCG tablet Commonly known as: CYANOCOBALAMIN Take 500 mcg by mouth daily.   zinc oxide 20 % ointment Apply 1 application  topically as needed (apply to buttocks for redness).        Review of Systems  Unable to perform ROS: Dementia    Immunization History  Administered Date(s) Administered   Fluad Quad(high Dose  65+) 12/07/2021   Influenza-Unspecified 11/11/2019, 11/11/2020   Janssen (J&J) SARS-COV-2 Vaccination 01/13/2022   Moderna SARS-COV2 Booster Vaccination 02/02/2021   Moderna Sars-Covid-2 Vaccination 03/06/2019, 04/03/2019, 12/18/2019, 11/11/2020   PPD Test 05/05/2019   Pfizer Covid-19 Vaccine Bivalent Booster 52yrs & up 07/27/2021   Pneumococcal Conjugate-13 12/09/2016   Pneumococcal Polysaccharide-23 12/09/2013   Tdap 03/12/2012   Zoster, Live 11/26/2012   Zoster, Unspecified 11/26/2012   Pertinent  Health Maintenance Due  Topic Date Due   INFLUENZA VACCINE  09/14/2022   HEMOGLOBIN A1C  09/19/2022   FOOT EXAM  03/18/2023   OPHTHALMOLOGY EXAM  05/29/2023   MAMMOGRAM  Discontinued   DEXA SCAN  Discontinued   Colonoscopy  Discontinued      10/26/2020   12:00 PM 03/04/2021    2:48 PM 01/19/2022   11:42 AM 02/17/2022    1:22 PM 05/22/2022    1:02 PM  Fall Risk  Falls in the past year?  1 0 0 0  Was there an injury with Fall?  0 0 0 0  Fall Risk Category Calculator  1 0 0 0  Fall Risk Category (Retired)  Low  Low Low   (RETIRED) Patient Fall Risk Level High fall risk Moderate fall risk Low fall risk Low fall risk   Patient at Risk for Falls Due to  History of fall(s);Impaired balance/gait;Impaired mobility No Fall Risks No Fall Risks History of fall(s);Impaired balance/gait;Impaired mobility;Mental status change  Fall risk Follow up  Falls evaluation completed;Education provided;Falls prevention discussed Falls evaluation completed Falls evaluation completed Falls evaluation completed;Education provided;Falls prevention discussed   Functional Status Survey:    Vitals:   08/23/22 1019  BP: 126/83  Pulse: 74  Resp: 16  Temp: (!) 97.4 F (36.3 C)  SpO2: 98%  Weight: 148 lb 9.6 oz (67.4 kg)  Height: 5\' 5"  (1.651 m)   Body mass index is 24.73 kg/m. Physical Exam Vitals reviewed.  Constitutional:      General: She is not in acute distress. HENT:     Head: Normocephalic.      Right Ear: There is no impacted cerumen.     Left Ear: There is no impacted cerumen.     Nose: Nose normal.     Mouth/Throat:     Mouth: Mucous membranes are moist.  Eyes:     General:        Right eye: No discharge.        Left eye: No discharge.  Cardiovascular:     Rate and Rhythm: Normal rate and regular rhythm.     Pulses: Normal pulses.     Heart sounds: Normal heart sounds.  Pulmonary:     Effort: Pulmonary effort is normal.     Breath sounds: Normal breath sounds.  Abdominal:     General: Bowel sounds are normal.     Palpations: Abdomen is soft.  Musculoskeletal:     Cervical back: Neck supple.     Right lower leg: No edema.     Comments: Left BKA  Skin:    General: Skin is warm.     Capillary Refill: Capillary refill takes less than 2 seconds.  Neurological:     General: No focal deficit present.     Mental Status: She is alert.     Motor: Weakness present.     Gait: Gait abnormal.     Comments: hoyer  Psychiatric:        Mood and Affect: Mood normal.     Comments: Nonverbal, does not follow commands, alert to self     Labs reviewed: Recent Labs    09/19/21 0000 03/21/22 0000  NA 139 139  K 4.2 4.3  CL 106 103  CO2 23* 28*  BUN 27* 29*  CREATININE 0.7 0.6  CALCIUM 9.2 9.6   Recent Labs    03/21/22 0000  AST 11*  ALT 7  ALKPHOS 42  ALBUMIN 3.9   Recent Labs    09/19/21 0000 03/21/22 0000  WBC 6.6 9.2  NEUTROABS  --  6,182.00  HGB 13.8 15.5  HCT 41 45  PLT 173 201   Lab Results  Component Value Date   TSH 3.19 12/26/2021   Lab Results  Component Value Date   HGBA1C 7.3 03/21/2022   Lab Results  Component Value Date   CHOL 188 12/26/2021   HDL 60 12/26/2021   LDLCALC 106 12/26/2021   TRIG 119 12/26/2021    Significant Diagnostic Results in last 30 days:  No results found.  Assessment/Plan 1. Type 2 diabetes mellitus with peripheral vascular disease (HCC) - A1c stable - diet controlled - no hypoglycemias - eye exam  05/2022  2. Frontotemporal dementia with behavioral disturbance (HCC) - followed by hospice - weight down 12 lbs from last year - dependent with all ADLs including feeding - cont Depakote and Seroquel  3. Hx of BKA, left (HCC) - cont skilled nursing  4. Essential hypertension - controlled without medication  5. Acquired hypothyroidism - TSH stable - cont levothyroxine  6. Hyponatremia - cont sodium tablets   7. Depression, recurrent (HCC) - no mood changes - cont citalopram    Family/ staff Communication: plan discussed with patient and nurse  Labs/tests ordered: none

## 2022-09-25 ENCOUNTER — Non-Acute Institutional Stay (SKILLED_NURSING_FACILITY): Payer: Medicare Other | Admitting: Orthopedic Surgery

## 2022-09-25 ENCOUNTER — Encounter: Payer: Self-pay | Admitting: Orthopedic Surgery

## 2022-09-25 DIAGNOSIS — I10 Essential (primary) hypertension: Secondary | ICD-10-CM

## 2022-09-25 DIAGNOSIS — F02818 Dementia in other diseases classified elsewhere, unspecified severity, with other behavioral disturbance: Secondary | ICD-10-CM

## 2022-09-25 DIAGNOSIS — E1151 Type 2 diabetes mellitus with diabetic peripheral angiopathy without gangrene: Secondary | ICD-10-CM

## 2022-09-25 DIAGNOSIS — E039 Hypothyroidism, unspecified: Secondary | ICD-10-CM | POA: Diagnosis not present

## 2022-09-25 DIAGNOSIS — Z89512 Acquired absence of left leg below knee: Secondary | ICD-10-CM

## 2022-09-25 DIAGNOSIS — G3109 Other frontotemporal dementia: Secondary | ICD-10-CM | POA: Diagnosis not present

## 2022-09-25 DIAGNOSIS — E871 Hypo-osmolality and hyponatremia: Secondary | ICD-10-CM | POA: Diagnosis not present

## 2022-09-25 DIAGNOSIS — L219 Seborrheic dermatitis, unspecified: Secondary | ICD-10-CM | POA: Diagnosis not present

## 2022-09-25 DIAGNOSIS — F339 Major depressive disorder, recurrent, unspecified: Secondary | ICD-10-CM

## 2022-09-25 MED ORDER — LORAZEPAM 0.5 MG PO TABS
0.5000 mg | ORAL_TABLET | Freq: Two times a day (BID) | ORAL | Status: DC | PRN
Start: 2022-09-25 — End: 2022-11-24

## 2022-09-25 MED ORDER — HYDROCORTISONE 1 % EX CREA
1.0000 | TOPICAL_CREAM | Freq: Two times a day (BID) | CUTANEOUS | Status: DC | PRN
Start: 2022-09-25 — End: 2022-09-25

## 2022-09-25 NOTE — Addendum Note (Signed)
Addended by: Meda Klinefelter E on: 09/25/2022 12:31 PM   Modules accepted: Orders

## 2022-09-25 NOTE — Progress Notes (Signed)
Location:  [facility] Room Number: 29/A Place of Service:  SNF [phone]) Provider:  Octavia Heir, NP   Mahlon Gammon, MD  Patient Care Team: Mahlon Gammon, MD as PCP - General (Internal Medicine)  Extended Emergency Contact Information Primary Emergency Contact: [facility] Phone: [phone] Relation: Sister Secondary Emergency Contact: [facility] Phone: [phone] Mobile Phone: [phone] Relation: Brother  Code Status:  DNR Goals of care: Advanced Directive information    08/23/2022   10:30 AM  Advanced Directives  Does Patient Have a Medical Advance Directive? Yes  Type of Estate agent of [place];Living will;Out of facility DNR (pink MOST or yellow form)  Does patient want to make changes to medical advance directive? No - Patient declined  Copy of Healthcare Power of Attorney in Chart? Yes - validated most recent copy scanned in chart (See row information)     Chief Complaint  Patient presents with   Medical Management of Chronic Issues    HPI:  Pt is a 72 y.o. female seen today for medical management of chronic diseases.    She currently resides on the skilled nursing unit at [facility] due to frontotemporal dementia. PMH: HTN, hypothyroidism, T2DM, alcohol abuse, psychosis, wernicke-korsakoff syndrome, thrombocytopenia.      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   Recent weights:  08/01- 147.2 lbs  07/01- 148.6 lbs  06/01- 151.1 lbs  Recent blood pressures:  08/06- 119/70  07/23- 122/78  07/16- 123/78   08/01- 147.2 lbs  07/01- 148.6 lbs  06/01- 151.1 lbs  Recent blood pressures:  08/06- 119/70  07/23- 122/78  07/16- 123/78 Past Medical History:  Diagnosis Date   Dementia (HCC)    Dementia in other diseases classified elsewhere, unspecified severity, with other behavioral disturbance (HCC)    Diabetes mellitus without complication (HCC)    Essential (primary) hypertension    per matrix   Hx of BKA, left (HCC) 10/24/2020   Hypo-osmolality and hyponatremia    Hypothyroidism, unspecified    Other frontotemporal neurocognitive disorder (CODE) (HCC)    Pure hypercholesterolemia, unspecified    Thyroid disease    Unspecified dementia, unspecified severity, without behavioral disturbance, psychotic disturbance, mood disturbance, and anxiety (HCC)    per matrix   No past surgical history on file.  No Known Allergies  Outpatient Encounter Medications as of 09/25/2022  Medication Sig   acetaminophen (TYLENOL) 500 MG tablet Take 1,000 mg by mouth 2 (two) times daily as needed for fever (pain).   acetaminophen (TYLENOL) 500 MG tablet Take 500 mg by mouth 2 (two) times daily.   Amino Acids-Protein Hydrolys (PRO-STAT PO) Take 30 mLs by mouth daily.   calcium carbonate (OS-CAL) 600 MG TABS tablet Take 600 mg by mouth daily with breakfast.   Camphor-Menthol-Methyl Sal (SALONPAS) 3.02-19-08 % PTCH  Place 1 patch onto the skin See admin instructions. Apply one patch transdermally to left aka stump daily as needed for pain   citalopram (CELEXA) 10  MG tablet Take 10 mg by mouth every morning.   Cranberry 450 MG TABS Take by mouth daily.   divalproex (DEPAKOTE SPRINKLE) 125 MG capsule Take 250 mg by mouth 3 (three) times daily.   Docusate Sodium 100 MG capsule Take 100 mg by mouth 2 (two) times daily.   guaiFENesin (ROBITUSSIN) 100 MG/5ML liquid Take 200 mg by mouth every 4 (four) hours as needed for cough or congestion.   hydrocortisone (AQUANIL HC) 1 % lotion Apply 1 Application topically 2 (two) times daily. Forehead/Neck/Jawline for lesions and dryness.   hydrocortisone cream 1 % Apply 1 Application topically as needed (Apply to forehead/neck/jawlines).   KETOCONAZOLE, TOPICAL, 1 % SHAM Apply 1 Application topically 2 (two) times a week. Monday and Thursday.   levothyroxine (SYNTHROID) 50 MCG tablet Take 50 mcg by mouth daily before breakfast.   LORazepam (ATIVAN) 2 MG tablet Take 2 mg by mouth as needed. Take 1 tablet as directed by [facility] staff   melatonin 3 MG TABS tablet Take 3 mg by mouth daily.   Multiple Vitamins-Minerals (HEALTHY EYES/LUTEIN-ZEAXANTHIN) CAPS Take 1 capsule by mouth every morning. DO NOT CRUSH   NYAMYC powder Apply 1 application  topically as needed (groin rash).   nystatin (MYCOSTATIN/NYSTOP) powder Apply 1 Application topically daily.   polyethylene glycol (MIRALAX / GLYCOLAX) 17 g packet Take 17 g by mouth as needed for mild constipation.   QUEtiapine (SEROQUEL) 25 MG tablet Take 25 mg by mouth every morning.   QUEtiapine (SEROQUEL) 50 MG tablet Take 50 mg by mouth at bedtime.   sodium chloride 1 g tablet Take 1 g by mouth once.   Sodium Fluoride (PREVIDENT 5000 BOOSTER PLUS) 1.1 % PSTE Place onto teeth daily. Brush on teeth with a toothbrush after PM mouth care. Spit out excess and do not rinse   triamcinolone cream (KENALOG) 0.5 % Apply 1 Application topically every 12 (twelve) hours as needed.   vitamin B-12 (CYANOCOBALAMIN) 500 MCG tablet Take 500 mcg by mouth daily.   zinc oxide 20 % ointment  Apply 1 application  topically as needed (apply to buttocks for redness).   No facility-administered encounter medications on file as of 09/25/2022.    Review of Systems  Unable to perform ROS: Dementia    Immunization History  Administered Date(s) Administered   Fluad Quad(high Dose 65+) 12/07/2021   Influenza-Unspecified 11/11/2019, 11/11/2020   Janssen (J&J) SARS-COV-2 Vaccination 01/13/2022   Moderna SARS-COV2 Booster Vaccination 02/02/2021   Moderna Sars-Covid-2 Vaccination 03/06/2019, 04/03/2019, 12/18/2019, 11/11/2020   PPD Test 05/05/2019   Pfizer Covid-19 Vaccine Bivalent Booster 67yrs & up 07/27/2021   Pneumococcal Conjugate-13 12/09/2016   Pneumococcal Polysaccharide-23 12/09/2013   Tdap 03/12/2012   Zoster, Live 11/26/2012   Zoster, Unspecified 11/26/2012   Pertinent  Health Maintenance Due  Topic Date Due   INFLUENZA VACCINE  09/14/2022   HEMOGLOBIN A1C  09/19/2022   FOOT EXAM  03/18/2023   OPHTHALMOLOGY EXAM  05/29/2023   MAMMOGRAM  Discontinued   DEXA SCAN  Discontinued   Colonoscopy  Discontinued      10/26/2020   12:00 PM 03/04/2021    2:48 PM 01/19/2022   11:42 AM 02/17/2022    1:22 PM 05/22/2022    1:02 PM  Fall Risk  Falls in the past year?  1 0 0 0  Was  there an injury with Fall?  0 0 0 0  Fall Risk Category Calculator  1 0 0 0  Fall Risk Category (Retired)  Low Low Low   (RETIRED) Patient Fall Risk Level High fall risk Moderate fall risk Low fall risk Low fall risk   Patient at Risk for Falls Due to  History of fall(s);Impaired balance/gait;Impaired mobility No Fall Risks No Fall Risks History of fall(s);Impaired balance/gait;Impaired mobility;Mental status change  Fall risk Follow up  Falls evaluation completed;Education provided;Falls prevention discussed Falls evaluation completed Falls evaluation completed Falls evaluation completed;Education provided;Falls prevention discussed   Functional Status Survey:    Vitals:   09/25/22 1122  BP: 119/70   Pulse: 79  Resp: 18  Temp: (!) 97.3 F (36.3 C)  SpO2: 98%  Weight: 147 lb 3.2 oz (66.8 kg)  Height: 5\' 5"  (1.651 m)   Body mass index is 24.5 kg/m. Physical Exam Vitals reviewed.  Constitutional:      General: She is not in acute distress. HENT:     Head: Normocephalic.     Right Ear: There is no impacted cerumen.     Left Ear: There is no impacted cerumen.     Nose: Nose normal.     Mouth/Throat:     Mouth: Mucous membranes are moist.  Eyes:     General:        Right eye: No discharge.        Left eye: No discharge.  Cardiovascular:     Rate and Rhythm: Normal rate and regular rhythm.     Pulses: Normal pulses.     Heart sounds: Normal heart sounds.  Pulmonary:     Effort: Pulmonary effort is normal.     Breath sounds: Normal breath sounds.  Abdominal:     General: Bowel sounds are normal. There is no distension.     Palpations: Abdomen is soft.     Tenderness: There is no abdominal tenderness.  Musculoskeletal:     Cervical back: Neck supple.     Right lower leg: No edema.     Comments: Left BKA  Skin:    General: Skin is dry.     Capillary Refill: Capillary refill takes less than 2 seconds.     Comments: Small area of redness with dry skin above left brow-line, no skin breakdown. Scalp with areas of flaking, no skin breakdown.   Neurological:     General: No focal deficit present.     Mental Status: She is alert.  Psychiatric:        Mood and Affect: Mood normal.     Comments: Nonverbal, does not follow commands     Labs reviewed: Recent Labs    03/21/22 0000  NA 139  K 4.3  CL 103  CO2 28*  BUN 29*  CREATININE 0.6  CALCIUM 9.6   Recent Labs    03/21/22 0000  AST 11*  ALT 7  ALKPHOS 42  ALBUMIN 3.9   Recent Labs    03/21/22 0000  WBC 9.2  NEUTROABS 6,182.00  HGB 15.5  HCT 45  PLT 201   Lab Results  Component Value Date   TSH 3.19 12/26/2021   Lab Results  Component Value Date   HGBA1C 7.3 03/21/2022   Lab Results   Component Value Date   CHOL 188 12/26/2021   HDL 60 12/26/2021   LDLCALC 106 12/26/2021   TRIG 119 12/26/2021    Significant Diagnostic Results in last 30 days:  No  results found.  Assessment/Plan 1. Seborrheic dermatitis - improved - involves face and scalp - cont ketoconazole shampoo on shower days - cont hydrocortisone 1% cream BID prn   2. Type 2 diabetes mellitus with peripheral vascular disease (HCC) - no hypoglycemias - off metformin due to weight loss - eye exam 05/2022 - recheck A1c   3. Frontotemporal dementia with behavioral disturbance (HCC) - followed by hospice - nonverbal  - dependent with ADLs - hoyer transfer - no recent behaviors - weights trending down - cont Seroquel, Depakote and Ativan  4. Hx of BKA, left (HCC) - cont skilled nursing   5. Essential hypertension - controlled without medication  6. Acquired hypothyroidism - cont levothyroxine - TSH- future  7. Hyponatremia - cont sodium tablets  - bmp-future  8. Depression, recurrent (HCC) - no mood changes - cont Celexa    Family/ staff Communication: plan discussed with patient and nurse  Labs/tests ordered:  A1c, TSH, bmp 08/15

## 2022-09-28 DIAGNOSIS — E039 Hypothyroidism, unspecified: Secondary | ICD-10-CM | POA: Diagnosis not present

## 2022-09-28 DIAGNOSIS — E119 Type 2 diabetes mellitus without complications: Secondary | ICD-10-CM | POA: Diagnosis not present

## 2022-09-28 DIAGNOSIS — I10 Essential (primary) hypertension: Secondary | ICD-10-CM | POA: Diagnosis not present

## 2022-09-28 LAB — COMPREHENSIVE METABOLIC PANEL
Calcium: 8.8 (ref 8.7–10.7)
eGFR: 102

## 2022-09-28 LAB — BASIC METABOLIC PANEL
BUN: 30 — AB (ref 4–21)
CO2: 25 — AB (ref 13–22)
Chloride: 104 (ref 99–108)
Creatinine: 0.5 (ref 0.5–1.1)
Glucose: 147
Potassium: 4 meq/L (ref 3.5–5.1)
Sodium: 139 (ref 137–147)

## 2022-09-28 LAB — HEMOGLOBIN A1C: Hemoglobin A1C: 7.4

## 2022-11-03 ENCOUNTER — Non-Acute Institutional Stay (SKILLED_NURSING_FACILITY): Admitting: Orthopedic Surgery

## 2022-11-03 ENCOUNTER — Encounter: Payer: Self-pay | Admitting: Orthopedic Surgery

## 2022-11-03 DIAGNOSIS — F419 Anxiety disorder, unspecified: Secondary | ICD-10-CM

## 2022-11-03 DIAGNOSIS — K0889 Other specified disorders of teeth and supporting structures: Secondary | ICD-10-CM | POA: Diagnosis not present

## 2022-11-03 DIAGNOSIS — I700 Atherosclerosis of aorta: Secondary | ICD-10-CM | POA: Diagnosis not present

## 2022-11-03 DIAGNOSIS — G3109 Other frontotemporal dementia: Secondary | ICD-10-CM | POA: Diagnosis not present

## 2022-11-03 DIAGNOSIS — E039 Hypothyroidism, unspecified: Secondary | ICD-10-CM | POA: Diagnosis not present

## 2022-11-03 DIAGNOSIS — E1151 Type 2 diabetes mellitus with diabetic peripheral angiopathy without gangrene: Secondary | ICD-10-CM

## 2022-11-03 DIAGNOSIS — F02818 Dementia in other diseases classified elsewhere, unspecified severity, with other behavioral disturbance: Secondary | ICD-10-CM

## 2022-11-03 DIAGNOSIS — E871 Hypo-osmolality and hyponatremia: Secondary | ICD-10-CM | POA: Diagnosis not present

## 2022-11-03 DIAGNOSIS — I10 Essential (primary) hypertension: Secondary | ICD-10-CM

## 2022-11-03 DIAGNOSIS — Z89512 Acquired absence of left leg below knee: Secondary | ICD-10-CM

## 2022-11-03 DIAGNOSIS — F339 Major depressive disorder, recurrent, unspecified: Secondary | ICD-10-CM

## 2022-11-03 NOTE — Progress Notes (Unsigned)
Location:  [facility] Room Number: 29/A Place of Service:  SNF [phone]) Provider:  Octavia Heir, NP   Mahlon Gammon, MD  Patient Care Team: Mahlon Gammon, MD as PCP - General (Internal Medicine)  Extended Emergency Contact Information Primary Emergency Contact: [facility] Phone: [phone] Relation: Sister Secondary Emergency Contact: [facility] Phone: [phone] Mobile Phone: [phone] Relation: Brother  Code Status:  DNR Goals of care: Advanced Directive information    09/25/2022   12:31 PM  Advanced Directives  Does Patient Have a Medical Advance Directive? Yes  Type of Estate agent of [place];Living will;Out of facility DNR (pink MOST or yellow form)  Does patient want to make changes to medical advance directive? No - Patient declined  Copy of Healthcare Power of Attorney in Chart? Yes - validated most recent copy scanned in chart (See row information)     Chief Complaint  Patient presents with   Medical Management of Chronic Issues    HPI:  Pt is a 72 y.o. female seen today for medical management of chronic diseases.    She currently resides on the skilled nursing unit at [facility] due to frontotemporal dementia. PMH: HTN, hypothyroidism, T2DM, alcohol abuse, psychosis, wernicke-korsakoff syndrome, thrombocytopenia.      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, ambulates with wheelchair/hoyer HTN- BUN/creat 29/0.60 03/20/2022, not on  medication Hypothyroidism- TSH 3.19 12/26/2021, remains on levothyroxine Hyponatremia- Na 139 03/20/2022, remains on sodium tablets Depression and anxiety- remains on Celexa and ativan prn Dental pain- 09/20 tooth abstraction, remains on tylenol prn   Recent weights:  09/01- 145.5 lbs  08/01- 147.2 lbs             07/01- 148.6 lbs  Recent blood pressures:  09/17- 167/81  09/10- 118/55 09/03- 128/63   Past Medical History:  Diagnosis Date   Dementia (HCC)    Dementia in other diseases classified elsewhere, unspecified severity, with other behavioral disturbance (HCC)    Diabetes mellitus without complication (HCC)    Essential (primary) hypertension    per matrix   Hx of BKA, left (HCC) 10/24/2020   Hypo-osmolality and hyponatremia    Hypothyroidism, unspecified    Other frontotemporal neurocognitive disorder (CODE) (HCC)    Pure hypercholesterolemia, unspecified    Thyroid disease    Unspecified dementia, unspecified severity, without behavioral disturbance, psychotic disturbance, mood disturbance, and anxiety (HCC)    per matrix   No past surgical history on file.  No Known Allergies  Outpatient Encounter Medications as of 11/03/2022  Medication Sig   acetaminophen (TYLENOL) 500 MG tablet Take 1,000 mg by mouth 2 (two) times daily as needed for fever (pain).   acetaminophen (TYLENOL) 500 MG tablet Take 500 mg by mouth 2 (two) times daily.   Amino Acids-Protein Hydrolys (PRO-STAT PO) Take 30 mLs by mouth daily.   Camphor-Menthol-Methyl Sal (SALONPAS) 3.02-19-08 % PTCH Place 1 patch onto the skin See admin instructions. Apply one patch transdermally to left aka stump daily as needed for pain   citalopram (CELEXA) 10 MG tablet Take 10 mg by mouth every morning.   divalproex (DEPAKOTE SPRINKLE)  125 MG capsule Take 250 mg by mouth 3 (three) times daily.   Docusate Sodium 100 MG capsule Take 100 mg by mouth 2 (two) times daily.   guaiFENesin (ROBITUSSIN) 100 MG/5ML liquid Take 10  mLs by mouth every 4 (four) hours as needed for cough or congestion.   hydrocortisone (AQUANIL HC) 1 % lotion Apply 1 Application topically 2 (two) times daily. Forehead/Neck/Jawline for lesions and dryness.   KETOCONAZOLE, TOPICAL, 1 % SHAM Apply 1 Application topically once a week. Thursday.   levothyroxine (SYNTHROID) 50 MCG tablet Take 50 mcg by mouth daily before breakfast.   LORazepam (ATIVAN) 0.5 MG tablet Take 1 tablet (0.5 mg total) by mouth 2 (two) times daily as needed for anxiety.   LORazepam (ATIVAN) 2 MG tablet Take 2 mg by mouth as needed. Take 1 tablet as directed by [facility] staff   melatonin 3 MG TABS tablet Take 3 mg by mouth daily.   NYAMYC powder Apply 1 application  topically as needed (groin rash).   nystatin (MYCOSTATIN/NYSTOP) powder Apply 1 Application topically as needed.   polyethylene glycol (MIRALAX / GLYCOLAX) 17 g packet Take 17 g by mouth as needed for mild constipation.   QUEtiapine (SEROQUEL) 25 MG tablet Take 25 mg by mouth every morning.   QUEtiapine (SEROQUEL) 50 MG tablet Take 50 mg by mouth at bedtime.   sodium chloride 1 g tablet Take 1 g by mouth once.   Sodium Fluoride (PREVIDENT 5000 BOOSTER PLUS) 1.1 % PSTE Place onto teeth at bedtime. Brush on teeth with a toothbrush after PM mouth care. Spit out excess and do not rinse   triamcinolone cream (KENALOG) 0.5 % Apply 1 Application topically every 12 (twelve) hours as needed.   zinc oxide 20 % ointment Apply 1 application  topically as needed (apply to buttocks for redness).   No facility-administered encounter medications on file as of 11/03/2022.    Review of Systems  Unable to perform ROS: Dementia    Immunization History  Administered Date(s) Administered   Fluad Quad(high Dose 65+) 12/07/2021   Influenza-Unspecified 11/11/2019, 11/11/2020   Janssen (J&J) SARS-COV-2 Vaccination 01/13/2022   Moderna SARS-COV2 Booster Vaccination 02/02/2021   Moderna Sars-Covid-2 Vaccination  03/06/2019, 04/03/2019, 12/18/2019, 11/11/2020   PPD Test 05/05/2019   Pfizer Covid-19 Vaccine Bivalent Booster 57yrs & up 07/27/2021   Pneumococcal Conjugate-13 12/09/2016   Pneumococcal Polysaccharide-23 12/09/2013   Tdap 03/12/2012   Zoster, Live 11/26/2012   Zoster, Unspecified 11/26/2012   Pertinent  Health Maintenance Due  Topic Date Due   INFLUENZA VACCINE  09/14/2022   HEMOGLOBIN A1C  09/19/2022   FOOT EXAM  03/18/2023   OPHTHALMOLOGY EXAM  05/29/2023   MAMMOGRAM  Discontinued   DEXA SCAN  Discontinued   Colonoscopy  Discontinued      10/26/2020   12:00 PM 03/04/2021    2:48 PM 01/19/2022   11:42 AM 02/17/2022    1:22 PM 05/22/2022    1:02 PM  Fall Risk  Falls in the past year?  1 0 0 0  Was there an injury with Fall?  0 0 0 0  Fall Risk Category Calculator  1 0 0 0  Fall Risk Category (Retired)  Low Low Low   (RETIRED) Patient Fall Risk Level High fall risk Moderate fall risk Low fall risk Low fall risk   Patient at Risk for Falls Due to  History of fall(s);Impaired balance/gait;Impaired mobility No Fall Risks No Fall Risks History of fall(s);Impaired balance/gait;Impaired mobility;Mental status change  Fall risk Follow up  Falls evaluation completed;Education provided;Falls prevention discussed Falls evaluation completed Falls evaluation completed Falls evaluation completed;Education provided;Falls prevention discussed   Functional Status Survey:    Vitals:   11/03/22 1613  BP: (!) 99/53  Pulse: 60  Resp: 17  Temp: 97.6 F (36.4 C)  SpO2: 96%  Weight: 145 lb 8 oz (66 kg)  Height: 5\' 5"  (1.651 m)   Body mass index is 24.21 kg/m. Physical Exam Vitals reviewed.  Constitutional:      General: She is not in acute distress. HENT:     Head: Normocephalic.     Right Ear: There is no impacted cerumen.     Left Ear: There is no impacted cerumen.     Nose: Nose normal.     Mouth/Throat:     Mouth: Mucous membranes are moist.     Comments: Unable to examine Eyes:      General:        Right eye: No discharge.        Left eye: No discharge.  Cardiovascular:     Rate and Rhythm: Normal rate and regular rhythm.     Pulses: Normal pulses.     Heart sounds: Normal heart sounds.  Pulmonary:     Effort: Pulmonary effort is normal. No respiratory distress.     Breath sounds: Normal breath sounds. No wheezing.  Abdominal:     General: Bowel sounds are normal. There is no distension.     Palpations: Abdomen is soft.     Tenderness: There is no abdominal tenderness.  Musculoskeletal:     Cervical back: Neck supple.     Right lower leg: No edema.     Comments: Left BKA  Skin:    General: Skin is warm and dry.     Capillary Refill: Capillary refill takes less than 2 seconds.  Neurological:     General: No focal deficit present.     Mental Status: She is alert. Mental status is at baseline.     Motor: Weakness present.     Gait: Gait abnormal.     Comments: hoyer  Psychiatric:        Mood and Affect: Mood normal.     Comments: Nonverbal, does not follow commands, alert to self     Labs reviewed: Recent Labs    03/21/22 0000  NA 139  K 4.3  CL 103  CO2 28*  BUN 29*  CREATININE 0.6  CALCIUM 9.6   Recent Labs    03/21/22 0000  AST 11*  ALT 7  ALKPHOS 42  ALBUMIN 3.9   Recent Labs    03/21/22 0000  WBC 9.2  NEUTROABS 6,182.00  HGB 15.5  HCT 45  PLT 201   Lab Results  Component Value Date   TSH 3.19 12/26/2021   Lab Results  Component Value Date   HGBA1C 7.3 03/21/2022   Lab Results  Component Value Date   CHOL 188 12/26/2021   HDL 60 12/26/2021   LDLCALC 106 12/26/2021   TRIG 119 12/26/2021    Significant Diagnostic Results in last 30 days:  No results found.  Assessment/Plan 1. Type 2 diabetes mellitus with peripheral vascular disease (HCC) - A1c stable - no hypoglycemia, sugars 150-190's - not on medication  2. Frontotemporal dementia with behavioral disturbance (HCC) - followed by hospice - weights  trending down - dependent with all ADLs  - cont Depakote and Seroquel  3. Hx of BKA, left (HCC) - d/t  acute limb ischemia - hoyer transfer - cont skilled nursing  4. Essential hypertension - controlled without medication  5. Acquired hypothyroidism - TSH stable - cont levothyroxine  6. Hyponatremia - Na+ stable - cont sodium tablets   7. Depression, recurrent (HCC) - no changes in mood  - cont Celex and ativan prn  8. Anxiety - cont Celexa and ativan prn  9. Pain, dental - 09/20 tooth abstraction - unable to examine mouth - cont tylenol prn  10. Aortic atherosclerosis (HCC) - noted CT abdomen 10/2020    Family/ staff Communication: plan discussed with patient and nurse  Labs/tests ordered:  none

## 2022-11-23 ENCOUNTER — Encounter: Payer: Self-pay | Admitting: Internal Medicine

## 2022-11-23 ENCOUNTER — Non-Acute Institutional Stay (SKILLED_NURSING_FACILITY): Admitting: Internal Medicine

## 2022-11-23 DIAGNOSIS — E039 Hypothyroidism, unspecified: Secondary | ICD-10-CM | POA: Diagnosis not present

## 2022-11-23 DIAGNOSIS — I10 Essential (primary) hypertension: Secondary | ICD-10-CM

## 2022-11-23 DIAGNOSIS — Z89612 Acquired absence of left leg above knee: Secondary | ICD-10-CM | POA: Diagnosis not present

## 2022-11-23 DIAGNOSIS — F339 Major depressive disorder, recurrent, unspecified: Secondary | ICD-10-CM

## 2022-11-23 DIAGNOSIS — G3109 Other frontotemporal dementia: Secondary | ICD-10-CM

## 2022-11-23 DIAGNOSIS — E1151 Type 2 diabetes mellitus with diabetic peripheral angiopathy without gangrene: Secondary | ICD-10-CM

## 2022-11-23 DIAGNOSIS — F02818 Dementia in other diseases classified elsewhere, unspecified severity, with other behavioral disturbance: Secondary | ICD-10-CM

## 2022-11-23 NOTE — Progress Notes (Signed)
Location:  [facility] Room Number: 29A Place of Service:  SNF (31)/Hospice Provider:  Mahlon Gammon, MD   Mahlon Gammon, MD  Patient Care Team: Mahlon Gammon, MD as PCP - General (Internal Medicine)  Extended Emergency Contact Information Primary Emergency Contact: [facility] Phone: [phone] Relation: Sister Secondary Emergency Contact: [facility] Phone: [phone] Mobile Phone: [phone] Relation: Brother  Code Status:  DNR/Hospice Goals of care: Advanced Directive information    11/23/2022    3:40 PM  Advanced Directives  Does Patient Have a Medical Advance Directive? Yes  Type of Estate agent of [place];Living will;Out of facility DNR (pink MOST or yellow form)  Does patient want to make changes to medical advance directive? No - Patient declined  Copy of Healthcare Power of Attorney in Chart? Yes - validated most recent copy scanned in chart (See row information)     Chief Complaint  Patient presents with   Medical Management of Chronic Issues    Patient is being for a routine visit     HPI:  Pt is a 72 y.o. female seen today for medical management of chronic diseases.    Lives in SNF Enrolled in Hospice   Patient was diagnosed with frontotemporal dementia in 2019.  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 She is retired Engineer, civil (consulting) Her sister is the POA   She is stable. No new Nursing issues. Behaviors controlled with Seroquel and Depakote No Falls Hoyer dependent HAS lost some weight needs help with feeding Is becoming more aphasic now  Wt Readings from Last 3 Encounters:  11/23/22 144 lb 9.6 oz (65.6 kg)  11/03/22 145 lb 8 oz (66 kg)  09/25/22 147 lb 3.2 oz (66.8 kg)   Past Medical  History:  Diagnosis Date   Dementia (HCC)    Dementia in other diseases classified elsewhere, unspecified severity, with other behavioral disturbance (HCC)    Diabetes mellitus without complication (HCC)    Essential (primary) hypertension    per matrix   Hx of BKA, left (HCC) 10/24/2020   Hypo-osmolality and hyponatremia    Hypothyroidism, unspecified    Other frontotemporal neurocognitive disorder (CODE) (HCC)    Pure hypercholesterolemia, unspecified    Thyroid disease    Unspecified dementia, unspecified severity, without behavioral disturbance, psychotic disturbance, mood disturbance, and anxiety (HCC)    per matrix   History reviewed. No pertinent surgical history.  No Known Allergies  Outpatient Encounter Medications as of 11/23/2022  Medication Sig   acetaminophen (TYLENOL) 500 MG tablet Take 1,000 mg by mouth 2 (two) times daily as needed for fever (pain).   acetaminophen (TYLENOL) 500 MG tablet Take 500 mg by mouth 2 (two) times daily.   Camphor-Menthol-Methyl Sal (SALONPAS) 3.02-19-08 % PTCH Place 1 patch onto the skin See admin instructions. Apply one patch transdermally to left aka stump daily as needed for pain   citalopram (CELEXA) 10 MG tablet Take 10 mg by mouth every morning.   divalproex (DEPAKOTE SPRINKLE) 125 MG capsule Take 250 mg by mouth 3 (three) times daily.   Docusate Sodium 100 MG capsule Take 100 mg by mouth 2 (two) times daily.   guaiFENesin (ROBITUSSIN) 100 MG/5ML liquid Take 10 mLs by mouth every 4 (four) hours as needed for cough or congestion.   hydrocortisone (AQUANIL HC)  1 % lotion Apply 1 Application topically 2 (two) times daily. Forehead/Neck/Jawline for lesions and dryness.   KETOCONAZOLE, TOPICAL, 1 % SHAM Apply 1 Application topically once a week. Thursday.   levothyroxine (SYNTHROID) 50 MCG tablet Take 50 mcg by mouth daily before breakfast.   LORazepam (ATIVAN) 2 MG tablet Take 2 mg by mouth as needed. Take 1 tablet as directed by [facility] staff   melatonin 3 MG TABS tablet Take 3 mg by mouth daily.   NYAMYC powder Apply 1 application  topically as needed (groin rash).   nystatin (MYCOSTATIN/NYSTOP) powder Apply 1 Application topically as needed.   polyethylene glycol (MIRALAX / GLYCOLAX) 17 g packet Take 17 g by mouth as needed for mild constipation.   QUEtiapine (SEROQUEL) 25 MG tablet Take 25 mg by mouth every morning.   QUEtiapine (SEROQUEL) 50 MG tablet Take 50 mg by mouth at bedtime.   sodium chloride 1 g tablet Take 1 g by mouth once.   Sodium Fluoride (PREVIDENT 5000 BOOSTER PLUS) 1.1 % PSTE Place onto teeth at bedtime. Brush on teeth with a toothbrush after PM mouth care. Spit out excess and do not rinse   zinc oxide 20 % ointment Apply 1 application  topically as needed (apply to buttocks for redness).   Amino Acids-Protein Hydrolys (PRO-STAT PO) Take 30 mLs by mouth daily. (Patient not taking: Reported on 11/23/2022)   LORazepam (ATIVAN) 0.5 MG tablet Take 1 tablet (0.5 mg total) by mouth 2 (two) times daily as needed for anxiety. (Patient not taking: Reported on 11/23/2022)   triamcinolone cream (KENALOG) 0.5 % Apply 1 Application topically every 12 (twelve) hours as needed. (Patient not taking: Reported on 11/23/2022)   No facility-administered encounter medications on file as of 11/23/2022.    Review of Systems  Unable to perform ROS: Dementia    Immunization History  Administered Date(s) Administered   Fluad Quad(high Dose 65+) 12/07/2021   Influenza-Unspecified 11/11/2019, 11/11/2020   Janssen (J&J) SARS-COV-2 Vaccination 01/13/2022   Moderna SARS-COV2 Booster Vaccination 02/02/2021   Moderna Sars-Covid-2 Vaccination 03/06/2019, 04/03/2019, 12/18/2019, 11/11/2020   PPD Test 05/05/2019   Pfizer Covid-19 Vaccine Bivalent Booster 70yrs & up 07/27/2021   Pneumococcal Conjugate-13 12/09/2016   Pneumococcal Polysaccharide-23 12/09/2013   Tdap 03/12/2012   Zoster, Live 11/26/2012   Zoster,  Unspecified 11/26/2012   Pertinent  Health Maintenance Due  Topic Date Due   INFLUENZA VACCINE  09/14/2022   HEMOGLOBIN A1C  09/19/2022   FOOT EXAM  03/18/2023   OPHTHALMOLOGY EXAM  05/29/2023   MAMMOGRAM  Discontinued   DEXA SCAN  Discontinued   Colonoscopy  Discontinued      10/26/2020   12:00 PM 03/04/2021    2:48 PM 01/19/2022   11:42 AM 02/17/2022    1:22 PM 05/22/2022    1:02 PM  Fall Risk  Falls in the past year?  1 0 0 0  Was there an injury with Fall?  0 0 0 0  Fall Risk Category Calculator  1 0 0 0  Fall Risk Category (Retired)  Low Low Low   (RETIRED) Patient Fall Risk Level High fall risk Moderate fall risk Low fall risk Low fall risk   Patient at Risk for Falls Due to  History of fall(s);Impaired balance/gait;Impaired mobility No Fall Risks No Fall Risks History of fall(s);Impaired balance/gait;Impaired mobility;Mental status change  Fall risk Follow up  Falls evaluation completed;Education provided;Falls prevention discussed Falls evaluation completed Falls evaluation completed Falls evaluation completed;Education provided;Falls prevention discussed  Functional Status Survey:    Vitals:   11/23/22 1534  BP: 95/65  Pulse: 93  Resp: 18  Temp: (!) 96.6 F (35.9 C)  TempSrc: Temporal  SpO2: 95%  Weight: 144 lb 9.6 oz (65.6 kg)  Height: 5\' 5"  (1.651 m)   Body mass index is 24.06 kg/m. Physical Exam Vitals reviewed.  Constitutional:      Appearance: Normal appearance.  HENT:     Head: Normocephalic.     Nose: Nose normal.     Mouth/Throat:     Mouth: Mucous membranes are moist.     Pharynx: Oropharynx is clear.  Eyes:     Pupils: Pupils are equal, round, and reactive to light.  Cardiovascular:     Rate and Rhythm: Normal rate and regular rhythm.     Pulses: Normal pulses.     Heart sounds: Normal heart sounds. No murmur heard. Pulmonary:     Effort: Pulmonary effort is normal.     Breath sounds: Normal breath sounds.  Abdominal:     General: Abdomen  is flat. Bowel sounds are normal.     Palpations: Abdomen is soft.  Musculoskeletal:        General: No swelling.     Cervical back: Neck supple.     Comments: S/p Left AKA  Skin:    General: Skin is warm.  Neurological:     General: No focal deficit present.     Mental Status: She is alert.  Psychiatric:        Mood and Affect: Mood normal.        Thought Content: Thought content normal.     Labs reviewed: Recent Labs    03/21/22 0000  NA 139  K 4.3  CL 103  CO2 28*  BUN 29*  CREATININE 0.6  CALCIUM 9.6   Recent Labs    03/21/22 0000  AST 11*  ALT 7  ALKPHOS 42  ALBUMIN 3.9   Recent Labs    03/21/22 0000  WBC 9.2  NEUTROABS 6,182.00  HGB 15.5  HCT 45  PLT 201   Lab Results  Component Value Date   TSH 3.19 12/26/2021   Lab Results  Component Value Date   HGBA1C 7.3 03/21/2022   Lab Results  Component Value Date   CHOL 188 12/26/2021   HDL 60 12/26/2021   LDLCALC 106 12/26/2021   TRIG 119 12/26/2021    Significant Diagnostic Results in last 30 days:  No results found.  Assessment/Plan 1. Frontotemporal dementia with behavioral disturbance (HCC) Full ADL Care Hospice enrolled On Seroquel and Depakote for Her Behaviors which are controllled 2. Essential hypertension Not on Any meds  3. Acquired hypothyroidism TSH normal in 11/23  4. Depression, recurrent (HCC) On Celexa  5. S/P AKA (above knee amputation), left (HCC)   6. Type 2 diabetes mellitus with peripheral vascular disease (HCC) No Meds anymore 7 h/o SAIDH On Sodium tabs    Family/ staff Communication:   Labs/tests ordered:

## 2022-12-22 ENCOUNTER — Non-Acute Institutional Stay (SKILLED_NURSING_FACILITY): Payer: Medicare Other | Admitting: Orthopedic Surgery

## 2022-12-22 ENCOUNTER — Encounter: Payer: Self-pay | Admitting: Orthopedic Surgery

## 2022-12-22 DIAGNOSIS — Z89612 Acquired absence of left leg above knee: Secondary | ICD-10-CM | POA: Diagnosis not present

## 2022-12-22 DIAGNOSIS — G3109 Other frontotemporal dementia: Secondary | ICD-10-CM

## 2022-12-22 DIAGNOSIS — F02818 Dementia in other diseases classified elsewhere, unspecified severity, with other behavioral disturbance: Secondary | ICD-10-CM

## 2022-12-22 DIAGNOSIS — I10 Essential (primary) hypertension: Secondary | ICD-10-CM | POA: Diagnosis not present

## 2022-12-22 DIAGNOSIS — E1151 Type 2 diabetes mellitus with diabetic peripheral angiopathy without gangrene: Secondary | ICD-10-CM | POA: Diagnosis not present

## 2022-12-22 DIAGNOSIS — F418 Other specified anxiety disorders: Secondary | ICD-10-CM

## 2022-12-22 DIAGNOSIS — E039 Hypothyroidism, unspecified: Secondary | ICD-10-CM

## 2022-12-22 DIAGNOSIS — E871 Hypo-osmolality and hyponatremia: Secondary | ICD-10-CM

## 2022-12-22 NOTE — Addendum Note (Signed)
Addended byHazle Nordmann E on: 12/22/2022 03:28 PM   Modules accepted: Level of Service

## 2022-12-22 NOTE — Progress Notes (Signed)
Location:   [facility]  Nursing Home Room Number: 29-A Place of Service:  SNF [phone]) Provider:  Hazle Nordmann, NP  PCP: Mahlon Gammon, MD  Patient Care Team: Mahlon Gammon, MD as PCP - General (Internal Medicine)  Extended Emergency Contact Information Primary Emergency Contact: [facility] Phone: [phone] Relation: Sister Secondary Emergency Contact: [facility] Phone: [phone] Mobile Phone: [phone] Relation: Brother  Code Status:  DNR Goals of care: Advanced Directive information    12/22/2022   10:58 AM  Advanced Directives  Does Patient Have a Medical Advance Directive? Yes  Type of Estate agent of [place];Living will;Out of facility DNR (pink MOST or yellow form)  Does patient want to make changes to medical advance directive? No - Patient declined  Copy of Healthcare Power of Attorney in Chart? Yes - validated most recent copy scanned in chart (See row information)     Chief Complaint  Patient presents with   Medical Management of Chronic Issues    Routine Visit.    Immunizations    Discuss the need for for PNE vaccine, and Covid Booster.     HPI:  Pt is a 72 y.o. female seen today for medical management of chronic diseases.    She currently resides on the skilled nursing unit at [facility] due to frontotemporal dementia. PMH: HTN, hypothyroidism, T2DM, alcohol abuse, psychosis, wernicke-korsakoff syndrome, thrombocytopenia.      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   Recent weights:  11/07- 144.5 lbs  10/01- 156 lbs  09/03- 145.5 lbs  Recent blood pressures:  11/05- 132/76  10/29- 120/70  10/22- 120/69   Past Medical History:  Diagnosis Date   Dementia (HCC)    Dementia in other diseases classified elsewhere, unspecified severity, with other behavioral disturbance (HCC)    Diabetes mellitus without complication (HCC)    Essential (primary) hypertension    per matrix   Hx of BKA, left (HCC) 10/24/2020   Hypo-osmolality and hyponatremia    Hypothyroidism, unspecified    Other frontotemporal neurocognitive disorder (CODE) (HCC)    Pure hypercholesterolemia, unspecified    Thyroid disease    Unspecified dementia, unspecified severity, without behavioral disturbance, psychotic disturbance, mood disturbance, and anxiety (HCC)    per matrix   History reviewed. No pertinent surgical history.  No Known Allergies  Allergies as of 12/22/2022   No Known Allergies      Medication List        Accurate as of December 22, 2022 10:59 AM. If you have any questions, ask your nurse or doctor.          acetaminophen 500 MG tablet Commonly known as: TYLENOL Take 1,000 mg by mouth 2 (two) times daily as needed for fever (pain).   acetaminophen 500 MG tablet Commonly known as: TYLENOL Take 500 mg by mouth 2 (two) times daily.   Aquanil HC 1 % lotion Generic  drug: hydrocortisone Apply 1 Application topically as needed. Forehead/Neck/Jawline for lesions and dryness.   citalopram 10 MG tablet Commonly known as: CELEXA Take 10 mg by mouth every morning.   divalproex 125 MG capsule Commonly known as: DEPAKOTE SPRINKLE Take 250 mg by mouth 3 (three) times  daily.   Docusate Sodium 100 MG capsule Take 100 mg by mouth 2 (two) times daily.   guaiFENesin 100 MG/5ML liquid Commonly known as: ROBITUSSIN Take 10 mLs by mouth every 4 (four) hours as needed for cough or congestion.   KETOCONAZOLE (TOPICAL) 1 % Sham Apply 1 Application topically once a week. Thursday.   levothyroxine 50 MCG tablet Commonly known as: SYNTHROID Take 50 mcg by mouth daily before breakfast.   LORazepam 2 MG tablet Commonly known as: ATIVAN Take 2 mg by mouth as needed. Take 1 tablet as directed by [facility] staff   melatonin 3 MG Tabs tablet Take 3 mg by mouth daily.   nystatin powder Commonly known as: MYCOSTATIN/NYSTOP Apply 1 Application topically as needed.   Nyamyc powder Generic drug: nystatin Apply 1 application  topically as needed (groin rash).   polyethylene glycol 17 g packet Commonly known as: MIRALAX / GLYCOLAX Take 17 g by mouth as needed for mild constipation.   PreviDent 5000 Booster Plus 1.1 % Pste Generic drug: Sodium Fluoride Place onto teeth at bedtime. Brush on teeth with a toothbrush after PM mouth care. Spit out excess and do not rinse   QUEtiapine 50 MG tablet Commonly known as: SEROQUEL Take 50 mg by mouth at bedtime.   QUEtiapine 25 MG tablet Commonly known as: SEROQUEL Take 25 mg by mouth every morning.   Salonpas 3.02-19-08 % Ptch Generic drug: Camphor-Menthol-Methyl Sal Place 1 patch onto the skin See admin instructions. Apply one patch transdermally to left aka stump daily as needed for pain   sodium chloride 1 g tablet Take 1 g by mouth once.   zinc oxide 20 % ointment Apply 1 application  topically as needed (apply to buttocks for redness).        Review of Systems  Unable to perform ROS: Patient nonverbal    Immunization History  Administered Date(s) Administered   Fluad Quad(high Dose 65+) 12/07/2021   Influenza, High Dose Seasonal PF 12/13/2022   Influenza-Unspecified 11/11/2019,  11/11/2020   Janssen (J&J) SARS-COV-2 Vaccination 01/13/2022   Moderna SARS-COV2 Booster Vaccination 02/02/2021   Moderna Sars-Covid-2 Vaccination 03/06/2019, 04/03/2019, 12/18/2019, 11/11/2020   PPD Test 05/05/2019   Pfizer Covid-19 Vaccine Bivalent Booster 43yrs & up 07/27/2021   Pneumococcal Conjugate-13 12/09/2016   Pneumococcal Polysaccharide-23 12/09/2013   Tdap 03/12/2012   Zoster Recombinant(Shingrix) 11/26/2012, 01/13/2022   Zoster, Live 11/26/2012   Pertinent  Health Maintenance Due  Topic Date Due   FOOT EXAM  03/18/2023   HEMOGLOBIN A1C  03/31/2023   OPHTHALMOLOGY EXAM  05/29/2023   INFLUENZA VACCINE  Completed   MAMMOGRAM  Discontinued   DEXA SCAN  Discontinued   Colonoscopy  Discontinued      10/26/2020   12:00 PM 03/04/2021    2:48 PM 01/19/2022   11:42 AM 02/17/2022    1:22 PM 05/22/2022    1:02 PM  Fall Risk  Falls in the past year?  1 0 0 0  Was there an injury with Fall?  0 0 0 0  Fall Risk Category Calculator  1 0 0 0  Fall Risk Category (Retired)  Low Low Low   (RETIRED) Patient Fall Risk Level High fall  risk Moderate fall risk Low fall risk Low fall risk   Patient at Risk for Falls Due to  History of fall(s);Impaired balance/gait;Impaired mobility No Fall Risks No Fall Risks History of fall(s);Impaired balance/gait;Impaired mobility;Mental status change  Fall risk Follow up  Falls evaluation completed;Education provided;Falls prevention discussed Falls evaluation completed Falls evaluation completed Falls evaluation completed;Education provided;Falls prevention discussed   Functional Status Survey:    Vitals:   12/22/22 1052  BP: 132/76  Pulse: 71  Resp: 18  Temp: (!) 97.3 F (36.3 C)  SpO2: 98%  Weight: 144 lb 8 oz (65.5 kg)  Height: 5\' 5"  (1.651 m)   Body mass index is 24.05 kg/m. Physical Exam Vitals reviewed.  Constitutional:      General: She is not in acute distress. HENT:     Head: Normocephalic.     Right Ear: There is no impacted  cerumen.     Left Ear: There is no impacted cerumen.     Nose: Nose normal.     Mouth/Throat:     Mouth: Mucous membranes are moist.  Eyes:     General:        Right eye: No discharge.        Left eye: No discharge.  Cardiovascular:     Rate and Rhythm: Normal rate and regular rhythm.     Pulses: Normal pulses.     Heart sounds: Normal heart sounds.  Pulmonary:     Effort: Pulmonary effort is normal. No respiratory distress.     Breath sounds: Normal breath sounds. No wheezing.  Abdominal:     General: Bowel sounds are normal.     Palpations: Abdomen is soft.  Musculoskeletal:     Cervical back: Neck supple.     Right lower leg: No edema.     Comments: Left BKA  Skin:    General: Skin is warm.     Capillary Refill: Capillary refill takes less than 2 seconds.  Neurological:     General: No focal deficit present.     Mental Status: She is alert. Mental status is at baseline.     Motor: Weakness present.     Gait: Gait abnormal.     Comments: Hoyer, bed bound  Psychiatric:        Mood and Affect: Mood normal.     Comments: Nonverbal, does not follow commands     Labs reviewed: Recent Labs    03/21/22 0000 09/28/22 0000  NA 139 139  K 4.3 4.0  CL 103 104  CO2 28* 25*  BUN 29* 30*  CREATININE 0.6 0.5  CALCIUM 9.6 8.8   Recent Labs    03/21/22 0000  AST 11*  ALT 7  ALKPHOS 42  ALBUMIN 3.9   Recent Labs    03/21/22 0000  WBC 9.2  NEUTROABS 6,182.00  HGB 15.5  HCT 45  PLT 201   Lab Results  Component Value Date   TSH 3.19 12/26/2021   Lab Results  Component Value Date   HGBA1C 7.4 09/28/2022   Lab Results  Component Value Date   CHOL 188 12/26/2021   HDL 60 12/26/2021   LDLCALC 106 12/26/2021   TRIG 119 12/26/2021    Significant Diagnostic Results in last 30 days:  No results found.  Assessment/Plan 1. Type 2 diabetes mellitus with peripheral vascular disease (HCC) - A1c 7.4 - not on medication - sugars< 170's - no hypoglycemia -  eye exam 04/15  2. Frontotemporal dementia with behavioral disturbance (  HCC) - followed by hospice - tics have stopped - weights stable - cont Depakote and Seroquel   3. S/P AKA (above knee amputation), left (HCC) - cont skilled nursing care  4. Essential hypertension - controlled without medication  5. Acquired hypothyroidism - cont levothyroxine - TSH- future  6. Hyponatremia - Na+ stable  - cont sodium tablets  7. Depression with anxiety - no mood changes - cont Celexa    Family/ staff Communication: plan discussed with patient and nurse  Labs/tests ordered: needs TSH

## 2023-01-19 ENCOUNTER — Non-Acute Institutional Stay (SKILLED_NURSING_FACILITY): Payer: Medicare Other | Admitting: Orthopedic Surgery

## 2023-01-19 ENCOUNTER — Encounter: Payer: Self-pay | Admitting: Orthopedic Surgery

## 2023-01-19 DIAGNOSIS — F339 Major depressive disorder, recurrent, unspecified: Secondary | ICD-10-CM

## 2023-01-19 DIAGNOSIS — S81812A Laceration without foreign body, left lower leg, initial encounter: Secondary | ICD-10-CM | POA: Diagnosis not present

## 2023-01-19 DIAGNOSIS — E871 Hypo-osmolality and hyponatremia: Secondary | ICD-10-CM

## 2023-01-19 DIAGNOSIS — F419 Anxiety disorder, unspecified: Secondary | ICD-10-CM

## 2023-01-19 DIAGNOSIS — G3109 Other frontotemporal dementia: Secondary | ICD-10-CM

## 2023-01-19 DIAGNOSIS — F02818 Dementia in other diseases classified elsewhere, unspecified severity, with other behavioral disturbance: Secondary | ICD-10-CM

## 2023-01-19 DIAGNOSIS — E039 Hypothyroidism, unspecified: Secondary | ICD-10-CM

## 2023-01-19 DIAGNOSIS — Z89612 Acquired absence of left leg above knee: Secondary | ICD-10-CM | POA: Diagnosis not present

## 2023-01-19 DIAGNOSIS — E1151 Type 2 diabetes mellitus with diabetic peripheral angiopathy without gangrene: Secondary | ICD-10-CM | POA: Diagnosis not present

## 2023-01-19 DIAGNOSIS — I10 Essential (primary) hypertension: Secondary | ICD-10-CM

## 2023-01-19 NOTE — Progress Notes (Signed)
Location:  [facility] Room Number: N29A Place of Service:  SNF [phone]) Provider:  Priscella Mann, MD  Patient Care Team: Mahlon Gammon, MD as PCP - General (Internal Medicine)  Extended Emergency Contact Information Primary Emergency Contact: [facility] Phone: [phone] Relation: Sister Secondary Emergency Contact: [facility] Phone: [phone] Mobile Phone: [phone] Relation: Brother  Code Status:  DNR Goals of care: Advanced Directive information    01/19/2023    9:27 AM  Advanced Directives  Does Patient Have a Medical Advance Directive? Yes  Type of Estate agent of [place];Living will;Out of facility DNR (pink MOST or yellow form)  Does patient want to make changes to medical advance directive? No - Patient declined  Copy of Healthcare Power of Attorney in Chart? Yes - validated most recent copy scanned in chart (See row information)     Chief Complaint  Patient presents with   Medical Management of Chronic Issues    Routine visit and discuss Pneumonia vaccine.    HPI:  Pt is a 72 y.o. female seen today for medical management of chronic diseases.    She currently resides on the skilled nursing unit at [facility] due to frontotemporal dementia. PMH: HTN, hypothyroidism, T2DM, alcohol abuse, psychosis, wernicke-korsakoff syndrome, thrombocytopenia.     T2DM- A1c 7.4 (08/15)> was 7.3 (03/20/2022), sugars 140-160's, no  hypoglycemia, diet controlled, eye exam 05/29/2022, not on medication Frontotemporal dementia- followed by Hospice, MRI 2019 remote infarcts right anterior temporal lobe and right periventricular basal ganglia, marked severe cerebral atrophy temporal parietal lobes,dependent with all ADLs including feeding, nonverbal, tics of fake sneezing occur when she wants staff per nursing, remains on Seroquel and Depakote Left BKA- skin tear to stump on exam, due to acute  limb ischemia, ambulates with wheelchair/hoyer HTN- BUN/creat 30/0.5 09/28/2022, not on medication Hypothyroidism- TSH 3.19 12/26/2021, remains on levothyroxine Hyponatremia- Na 139 09/28/2022, remains on sodium tablets Depression and anxiety- remains on Celexa   Recent blood pressures:  12/03- 116/74  11/26- 117/75  11/19- 115/75  Recent weights:  12/06- 145.1 lbs  11/01- 144.5 lbs  10/02- 144.6 lbs   Past Medical History:  Diagnosis Date   Dementia (HCC)    Dementia in other diseases classified elsewhere, unspecified severity, with other behavioral disturbance (HCC)    Diabetes mellitus without complication (HCC)    Essential (primary) hypertension    per matrix   Hx of BKA, left (HCC) 10/24/2020   Hypo-osmolality and hyponatremia    Hypothyroidism, unspecified    Other frontotemporal neurocognitive disorder (CODE) (HCC)    Pure hypercholesterolemia, unspecified    Thyroid disease    Unspecified dementia, unspecified severity, without behavioral disturbance, psychotic disturbance, mood disturbance, and anxiety (HCC)    per matrix   History reviewed. No pertinent surgical history.  No Known Allergies  Outpatient Encounter Medications as of 01/19/2023  Medication Sig   acetaminophen (TYLENOL) 500 MG tablet Take 1,000 mg by mouth 2 (two) times daily as needed for fever (pain).   acetaminophen (TYLENOL) 500 MG tablet Take 500 mg by mouth 2 (two) times daily.   Camphor-Menthol-Methyl Sal (SALONPAS) 3.02-19-08 % PTCH Place 1 patch onto the skin See admin instructions. Apply one patch transdermally to left aka stump daily as needed for pain   citalopram (CELEXA) 10 MG tablet Take 10 mg by mouth every morning.   divalproex (DEPAKOTE SPRINKLE) 125 MG capsule Take 250 mg by mouth 3 (three) times daily.   Docusate  Sodium 100 MG capsule Take 100 mg by mouth 2 (two) times daily.   guaiFENesin (ROBITUSSIN) 100 MG/5ML liquid Take 10 mLs by mouth every 4 (four) hours as needed for cough or  congestion.   hydrocortisone (AQUANIL HC) 1 % lotion Apply 1 Application topically as needed. Forehead/Neck/Jawline for lesions and dryness.   KETOCONAZOLE, TOPICAL, 1 % SHAM Apply 1 Application topically once a week. Thursday.   levothyroxine (SYNTHROID) 50 MCG tablet Take 50 mcg by mouth daily before breakfast.   melatonin 3 MG TABS tablet Take 3 mg by mouth daily.   NYAMYC powder Apply 1 application  topically as needed (groin rash).   nystatin (MYCOSTATIN/NYSTOP) powder Apply 1 Application topically as needed.   polyethylene glycol (MIRALAX / GLYCOLAX) 17 g packet Take 17 g by mouth as needed for mild constipation.   QUEtiapine (SEROQUEL) 25 MG tablet Take 25 mg by mouth every morning.   QUEtiapine (SEROQUEL) 50 MG tablet Take 50 mg by mouth at bedtime.   sodium chloride 1 g tablet Take 1 g by mouth once.   Sodium Fluoride (PREVIDENT 5000 BOOSTER PLUS) 1.1 % PSTE Place onto teeth at bedtime. Brush on teeth with a toothbrush after PM mouth care. Spit out excess and do not rinse   zinc oxide 20 % ointment Apply 1 application  topically as needed (apply to buttocks for redness).   No facility-administered encounter medications on file as of 01/19/2023.    Review of Systems  Unable to perform ROS: Dementia    Immunization History  Administered Date(s) Administered   Fluad Quad(high Dose 65+) 12/07/2021   Influenza, High Dose Seasonal PF 12/13/2022   Influenza-Unspecified 11/11/2019, 11/11/2020   Janssen (J&J) SARS-COV-2 Vaccination 01/13/2022   Moderna Covid-19 Vaccine Bivalent Booster 33yrs & up 12/20/2022   Moderna SARS-COV2 Booster Vaccination 02/02/2021   Moderna Sars-Covid-2 Vaccination 03/06/2019, 04/03/2019, 12/18/2019, 11/11/2020   PPD Test 05/05/2019   Pfizer Covid-19 Vaccine Bivalent Booster 55yrs & up 07/27/2021   Pneumococcal Conjugate-13 12/09/2016   Pneumococcal Polysaccharide-23 12/09/2013   Tdap 03/12/2012   Zoster Recombinant(Shingrix) 11/26/2012, 01/13/2022    Zoster, Live 11/26/2012   Pertinent  Health Maintenance Due  Topic Date Due   HEMOGLOBIN A1C  03/31/2023   OPHTHALMOLOGY EXAM  05/29/2023   FOOT EXAM  12/22/2023   INFLUENZA VACCINE  Completed   MAMMOGRAM  Discontinued   DEXA SCAN  Discontinued   Colonoscopy  Discontinued      03/04/2021    2:48 PM 01/19/2022   11:42 AM 02/17/2022    1:22 PM 05/22/2022    1:02 PM 12/22/2022    3:01 PM  Fall Risk  Falls in the past year? 1 0 0 0 0  Was there an injury with Fall? 0 0 0 0 0  Fall Risk Category Calculator 1 0 0 0 0  Fall Risk Category (Retired) Low Low Low    (RETIRED) Patient Fall Risk Level Moderate fall risk Low fall risk Low fall risk    Patient at Risk for Falls Due to History of fall(s);Impaired balance/gait;Impaired mobility No Fall Risks No Fall Risks History of fall(s);Impaired balance/gait;Impaired mobility;Mental status change History of fall(s);Impaired balance/gait;Impaired mobility  Fall risk Follow up Falls evaluation completed;Education provided;Falls prevention discussed Falls evaluation completed Falls evaluation completed Falls evaluation completed;Education provided;Falls prevention discussed Falls evaluation completed;Education provided;Falls prevention discussed   Functional Status Survey:    Vitals:   01/19/23 0925  BP: 116/74  Pulse: 82  Resp: 13  Temp: (!) 97.2 F (36.2 C)  SpO2: 97%  Weight: 145 lb 1.6 oz (65.8 kg)  Height: 5\' 5"  (1.651 m)   Body mass index is 24.15 kg/m. Physical Exam Vitals reviewed.  Constitutional:      General: She is not in acute distress. HENT:     Head: Normocephalic.     Right Ear: There is no impacted cerumen.     Left Ear: There is no impacted cerumen.     Nose: Nose normal.     Mouth/Throat:     Mouth: Mucous membranes are moist.  Eyes:     General:        Right eye: No discharge.        Left eye: No discharge.  Cardiovascular:     Rate and Rhythm: Normal rate and regular rhythm.     Pulses: Normal pulses.      Heart sounds: Normal heart sounds.  Pulmonary:     Effort: Pulmonary effort is normal. No respiratory distress.     Breath sounds: Normal breath sounds. No wheezing.  Abdominal:     General: Bowel sounds are normal.     Palpations: Abdomen is soft.  Musculoskeletal:     Cervical back: Neck supple.     Right lower leg: No edema.     Comments: Left AKA  Skin:    General: Skin is warm.     Capillary Refill: Capillary refill takes less than 2 seconds.     Comments: Small skin tear to left stump, CDI, no infection, surrounding skin intact.   Neurological:     General: No focal deficit present.     Mental Status: She is alert. Mental status is at baseline.     Motor: Weakness present.     Gait: Gait abnormal.  Psychiatric:        Mood and Affect: Mood normal.     Comments: Nonverbal, does not follow commands, alert to self     Labs reviewed: Recent Labs    03/21/22 0000 09/28/22 0000  NA 139 139  K 4.3 4.0  CL 103 104  CO2 28* 25*  BUN 29* 30*  CREATININE 0.6 0.5  CALCIUM 9.6 8.8   Recent Labs    03/21/22 0000  AST 11*  ALT 7  ALKPHOS 42  ALBUMIN 3.9   Recent Labs    03/21/22 0000  WBC 9.2  NEUTROABS 6,182.00  HGB 15.5  HCT 45  PLT 201   Lab Results  Component Value Date   TSH 3.19 12/26/2021   Lab Results  Component Value Date   HGBA1C 7.4 09/28/2022   Lab Results  Component Value Date   CHOL 188 12/26/2021   HDL 60 12/26/2021   LDLCALC 106 12/26/2021   TRIG 119 12/26/2021    Significant Diagnostic Results in last 30 days:  No results found.  Assessment/Plan 1. Noninfected skin tear of left lower extremity, initial encounter - onset unknown - located on left stump - no sign of infection - cont polymem prn  2. Type 2 diabetes mellitus with peripheral vascular disease (HCC) - controlled without medication - no hypoglycemias - sugars 140-160's - last eye exam 05/29/2022  3. Frontotemporal dementia with behavioral disturbance (HCC) -  ongoing - followed by hospice - dependent with all ADLs including feeding - tics of sneezing improved, only does when she wants staff - cont Depakote and Seroquel  4. S/P AKA (above knee amputation), left (HCC) - see above - cont skilled nursing   5. Essential hypertension - controlled  without medication  6. Acquired hypothyroidism - cont levothyroxine  7. Hyponatremia - Na+ stable - cont sodium tablets  8. Depression, recurrent (HCC) - no mood changes - cont Celexa  9. Anxiety - no increased panic or behaviors - cont Celexa    Family/ staff Communication: plan discussed with patient and nurse  Labs/tests ordered:  A1c and TSH 01/02

## 2023-02-15 DIAGNOSIS — E319 Polyglandular dysfunction, unspecified: Secondary | ICD-10-CM | POA: Diagnosis not present

## 2023-02-15 DIAGNOSIS — E039 Hypothyroidism, unspecified: Secondary | ICD-10-CM | POA: Diagnosis not present

## 2023-02-15 LAB — TSH: TSH: 1.55 (ref 0.41–5.90)

## 2023-02-15 LAB — HEMOGLOBIN A1C: Hemoglobin A1C: 7

## 2023-02-22 ENCOUNTER — Non-Acute Institutional Stay (SKILLED_NURSING_FACILITY): Admitting: Internal Medicine

## 2023-02-22 ENCOUNTER — Encounter: Payer: Self-pay | Admitting: Internal Medicine

## 2023-02-22 DIAGNOSIS — E1151 Type 2 diabetes mellitus with diabetic peripheral angiopathy without gangrene: Secondary | ICD-10-CM | POA: Diagnosis not present

## 2023-02-22 DIAGNOSIS — Z8639 Personal history of other endocrine, nutritional and metabolic disease: Secondary | ICD-10-CM

## 2023-02-22 DIAGNOSIS — E039 Hypothyroidism, unspecified: Secondary | ICD-10-CM | POA: Diagnosis not present

## 2023-02-22 DIAGNOSIS — I10 Essential (primary) hypertension: Secondary | ICD-10-CM | POA: Diagnosis not present

## 2023-02-22 DIAGNOSIS — G3109 Other frontotemporal dementia: Secondary | ICD-10-CM

## 2023-02-22 DIAGNOSIS — Z89612 Acquired absence of left leg above knee: Secondary | ICD-10-CM | POA: Diagnosis not present

## 2023-02-22 DIAGNOSIS — F418 Other specified anxiety disorders: Secondary | ICD-10-CM

## 2023-02-22 DIAGNOSIS — F02818 Other frontotemporal neurocognitive disorder: Secondary | ICD-10-CM

## 2023-02-22 NOTE — Progress Notes (Signed)
 Location:  [facility] Room Number: 29-A Place of Service:  SNF [phone]) Provider:  Charlanne Fredia CROME, MD  Patient Care Team: Charlanne Fredia CROME, MD as PCP - General (Internal Medicine)  Extended Emergency Contact Information Primary Emergency Contact: [facility] Phone: [phone] Relation: Sister Secondary Emergency Contact: [facility] Phone: [phone] Mobile Phone: [phone] Relation: Brother  Code Status:  DNR/Hospice Goals of care: Advanced Directive information    02/22/2023    1:30 PM  Advanced Directives  Does Patient Have a Medical Advance Directive? Yes  Type of Estate Agent of [place];Living will;Out of facility DNR (pink MOST or yellow form)  Does patient want to make changes to medical advance directive? No - Patient declined  Copy of Healthcare Power of Attorney in Chart? Yes - validated most recent copy scanned in chart (See row information)  Pre-existing out of facility DNR order (yellow form or pink MOST form) Yellow form placed in chart (order not valid for inpatient use)     Chief Complaint  Patient presents with   Medical Management of Chronic Issues    Routine visit. Discuss need for recommended pneumonia vaccine     HPI:  Pt is a 73 y.o. female seen today for medical management of chronic diseases.    Lives in SNF Enrolled in Hospice   Patient was diagnosed with frontotemporal dementia in 2019.  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 She is retired Engineer, Civil (consulting) Her sister is the POA    She is stable. No new Nursing issues. No Behavior issues Her weight is stable Non Ambulatory also Non Verbal Does not follow Commands either Needs help with her ALS and Feeding No Falls Wt Readings from Last 3  Encounters:  02/22/23 150 lb 8 oz (68.3 kg)  01/19/23 145 lb 1.6 oz (65.8 kg)  12/22/22 144 lb 8 oz (65.5 kg)   Past Medical History:  Diagnosis Date   Dementia (HCC)    Dementia in other diseases classified elsewhere, unspecified severity, with other behavioral disturbance (HCC)    Diabetes mellitus without complication (HCC)    Essential (primary) hypertension    per matrix   Hx of BKA, left (HCC) 10/24/2020   Hypo-osmolality and hyponatremia    Hypothyroidism, unspecified    Other frontotemporal neurocognitive disorder (CODE) (HCC)    Pure hypercholesterolemia, unspecified    Thyroid  disease    Unspecified dementia, unspecified severity, without behavioral disturbance, psychotic disturbance, mood disturbance, and anxiety (HCC)    per matrix   History reviewed. No pertinent surgical history.  No Known Allergies  Outpatient Encounter Medications as of 02/22/2023  Medication Sig   acetaminophen  (TYLENOL ) 500 MG tablet Take 1,000 mg by mouth 2 (two) times daily as needed for fever (pain).   acetaminophen  (TYLENOL ) 500 MG tablet Take 500 mg by mouth 2 (two) times daily.   Camphor-Menthol-Methyl Sal (SALONPAS) 3.02-19-08 % PTCH Place 1 patch onto the skin See admin instructions. Apply one patch transdermally to left aka stump daily as needed for pain   citalopram  (CELEXA ) 10 MG tablet Take 10 mg by mouth every morning.   divalproex (DEPAKOTE SPRINKLE) 125 MG capsule Take 250 mg by mouth 3 (three) times daily.   Docusate Sodium  100 MG capsule Take 100 mg by mouth 2 (two) times daily.   guaiFENesin (ROBITUSSIN) 100  MG/5ML liquid Take 10 mLs by mouth every 4 (four) hours as needed for cough or congestion.   hydrocortisone  (AQUANIL HC) 1 % lotion Apply 1 Application topically as needed. Forehead/Neck/Jawline for lesions and dryness.   KETOCONAZOLE , TOPICAL, 1 % SHAM Apply 1 Application topically once a week. Thursday.   lactose free nutrition (BOOST) LIQD Take 237 mLs by mouth 3 (three)  times daily. After or between meals for diet supplement   levothyroxine  (SYNTHROID ) 50 MCG tablet Take 50 mcg by mouth daily before breakfast.   LORazepam  (ATIVAN ) 0.5 MG tablet Take 0.5 mg by mouth every 12 (twelve) hours as needed for anxiety.   melatonin 3 MG TABS tablet Take 3 mg by mouth daily.   NYAMYC  powder Apply 1 application  topically as needed (groin rash).   nystatin  (MYCOSTATIN /NYSTOP ) powder Apply 1 Application topically as needed.   polyethylene glycol (MIRALAX  / GLYCOLAX ) 17 g packet Take 17 g by mouth as needed for mild constipation.   QUEtiapine  (SEROQUEL ) 25 MG tablet Take 25 mg by mouth every morning.   QUEtiapine  (SEROQUEL ) 50 MG tablet Take 50 mg by mouth at bedtime.   sodium chloride  1 g tablet Take 1 g by mouth once.   Sodium Fluoride (PREVIDENT 5000 BOOSTER PLUS) 1.1 % PSTE Place onto teeth at bedtime. Brush on teeth with a toothbrush after PM mouth care. Spit out excess and do not rinse   zinc  oxide 20 % ointment Apply 1 application  topically as needed (apply to buttocks for redness).   No facility-administered encounter medications on file as of 02/22/2023.    Review of Systems  Unable to perform ROS: Dementia    Immunization History  Administered Date(s) Administered   Fluad Quad(high Dose 65+) 12/07/2021   Influenza, High Dose Seasonal PF 12/13/2022   Influenza-Unspecified 11/11/2019, 11/11/2020   Janssen (J&J) SARS-COV-2 Vaccination 01/13/2022   Moderna Covid-19 Vaccine Bivalent Booster 40yrs & up 12/20/2022   Moderna SARS-COV2 Booster Vaccination 02/02/2021   Moderna Sars-Covid-2 Vaccination 03/06/2019, 04/03/2019, 12/18/2019, 11/11/2020   PPD Test 05/05/2019   Pfizer Covid-19 Vaccine Bivalent Booster 29yrs & up 07/27/2021   Pneumococcal Conjugate-13 12/09/2016   Pneumococcal Polysaccharide-23 12/09/2013   Tdap 03/12/2012   Zoster Recombinant(Shingrix) 11/26/2012, 01/13/2022   Zoster, Live 11/26/2012   Pertinent  Health Maintenance Due  Topic Date  Due   OPHTHALMOLOGY EXAM  05/29/2023   HEMOGLOBIN A1C  08/15/2023   FOOT EXAM  12/22/2023   INFLUENZA VACCINE  Completed   MAMMOGRAM  Discontinued   DEXA SCAN  Discontinued   Colonoscopy  Discontinued      01/19/2022   11:42 AM 02/17/2022    1:22 PM 05/22/2022    1:02 PM 12/22/2022    3:01 PM 01/19/2023   12:46 PM  Fall Risk  Falls in the past year? 0 0 0 0 0  Was there an injury with Fall? 0 0 0 0 0  Fall Risk Category Calculator 0 0 0 0 0  Fall Risk Category (Retired) Low Low     (RETIRED) Patient Fall Risk Level Low fall risk Low fall risk     Patient at Risk for Falls Due to No Fall Risks No Fall Risks History of fall(s);Impaired balance/gait;Impaired mobility;Mental status change History of fall(s);Impaired balance/gait;Impaired mobility History of fall(s);Impaired balance/gait;Impaired mobility  Fall risk Follow up Falls evaluation completed Falls evaluation completed Falls evaluation completed;Education provided;Falls prevention discussed Falls evaluation completed;Education provided;Falls prevention discussed Falls evaluation completed;Education provided;Falls prevention discussed   Functional Status Survey:  Vitals:   02/22/23 1328  BP: 131/79  Pulse: (!) 104  Temp: 98.1 F (36.7 C)  SpO2: 96%  Weight: 150 lb 8 oz (68.3 kg)  Height: 5' 5 (1.651 m)   Body mass index is 25.04 kg/m. Physical Exam Vitals reviewed.  Constitutional:      Appearance: Normal appearance.  HENT:     Head: Normocephalic.     Nose: Nose normal.     Mouth/Throat:     Mouth: Mucous membranes are moist.     Pharynx: Oropharynx is clear.  Eyes:     Pupils: Pupils are equal, round, and reactive to light.  Cardiovascular:     Rate and Rhythm: Normal rate and regular rhythm.     Pulses: Normal pulses.     Heart sounds: Normal heart sounds. No murmur heard. Pulmonary:     Effort: Pulmonary effort is normal.     Breath sounds: Normal breath sounds.  Abdominal:     General: Abdomen is flat.  Bowel sounds are normal.     Palpations: Abdomen is soft.  Musculoskeletal:        General: No swelling.     Cervical back: Neck supple.     Comments: Left AKA  Skin:    General: Skin is warm.  Neurological:     General: No focal deficit present.     Mental Status: She is alert.  Psychiatric:        Mood and Affect: Mood normal.        Thought Content: Thought content normal.     Labs reviewed: Recent Labs    03/21/22 0000 09/28/22 0000  NA 139 139  K 4.3 4.0  CL 103 104  CO2 28* 25*  BUN 29* 30*  CREATININE 0.6 0.5  CALCIUM 9.6 8.8   Recent Labs    03/21/22 0000  AST 11*  ALT 7  ALKPHOS 42  ALBUMIN 3.9   Recent Labs    03/21/22 0000  WBC 9.2  NEUTROABS 6,182.00  HGB 15.5  HCT 45  PLT 201   Lab Results  Component Value Date   TSH 1.55 02/15/2023   Lab Results  Component Value Date   HGBA1C 7.0 02/15/2023   Lab Results  Component Value Date   CHOL 188 12/26/2021   HDL 60 12/26/2021   LDLCALC 106 12/26/2021   TRIG 119 12/26/2021    Significant Diagnostic Results in last 30 days:  No results found.  Assessment/Plan 1. Type 2 diabetes mellitus with peripheral vascular disease (HCC) (Primary) A1C Stable  2. Frontotemporal dementia with behavioral disturbance (HCC) SNF level of care On Seroquel  and Depakote   3. Essential hypertension Off All her meds now  4. Acquired hypothyroidism TSH normal in 01/25  5. Depression with anxiety Celexa   6. S/P AKA (above knee amputation), left (HCC) Non Ambulatory  7. History of SIADH On Sodium tabs    Family/ staff Communication:   Labs/tests ordered:

## 2023-03-07 ENCOUNTER — Encounter: Payer: Self-pay | Admitting: Orthopedic Surgery

## 2023-03-07 ENCOUNTER — Non-Acute Institutional Stay (SKILLED_NURSING_FACILITY): Payer: Medicare Other | Admitting: Orthopedic Surgery

## 2023-03-07 DIAGNOSIS — F419 Anxiety disorder, unspecified: Secondary | ICD-10-CM

## 2023-03-07 DIAGNOSIS — L89511 Pressure ulcer of right ankle, stage 1: Secondary | ICD-10-CM

## 2023-03-07 NOTE — Progress Notes (Signed)
Location:  [facility] Room Number: 29/A Place of Service:  SNF [phone]) Provider:  Octavia Heir, NP   Mahlon Gammon, MD  Patient Care Team: Mahlon Gammon, MD as PCP - General (Internal Medicine)  Extended Emergency Contact Information Primary Emergency Contact: [facility] Phone: [phone] Relation: Sister Secondary Emergency Contact: [facility] Phone: [phone] Mobile Phone: [phone] Relation: Brother  Code Status:  DNR Goals of care: Advanced Directive information    02/22/2023    1:30 PM  Advanced Directives  Does Patient Have a Medical Advance Directive? Yes  Type of Estate agent of [place];Living will;Out of facility DNR (pink MOST or yellow form)  Does patient want to make changes to medical advance directive? No - Patient declined  Copy of Healthcare Power of Attorney in Chart? Yes - validated most recent copy scanned in chart (See row information)  Pre-existing out of facility DNR order (yellow form or pink MOST form) Yellow form placed in chart (order not valid for inpatient use)     Chief Complaint  Patient presents with   Acute Visit    Right ankle redness, increased anxiety    HPI:  Pt is a 73 y.o. female seen today for acute visit due right ankle redness and increased anxiety.   She currently resides on the skilled nursing unit at [facility] due to frontotemporal dementia. PMH: HTN, hypothyroidism, T2DM, alcohol abuse, psychosis, wernicke-korsakoff syndrome, thrombocytopenia.   Poor historian due to dementia. Nursing reports increased redness to lateral right ankle. She is nonambulatory. [facility]oyer transfer. H/o left BKA due to limb ischemia.   Diagnosed with frontotemporal dementia. She is followed by hospice. Nursing reports increased tics of fake sneezing x 5 days. Nursing believes behaviors occur when staff or family leave her room. She has been observed with these behaviors in past  and thought to be anxiety. She is currently on Depakote, Seroquel and citalopram.    Past Medical History:  Diagnosis Date   Dementia (HCC)    Dementia in other diseases classified elsewhere, unspecified severity, with other behavioral disturbance (HCC)    Diabetes mellitus without complication (HCC)    Essential (primary) hypertension    per matrix   Hx of BKA, left (HCC) 10/24/2020   Hypo-osmolality and hyponatremia    Hypothyroidism, unspecified    Other frontotemporal neurocognitive disorder (CODE) (HCC)    Pure hypercholesterolemia, unspecified    Thyroid disease    Unspecified dementia, unspecified severity, without behavioral disturbance, psychotic disturbance, mood disturbance, and anxiety (HCC)    per matrix   No past surgical history on file.  No Known Allergies  Outpatient Encounter Medications as of 03/07/2023  Medication Sig   acetaminophen (TYLENOL) 500 MG tablet Take 1,000 mg by mouth 2 (two) times daily as needed for fever (pain).   acetaminophen (TYLENOL) 500 MG tablet Take 500 mg by mouth 2 (two) times daily.   Camphor-Menthol-Methyl Sal (SALONPAS) 3.02-19-08 % PTCH Place 1 patch onto the skin See admin instructions. Apply one patch transdermally to left aka stump daily as needed for pain   citalopram (CELEXA) 10 MG tablet Take 10 mg by mouth every morning.   divalproex (DEPAKOTE SPRINKLE) 125 MG capsule Take 250 mg by mouth 3 (three) times daily.   Docusate Sodium 100 MG capsule Take 100 mg by mouth 2 (two) times daily.   guaiFENesin (ROBITUSSIN) 100 MG/5ML liquid Take 10 mLs by mouth every 4 (four) hours as needed for cough or congestion.  hydrocortisone (AQUANIL HC) 1 % lotion Apply 1 Application topically as needed. Forehead/Neck/Jawline for lesions and dryness.   KETOCONAZOLE, TOPICAL, 1 % SHAM Apply 1 Application topically once a week. Thursday.   lactose free nutrition (BOOST) LIQD Take 237 mLs by mouth 3 (three) times daily. After or between meals for diet  supplement   levothyroxine (SYNTHROID) 50 MCG tablet Take 50 mcg by mouth daily before breakfast.   LORazepam (ATIVAN) 0.5 MG tablet Take 0.5 mg by mouth every 12 (twelve) hours as needed for anxiety.   melatonin 3 MG TABS tablet Take 3 mg by mouth daily.   NYAMYC powder Apply 1 application  topically as needed (groin rash).   nystatin (MYCOSTATIN/NYSTOP) powder Apply 1 Application topically as needed.   polyethylene glycol (MIRALAX / GLYCOLAX) 17 g packet Take 17 g by mouth as needed for mild constipation.   QUEtiapine (SEROQUEL) 25 MG tablet Take 25 mg by mouth every morning.   QUEtiapine (SEROQUEL) 50 MG tablet Take 50 mg by mouth at bedtime.   sodium chloride 1 g tablet Take 1 g by mouth once.   Sodium Fluoride (PREVIDENT 5000 BOOSTER PLUS) 1.1 % PSTE Place onto teeth at bedtime. Brush on teeth with a toothbrush after PM mouth care. Spit out excess and do not rinse   zinc oxide 20 % ointment Apply 1 application  topically as needed (apply to buttocks for redness).   No facility-administered encounter medications on file as of 03/07/2023.    Review of Systems  Unable to perform ROS: Dementia    Immunization History  Administered Date(s) Administered   Fluad Quad(high Dose 65+) 12/07/2021   Influenza, High Dose Seasonal PF 12/13/2022   Influenza-Unspecified 11/11/2019, 11/11/2020   Janssen (J&J) SARS-COV-2 Vaccination 01/13/2022   Moderna Covid-19 Vaccine Bivalent Booster 5yrs & up 12/20/2022   Moderna SARS-COV2 Booster Vaccination 02/02/2021   Moderna Sars-Covid-2 Vaccination 03/06/2019, 04/03/2019, 12/18/2019, 11/11/2020   PPD Test 05/05/2019   Pfizer Covid-19 Vaccine Bivalent Booster 51yrs & up 07/27/2021   Pneumococcal Conjugate-13 12/09/2016   Pneumococcal Polysaccharide-23 12/09/2013   Tdap 03/12/2012   Zoster Recombinant(Shingrix) 11/26/2012, 01/13/2022   Zoster, Live 11/26/2012   Pertinent  Health Maintenance Due  Topic Date Due   OPHTHALMOLOGY EXAM  05/29/2023    HEMOGLOBIN A1C  08/15/2023   FOOT EXAM  12/22/2023   INFLUENZA VACCINE  Completed   MAMMOGRAM  Discontinued   DEXA SCAN  Discontinued   Colonoscopy  Discontinued      01/19/2022   11:42 AM 02/17/2022    1:22 PM 05/22/2022    1:02 PM 12/22/2022    3:01 PM 01/19/2023   12:46 PM  Fall Risk  Falls in the past year? 0 0 0 0 0  Was there an injury with Fall? 0 0 0 0 0  Fall Risk Category Calculator 0 0 0 0 0  Fall Risk Category (Retired) Low Low     (RETIRED) Patient Fall Risk Level Low fall risk Low fall risk     Patient at Risk for Falls Due to No Fall Risks No Fall Risks History of fall(s);Impaired balance/gait;Impaired mobility;Mental status change History of fall(s);Impaired balance/gait;Impaired mobility History of fall(s);Impaired balance/gait;Impaired mobility  Fall risk Follow up Falls evaluation completed Falls evaluation completed Falls evaluation completed;Education provided;Falls prevention discussed Falls evaluation completed;Education provided;Falls prevention discussed Falls evaluation completed;Education provided;Falls prevention discussed   Functional Status Survey:    Vitals:   03/07/23 1307  BP: 122/82  Pulse: 95  Resp: 18  Temp: 97.7 F (  36.5 C)  SpO2: 99%  Weight: 150 lb 8 oz (68.3 kg)  Height: 5\' 5"  (1.651 m)   Body mass index is 25.04 kg/m. Physical Exam Vitals reviewed.  Constitutional:      General: She is not in acute distress. HENT:     Head: Normocephalic.     Mouth/Throat:     Pharynx: No oropharyngeal exudate or posterior oropharyngeal erythema.  Cardiovascular:     Rate and Rhythm: Normal rate and regular rhythm.     Pulses: Normal pulses.     Heart sounds: Normal heart sounds.  Pulmonary:     Effort: Pulmonary effort is normal.     Breath sounds: Normal breath sounds.  Abdominal:     Palpations: Abdomen is soft.  Musculoskeletal:     Cervical back: Neck supple.     Right lower leg: No edema.     Left lower leg: No edema.  Skin:     General: Skin is warm.     Capillary Refill: Capillary refill takes less than 2 seconds.     Comments: Lateral right ankle with increased redness, no skin breakdown, nonblanchable  Neurological:     General: No focal deficit present.     Mental Status: She is alert. Mental status is at baseline.     Motor: Weakness present.     Gait: Gait abnormal.     Comments: Hoyer transfer  Psychiatric:     Comments: Nonverbal today     Labs reviewed: Recent Labs    03/21/22 0000 09/28/22 0000  NA 139 139  K 4.3 4.0  CL 103 104  CO2 28* 25*  BUN 29* 30*  CREATININE 0.6 0.5  CALCIUM 9.6 8.8   Recent Labs    03/21/22 0000  AST 11*  ALT 7  ALKPHOS 42  ALBUMIN 3.9   Recent Labs    03/21/22 0000  WBC 9.2  NEUTROABS 6,182.00  HGB 15.5  HCT 45  PLT 201   Lab Results  Component Value Date   TSH 1.55 02/15/2023   Lab Results  Component Value Date   HGBA1C 7.0 02/15/2023   Lab Results  Component Value Date   CHOL 188 12/26/2021   HDL 60 12/26/2021   LDLCALC 106 12/26/2021   TRIG 119 12/26/2021    Significant Diagnostic Results in last 30 days:  No results found.  Assessment/Plan 1. Pressure injury of right ankle, stage 1 (Primary) - increased redness, nonblanchable to lateral right ankle - non ambulatory - h/o left BKA due to limb ischemia - cont air mattress - start skin prep to right ankle daily - float ankle in bed   2. Anxiety - associated with dementia - increased tics> fake sneezing x 5 days> increased when staff/family leave room - will increase citalopram to 15 mg po daily  - bmp in 2 weeks     Family/ staff Communication: plan discussed with patient and nurse  Labs/tests ordered:  bmp in 2 weeks

## 2023-03-11 IMAGING — DX DG CHEST 1V PORT
1 series · 1 of 1 positions shown · non-contrast
Comparison: 10/15/2020

CLINICAL DATA: Altered level of consciousness, history of dimension

EXAM:
PORTABLE CHEST 1 VIEW

[chest ap]
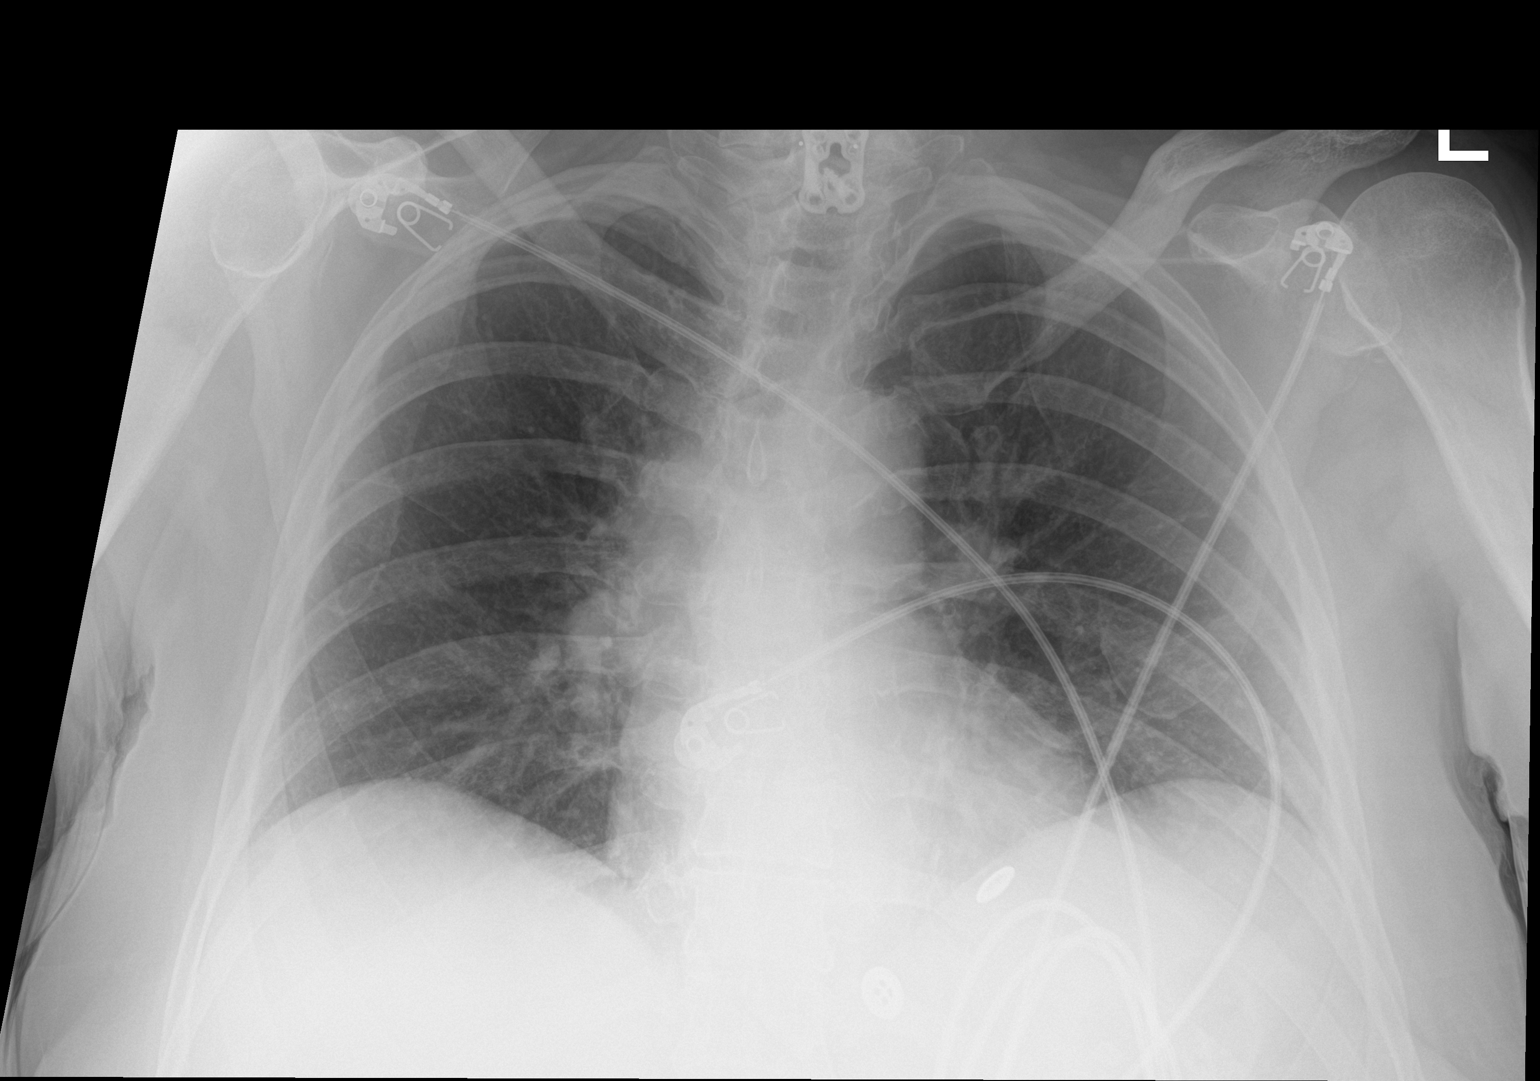

[1 of 1 positions shown; findings below may reference images not displayed]

FINDINGS: Single frontal view of the chest demonstrates a stable cardiac
silhouette. No airspace disease, effusion, or pneumothorax. No acute
bony abnormalities.
IMPRESSION: 1. Stable chest, no acute process.

## 2023-03-11 IMAGING — CT CT ABD-PELV W/ CM
2 of 5 series · 17 of 46 positions shown, 19 images · IV contrast (omnipaque)
Comparison: None.

CLINICAL DATA: Right lower quadrant pain

EXAM:
CT ABDOMEN AND PELVIS WITH CONTRAST
TECHNIQUE: Multidetector CT imaging of the abdomen and pelvis was performed
using the standard protocol following bolus administration of
intravenous contrast.
CONTRAST:  80mL OMNIPAQUE IOHEXOL 350 MG/ML SOLN

[Series 2: axial st · axial · 0.81mm/px · z∈[-471,-61]mm · 14 of 96 slices shown, 16 images]
[im 7/96  soft-tissue]
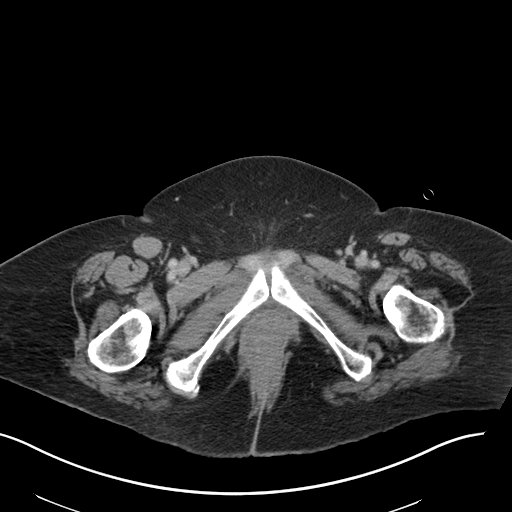
[im 7/96  bone]
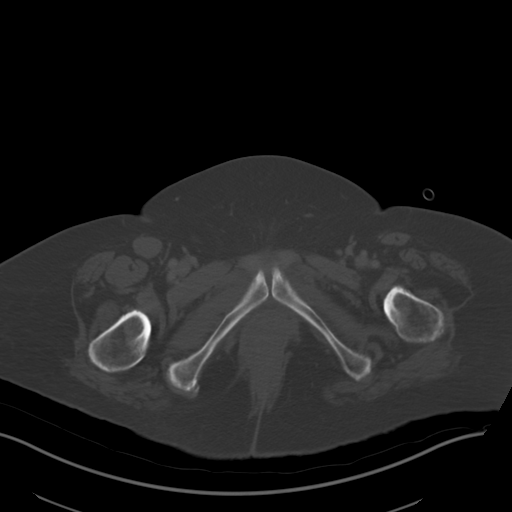
[im 13/96  soft-tissue]
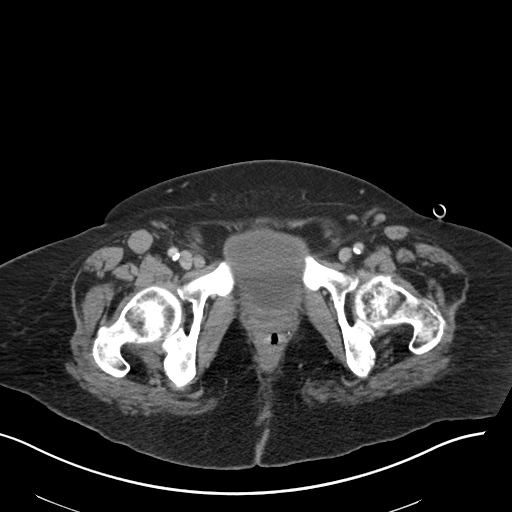
[im 20/96  soft-tissue]
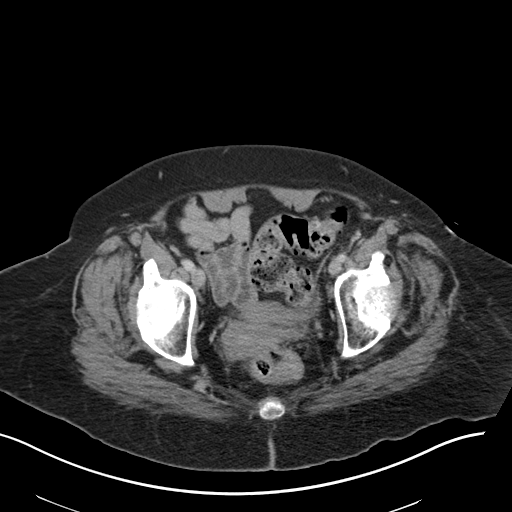
[im 26/96  soft-tissue]
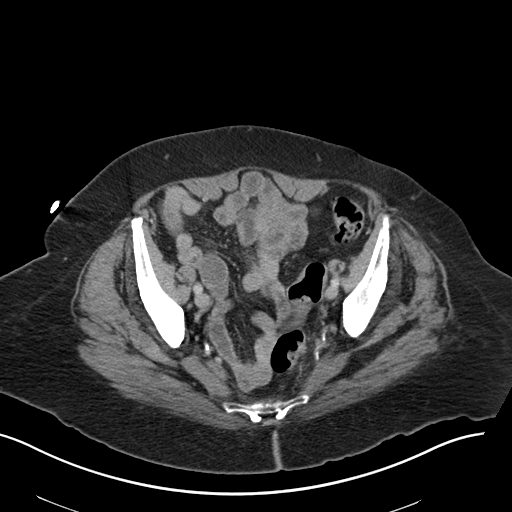
[im 32/96  soft-tissue]
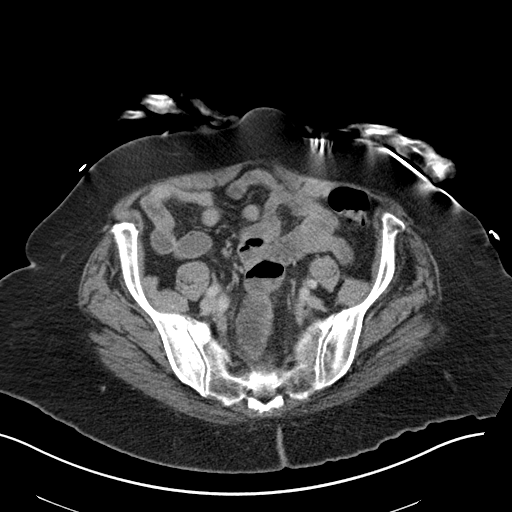
[im 39/96  soft-tissue]
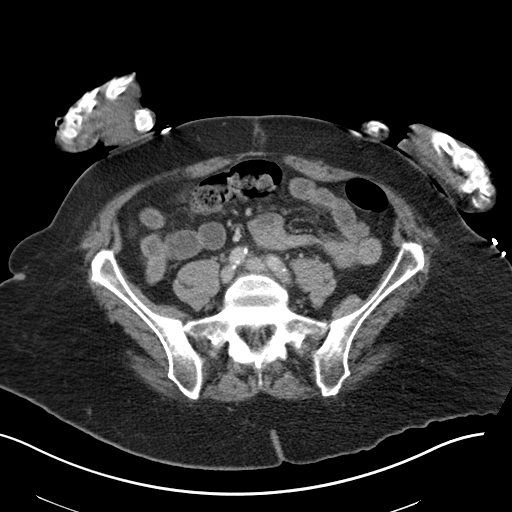
[im 45/96  soft-tissue]
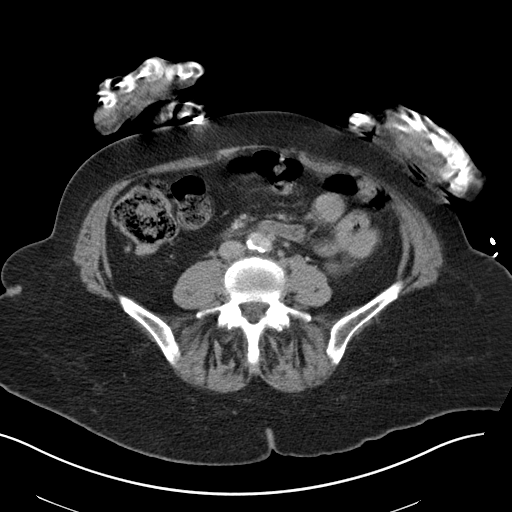
[im 51/96  soft-tissue]
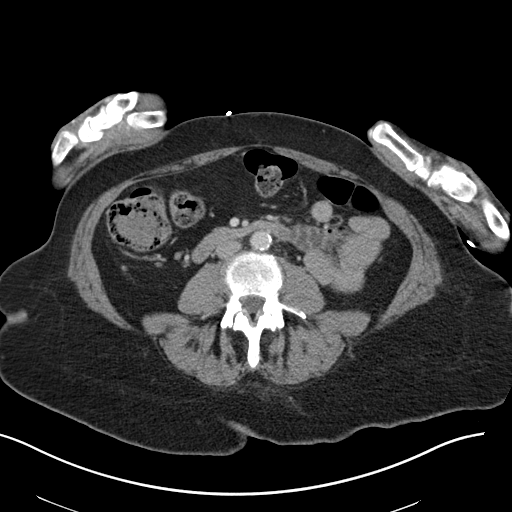
[im 58/96  soft-tissue]
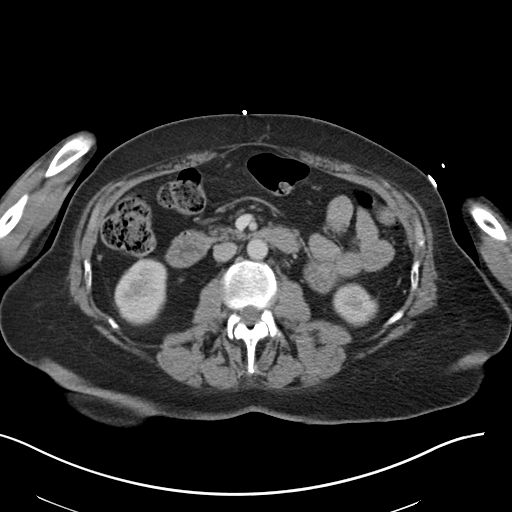
[im 58/96  bone]
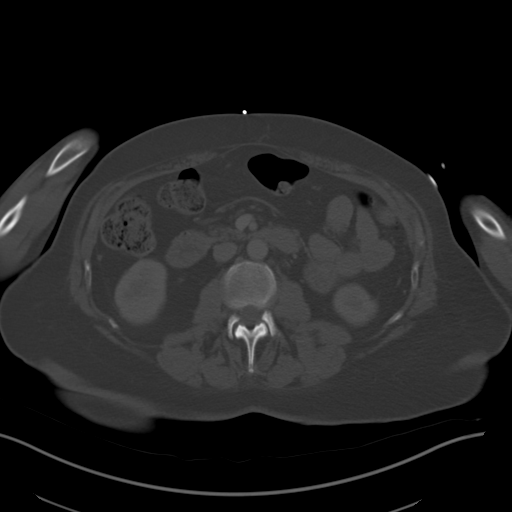
[im 64/96  soft-tissue]
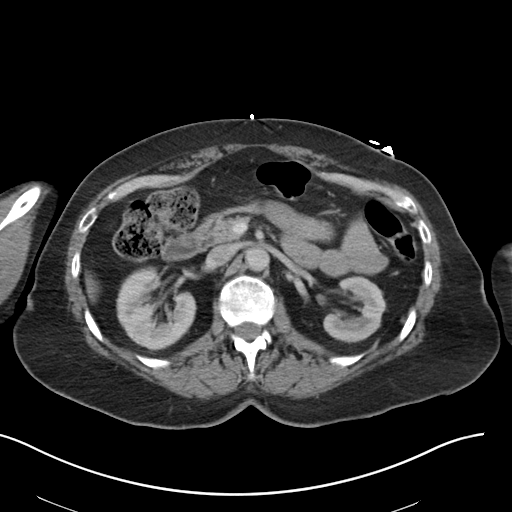
[im 70/96  soft-tissue]
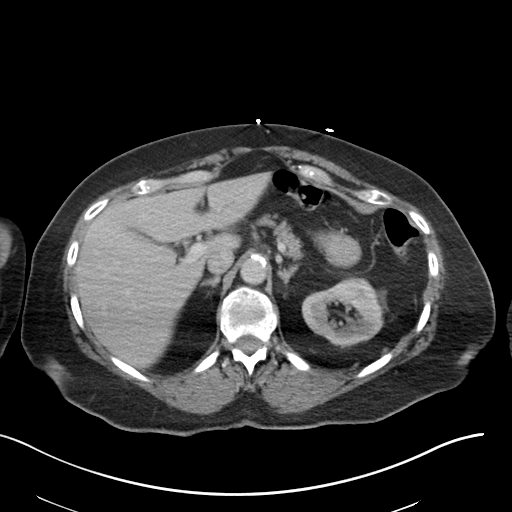
[im 77/96  soft-tissue]
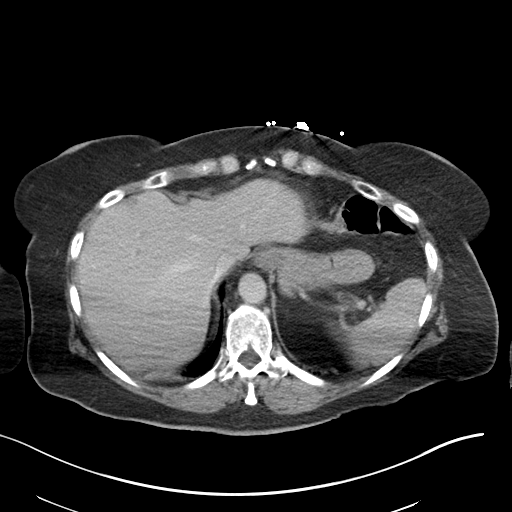
[im 83/96  soft-tissue]
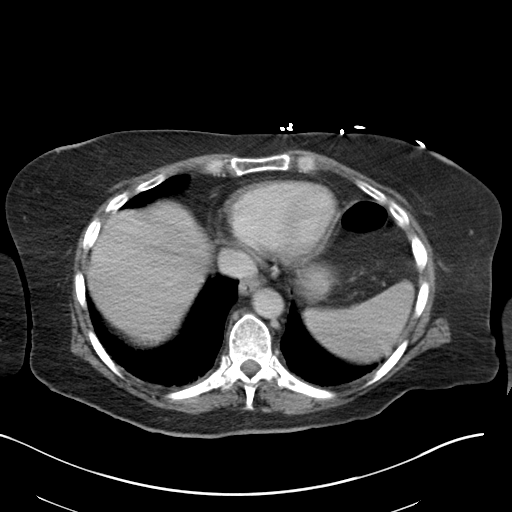
[im 89/96  soft-tissue]
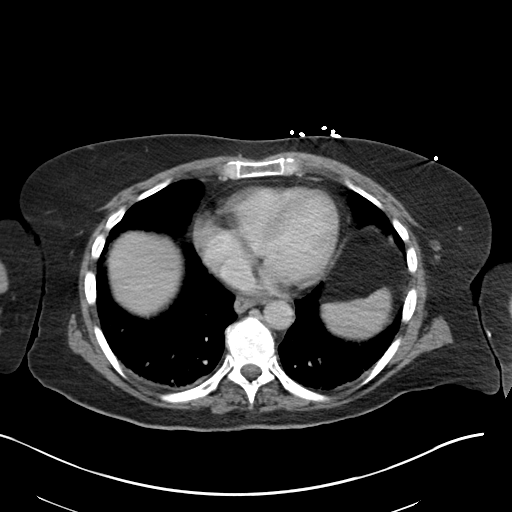

[Series 5: coronal st · coronal · 0.88mm/px · 3 of 160 slices shown]
[im 54/160  soft-tissue]
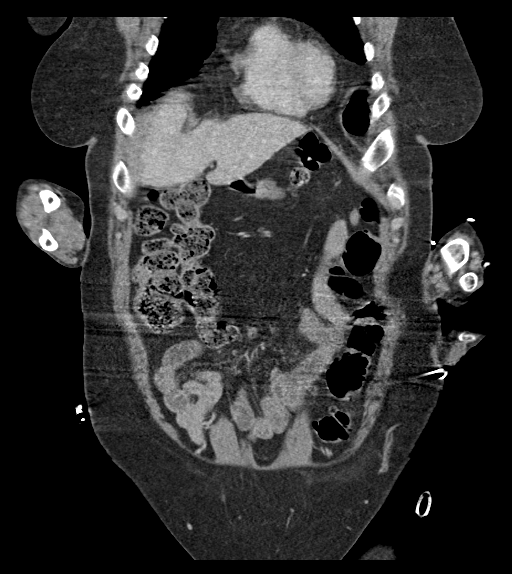
[im 71/160  soft-tissue]
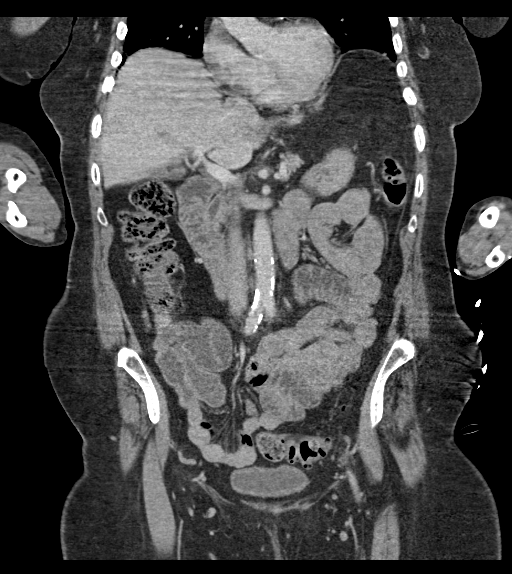
[im 89/160  soft-tissue]
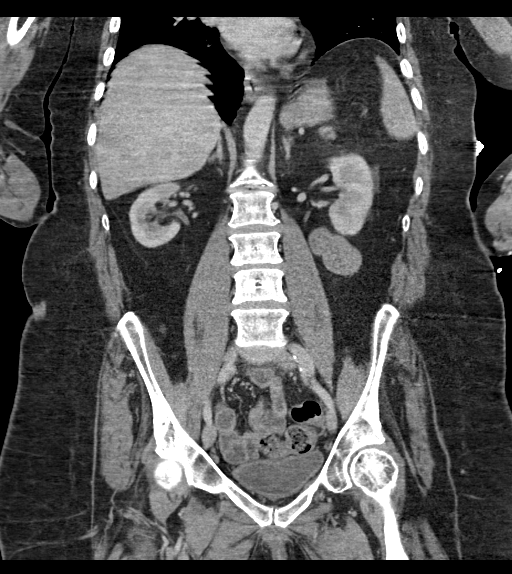

[17 of 46 positions shown; findings below may reference images not displayed]

FINDINGS: Lower chest: Lung bases demonstrate mild sub pleural reticulation.
No acute consolidation or effusion. Coronary vascular calcification.

Hepatobiliary: No focal liver abnormality is seen. No gallstones,
gallbladder wall thickening, or biliary dilatation.

Pancreas: Unremarkable. No pancreatic ductal dilatation or
surrounding inflammatory changes.

Spleen: Normal in size without focal abnormality.

Adrenals/Urinary Tract: Adrenal glands are normal. Kidneys show no
hydronephrosis. Cyst in the midpole right kidney. The bladder is
unremarkable.

Stomach/Bowel: Stomach is within normal limits. Appendix appears
normal. No evidence of bowel wall thickening, distention, or
inflammatory changes.

Vascular/Lymphatic: Moderate aortic atherosclerosis. No aneurysm. No
suspicious nodes.

Reproductive: Uterus and bilateral adnexa are unremarkable.

Other: Negative for free air or free fluid

Musculoskeletal: No acute osseous abnormality
IMPRESSION: No CT evidence for acute intra-abdominopelvic abnormality

## 2023-03-22 DIAGNOSIS — I10 Essential (primary) hypertension: Secondary | ICD-10-CM | POA: Diagnosis not present

## 2023-03-23 ENCOUNTER — Non-Acute Institutional Stay (SKILLED_NURSING_FACILITY): Payer: Medicare Other | Admitting: Orthopedic Surgery

## 2023-03-23 ENCOUNTER — Encounter: Payer: Self-pay | Admitting: Orthopedic Surgery

## 2023-03-23 DIAGNOSIS — F339 Major depressive disorder, recurrent, unspecified: Secondary | ICD-10-CM

## 2023-03-23 DIAGNOSIS — F04 Amnestic disorder due to known physiological condition: Secondary | ICD-10-CM

## 2023-03-23 DIAGNOSIS — E1151 Type 2 diabetes mellitus with diabetic peripheral angiopathy without gangrene: Secondary | ICD-10-CM

## 2023-03-23 DIAGNOSIS — E871 Hypo-osmolality and hyponatremia: Secondary | ICD-10-CM

## 2023-03-23 DIAGNOSIS — G3109 Other frontotemporal dementia: Secondary | ICD-10-CM | POA: Diagnosis not present

## 2023-03-23 DIAGNOSIS — E039 Hypothyroidism, unspecified: Secondary | ICD-10-CM

## 2023-03-23 DIAGNOSIS — Z89612 Acquired absence of left leg above knee: Secondary | ICD-10-CM | POA: Diagnosis not present

## 2023-03-23 DIAGNOSIS — I10 Essential (primary) hypertension: Secondary | ICD-10-CM

## 2023-03-23 DIAGNOSIS — F419 Anxiety disorder, unspecified: Secondary | ICD-10-CM

## 2023-03-23 DIAGNOSIS — F02818 Dementia in other diseases classified elsewhere, unspecified severity, with other behavioral disturbance: Secondary | ICD-10-CM

## 2023-03-23 NOTE — Progress Notes (Signed)
 Subjective:    Patient ID: Alexis Becker, female    DOB: 29-Dec-1950, 73 y.o.   MRN: [id number]  HPI Alexis Becker is a 73 year old female who was seen today for medical management of chronic conditions.   The patient resides on the skilled nursing unit at [facility] due to frontotemporal dementia. The patient is followed by Hospice care. She is a retired engineer, civil (consulting). 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   Frontotemporal Dementia   -Followed by Hospice -CT head performed on (04/06/2020) showed severe brain atrophy, especially in the frontal and temporal lobes and worse on the right. Remote lacunar infarct at the right caudate head. -MRI 2019, remote infarcts right anterior temporal lobe and right periventricular basal ganglia, marked severe cerebral atrophy temporal parietal lobes -Nonverbal   -Requires total care with ADL's including feeding -Behavioral tics like sneezing have been noted by staff r/t anxiety -Remains on Seroquel  and Depakote   -Weight is managed: 03/19/2023: 150 lbs 02/15/2023: 150 lbs 01/31/2023: 144 lbs  2. Hypertension  Recent Blood pressures:  03/20/2023: 135/75 03/13/2023: 131/80 03/06/2023: 122/82  -controlled without medication  3. Type II Diabetes   - Hemoglobin A1C 7.4 (09/28/2022) < now 7.0 (02/23/2023) -Improving without medication  4. Hypothyroidism  - TSH 3.19 on (12/26/2021) < now 1.55 on (02/15/2023) -Improving on Levothyroxine    5. Hyponatremia   -Na+ was 139  (09/28/2022), Na+ was 139 on (03/21/2022) -Sodium in range -Remains on sodium tablets  6.  Left AKA d/t Limb ischemia   -No signs of infection noted on (03/23/2023) -Ambulates with wheelchair and hoyer lift  7. Stage 1 pressure injury of Right ankle  - patient is bed bound -No erythema preset to lateral right ankle -Right ankle  elevated in bed -Uses an air mattress         Past Medical History:  Diagnosis Date   Dementia (HCC)    Dementia in other diseases classified elsewhere, unspecified severity, with other behavioral disturbance (HCC)    Diabetes mellitus without complication (HCC)    Essential (primary) hypertension    per matrix   Hx of BKA, left (HCC) 10/24/2020   Hypo-osmolality and hyponatremia    Hypothyroidism, unspecified    Other frontotemporal neurocognitive disorder (CODE) (HCC)    Pure hypercholesterolemia, unspecified    Thyroid  disease    Unspecified dementia, unspecified severity, without behavioral disturbance, psychotic disturbance, mood disturbance, and anxiety (HCC)    per matrix  No past surgical history on file. Allergies as of 03/23/2023   No Known Allergies      Medication List        Accurate as of March 23, 2023  1:16 PM. If you have any questions, ask your nurse or doctor.          acetaminophen  500 MG tablet Commonly known as: TYLENOL  Take 1,000 mg by mouth 2 (two) times daily as needed for fever (pain).   acetaminophen  500 MG tablet Commonly known as: TYLENOL  Take 500 mg by mouth 2 (two) times daily.   Aquanil HC 1 % lotion Generic drug: hydrocortisone  Apply 1 Application topically as needed. Forehead/Neck/Jawline for lesions and dryness.   citalopram  10 MG tablet Commonly known as: CELEXA  Take 15 mg by mouth every morning.   divalproex 125 MG capsule Commonly known as: DEPAKOTE SPRINKLE Take 250 mg by mouth 3 (three) times daily.   Docusate Sodium  100 MG  capsule Take 100 mg by mouth 2 (two) times daily.   guaiFENesin 100 MG/5ML liquid Commonly known as: ROBITUSSIN Take 10 mLs by mouth every 4 (four) hours as needed for cough or congestion.   KETOCONAZOLE  (TOPICAL) 1 % Sham Apply 1 Application topically once a week. Thursday.   lactose free nutrition Liqd Take 237 mLs by mouth 3 (three) times daily. After or between meals for diet  supplement   levothyroxine  50 MCG tablet Commonly known as: SYNTHROID  Take 50 mcg by mouth daily before breakfast.   LORazepam  0.5 MG tablet Commonly known as: ATIVAN  Take 0.5 mg by mouth every 12 (twelve) hours as needed for anxiety.   melatonin 3 MG Tabs tablet Take 3 mg by mouth daily.   nystatin  powder Commonly known as: MYCOSTATIN /NYSTOP  Apply 1 Application topically as needed.   Nyamyc  powder Generic drug: nystatin  Apply 1 application  topically as needed (groin rash).   polyethylene glycol 17 g packet Commonly known as: MIRALAX  / GLYCOLAX  Take 17 g by mouth as needed for mild constipation.   PreviDent 5000 Booster Plus 1.1 % Pste Generic drug: Sodium Fluoride Place onto teeth at bedtime. Brush on teeth with a toothbrush after PM mouth care. Spit out excess and do not rinse   QUEtiapine  50 MG tablet Commonly known as: SEROQUEL  Take 50 mg by mouth at bedtime.   QUEtiapine  25 MG tablet Commonly known as: SEROQUEL  Take 25 mg by mouth every morning.   Salonpas 3.02-19-08 % Ptch Generic drug: Camphor-Menthol-Methyl Sal Place 1 patch onto the skin See admin instructions. Apply one patch transdermally to left aka stump daily as needed for pain   sodium chloride  1 g tablet Take 1 g by mouth once.   zinc  oxide 20 % ointment Apply 1 application  topically as needed (apply to buttocks for redness).       Unable to complete ROS due to dementia. The patient is nonverbal and does not follow commands.     Review of Systems  Unable to perform ROS: Dementia       Objective:   Physical Exam HENT:     Head: Normocephalic.     Right Ear: External ear normal.     Left Ear: External ear normal.     Nose: Nose normal.     Mouth/Throat:     Mouth: Mucous membranes are dry.  Cardiovascular:     Rate and Rhythm: Normal rate and regular rhythm.     Pulses:          Radial pulses are 1+ on the right side and 1+ on the left side.       Dorsalis pedis pulses are 1+ on the  right side. Left dorsalis pedis pulse not accessible.     Heart sounds: Normal heart sounds, S1 normal and S2 normal.  Pulmonary:     Effort: Pulmonary effort is normal.     Breath sounds: Normal breath sounds.     Comments: Clear AP lung sounds auscultated. No audible wheezing or adventitious lung sounds were heard.  Abdominal:     General: Abdomen is flat. Bowel sounds are normal.  Musculoskeletal:     Cervical back: Neck supple.     Left Lower Extremity: Left leg is amputated below knee.  Feet:     Right foot:     Skin integrity: Skin integrity normal.     Left foot:     Skin integrity: Skin integrity normal.  Skin:    General: Skin is warm.  Capillary Refill: Capillary refill takes 2 to 3 seconds. Capillary refill 2-3 seconds in bilateral upper extremities.  Capillary refill <2 second in the  right foot.  Neurological:     Mental Status: She is alert. Mental status is at baseline.     Motor: Weakness present.     Gait: Gait abnormal.           Assessment & Plan:   Frontotemporal Dementia   -continue Depakote and Seroquel  -Dependent with ADL's and feeding -Continue Citalopram  for management of tic    2. Hypertension  -continue to monitor  -controlled without medication  3. Type II Diabetes   -controlled without medication  -No hypoglycemic episodes reported by staff    4. Hypothyroidism  -Continue Levothyroxine    5. Hyponatremia   -Na+ within normal limits -Continue sodium tablets   6. Left AKA d/t Limb ischemia   - Continue to monitor for skin breakdown and signs of infection -Continue skilled nursing level of care  7. Stage 1 pressure injury of Right ankle  -Keep right ankle elevated in bed -Continue to use air mattress -Continue to monitor for worsening injury and/or infection

## 2023-03-23 NOTE — Progress Notes (Signed)
 Location:  [facility] Room Number: 29/A Place of Service:  SNF [phone]) Provider:  Greig FORBES Cluster, NP   Charlanne Fredia CROME, MD  Patient Care Team: Charlanne Fredia CROME, MD as PCP - General (Internal Medicine)  Extended Emergency Contact Information Primary Emergency Contact: [facility] Phone: [phone] Relation: Sister Secondary Emergency Contact: [facility] Phone: [phone] Mobile Phone: [phone] Relation: Brother  Code Status:  DNR Goals of care: Advanced Directive information    02/22/2023    1:30 PM  Advanced Directives  Does Patient Have a Medical Advance Directive? Yes  Type of Estate Agent of [place];Living will;Out of facility DNR (pink MOST or yellow form)  Does patient want to make changes to medical advance directive? No - Patient declined  Copy of Healthcare Power of Attorney in Chart? Yes - validated most recent copy scanned in chart (See row information)  Pre-existing out of facility DNR order (yellow form or pink MOST form) Yellow form placed in chart (order not valid for inpatient use)     Chief Complaint  Patient presents with   Medical Management of Chronic Issues    HPI:  Pt is a 73 y.o. female seen today for medical management of chronic diseases.    She currently resides on the skilled nursing unit at [facility] due to frontotemporal dementia. PMH: HTN, hypothyroidism, T2DM, alcohol abuse, psychosis, wernicke-korsakoff syndrome, thrombocytopenia.      T2DM- A1c 7.0 (01/03)> was 7.4 (08/15), sugars 110-150's, no  hypoglycemia, diet controlled, eye exam 05/29/2022, not on medication Frontotemporal dementia- followed by Hospice, MRI 2019 remote infarcts right anterior temporal lobe and right periventricular basal ganglia, marked severe cerebral atrophy temporal parietal lobes,dependent with all ADLs including feeding, nonverbal, tics of fake sneezing occur when she wants staff per nursing,  remains on Seroquel  and Depakote Left BKA- skin tear to stump on exam, due to acute limb ischemia, ambulates with wheelchair/hoyer HTN- BUN/creat 14/0.54 03/22/2023, not on medication Hypothyroidism- TSH 1.55 02/16/2023, remains on levothyroxine  Hyponatremia- Na 136 03/22/2023, remains on sodium tablets Depression and anxiety- remains on Celexa  and lorazepam  prn  Recent weighs:  02/03- 150 lbs   01/02- 150 lbs  12/01- 144 lbs   Recent blood pressures:  02/04- 135/75  01/28- 131/80  01/21- 122/82    Past Medical History:  Diagnosis Date   Dementia (HCC)    Dementia in other diseases classified elsewhere, unspecified severity, with other behavioral disturbance (HCC)    Diabetes mellitus without complication (HCC)    Essential (primary) hypertension    per matrix   Hx of BKA, left (HCC) 10/24/2020   Hypo-osmolality and hyponatremia    Hypothyroidism, unspecified    Other frontotemporal neurocognitive disorder (CODE) (HCC)    Pure hypercholesterolemia, unspecified    Thyroid  disease    Unspecified dementia, unspecified severity, without behavioral disturbance, psychotic disturbance, mood disturbance, and anxiety (HCC)    per matrix   No past surgical history on file.  No Known Allergies  Outpatient Encounter Medications as of 03/23/2023  Medication Sig   acetaminophen  (TYLENOL ) 500 MG tablet Take 1,000 mg by mouth 2 (two) times daily as needed for fever (pain).   acetaminophen  (TYLENOL ) 500 MG tablet Take 500 mg by mouth 2 (two) times daily.   Camphor-Menthol-Methyl Sal (SALONPAS) 3.02-19-08 % PTCH Place 1 patch onto the skin See admin instructions. Apply one patch transdermally to left aka stump daily as needed for pain   citalopram  (CELEXA ) 10 MG tablet Take 15 mg  by mouth every morning.   divalproex (DEPAKOTE SPRINKLE) 125 MG capsule Take 250 mg by mouth 3 (three) times daily.   Docusate Sodium  100 MG capsule Take 100 mg by mouth 2 (two) times daily.   guaiFENesin  (ROBITUSSIN) 100 MG/5ML liquid Take 10 mLs by mouth every 4 (four) hours as needed for cough or congestion.   hydrocortisone  (AQUANIL HC) 1 % lotion Apply 1 Application topically as needed. Forehead/Neck/Jawline for lesions and dryness.   KETOCONAZOLE , TOPICAL, 1 % SHAM Apply 1 Application topically once a week. Thursday.   lactose free nutrition (BOOST) LIQD Take 237 mLs by mouth 3 (three) times daily. After or between meals for diet supplement   levothyroxine  (SYNTHROID ) 50 MCG tablet Take 50 mcg by mouth daily before breakfast.   LORazepam  (ATIVAN ) 0.5 MG tablet Take 0.5 mg by mouth every 12 (twelve) hours as needed for anxiety.   melatonin 3 MG TABS tablet Take 3 mg by mouth daily.   NYAMYC  powder Apply 1 application  topically as needed (groin rash).   nystatin  (MYCOSTATIN /NYSTOP ) powder Apply 1 Application topically as needed.   polyethylene glycol (MIRALAX  / GLYCOLAX ) 17 g packet Take 17 g by mouth as needed for mild constipation.   QUEtiapine  (SEROQUEL ) 25 MG tablet Take 25 mg by mouth every morning.   QUEtiapine  (SEROQUEL ) 50 MG tablet Take 50 mg by mouth at bedtime.   sodium chloride  1 g tablet Take 1 g by mouth once.   Sodium Fluoride (PREVIDENT 5000 BOOSTER PLUS) 1.1 % PSTE Place onto teeth at bedtime. Brush on teeth with a toothbrush after PM mouth care. Spit out excess and do not rinse   zinc  oxide 20 % ointment Apply 1 application  topically as needed (apply to buttocks for redness).   No facility-administered encounter medications on file as of 03/23/2023.    Review of Systems  Unable to perform ROS: Patient nonverbal    Immunization History  Administered Date(s) Administered   Fluad Quad(high Dose 65+) 12/07/2021   Influenza, High Dose Seasonal PF 12/13/2022   Influenza-Unspecified 11/11/2019, 11/11/2020   Janssen (J&J) SARS-COV-2 Vaccination 01/13/2022   Moderna Covid-19 Vaccine Bivalent Booster 67yrs & up 12/20/2022   Moderna SARS-COV2 Booster Vaccination 02/02/2021    Moderna Sars-Covid-2 Vaccination 03/06/2019, 04/03/2019, 12/18/2019, 11/11/2020   PPD Test 05/05/2019   Pfizer Covid-19 Vaccine Bivalent Booster 36yrs & up 07/27/2021   Pneumococcal Conjugate-13 12/09/2016   Pneumococcal Polysaccharide-23 12/09/2013   Tdap 03/12/2012   Zoster Recombinant(Shingrix) 11/26/2012, 01/13/2022   Zoster, Live 11/26/2012   Pertinent  Health Maintenance Due  Topic Date Due   OPHTHALMOLOGY EXAM  05/29/2023   HEMOGLOBIN A1C  08/15/2023   FOOT EXAM  12/22/2023   INFLUENZA VACCINE  Completed   MAMMOGRAM  Discontinued   DEXA SCAN  Discontinued   Colonoscopy  Discontinued      01/19/2022   11:42 AM 02/17/2022    1:22 PM 05/22/2022    1:02 PM 12/22/2022    3:01 PM 01/19/2023   12:46 PM  Fall Risk  Falls in the past year? 0 0 0 0 0  Was there an injury with Fall? 0 0 0 0 0  Fall Risk Category Calculator 0 0 0 0 0  Fall Risk Category (Retired) Low Low     (RETIRED) Patient Fall Risk Level Low fall risk Low fall risk     Patient at Risk for Falls Due to No Fall Risks No Fall Risks History of fall(s);Impaired balance/gait;Impaired mobility;Mental status change History of fall(s);Impaired  balance/gait;Impaired mobility History of fall(s);Impaired balance/gait;Impaired mobility  Fall risk Follow up Falls evaluation completed Falls evaluation completed Falls evaluation completed;Education provided;Falls prevention discussed Falls evaluation completed;Education provided;Falls prevention discussed Falls evaluation completed;Education provided;Falls prevention discussed   Functional Status Survey:    Vitals:   03/23/23 1006  BP: 135/75  Pulse: 76  Resp: 17  Temp: (!) 97.4 F (36.3 C)  SpO2: 97%  Weight: 150 lb (68 kg)  Height: 5' 5 (1.651 m)   Body mass index is 24.96 kg/m. Physical Exam Vitals reviewed.  Constitutional:      General: She is not in acute distress. HENT:     Head: Normocephalic.     Right Ear: There is no impacted cerumen.     Left Ear: There  is no impacted cerumen.     Nose: Nose normal.     Mouth/Throat:     Mouth: Mucous membranes are moist.  Eyes:     General:        Right eye: No discharge.        Left eye: No discharge.  Cardiovascular:     Rate and Rhythm: Normal rate and regular rhythm.     Pulses: Normal pulses.     Heart sounds: Normal heart sounds.  Pulmonary:     Effort: Pulmonary effort is normal.     Breath sounds: Normal breath sounds.  Abdominal:     General: Bowel sounds are normal.     Palpations: Abdomen is soft.  Musculoskeletal:     Cervical back: Neck supple.     Right lower leg: No edema.  Skin:    General: Skin is warm.     Capillary Refill: Capillary refill takes less than 2 seconds.  Neurological:     General: No focal deficit present.     Mental Status: She is alert. Mental status is at baseline.     Motor: Weakness present.     Gait: Gait abnormal.  Psychiatric:        Mood and Affect: Mood normal.     Comments: Non verbal, does not follow commands     Labs reviewed: Recent Labs    09/28/22 0000  NA 139  K 4.0  CL 104  CO2 25*  BUN 30*  CREATININE 0.5  CALCIUM 8.8   No results for input(s): AST, ALT, ALKPHOS, BILITOT, PROT, ALBUMIN in the last 8760 hours. No results for input(s): WBC, NEUTROABS, HGB, HCT, MCV, PLT in the last 8760 hours. Lab Results  Component Value Date   TSH 1.55 02/15/2023   Lab Results  Component Value Date   HGBA1C 7.0 02/15/2023   Lab Results  Component Value Date   CHOL 188 12/26/2021   HDL 60 12/26/2021   LDLCALC 106 12/26/2021   TRIG 119 12/26/2021    Significant Diagnostic Results in last 30 days:  No results found.  Assessment/Plan 1. Type 2 diabetes mellitus with peripheral vascular disease (HCC) (Primary) - A1c stable - no hypoglycemia - not on medication  2. Frontotemporal dementia with behavioral disturbance (HCC) - followed by hospice - dependent with all ADLs including feeding - weight  stable - cont Seroquel  and Depakote  3. S/P AKA (above knee amputation), left (HCC) - continue skilled nursing  4. Essential hypertension - controlled without medication  5. Hyponatremia - Na+ sable - cont sodium tablets  6. Acquired hypothyroidism - TSH stable - cont levothyroxine   7. Depression, recurrent (HCC) - no mood changes - cont Celexa   8. Anxiety -  cont lorazepam  prn  9. Wernicke-Korsakoff syndrome (HCC)     Family/ staff Communication: plan discussed with patient and nurse  Labs/tests ordered:  none

## 2023-04-16 ENCOUNTER — Other Ambulatory Visit: Payer: Self-pay | Admitting: Orthopedic Surgery

## 2023-04-16 DIAGNOSIS — G3109 Other frontotemporal dementia: Secondary | ICD-10-CM

## 2023-04-16 DIAGNOSIS — F419 Anxiety disorder, unspecified: Secondary | ICD-10-CM

## 2023-04-16 MED ORDER — LORAZEPAM 0.5 MG PO TABS
0.5000 mg | ORAL_TABLET | Freq: Two times a day (BID) | ORAL | 0 refills | Status: DC | PRN
Start: 2023-04-16 — End: 2023-07-12

## 2023-04-27 ENCOUNTER — Non-Acute Institutional Stay (SKILLED_NURSING_FACILITY): Payer: Self-pay | Admitting: Orthopedic Surgery

## 2023-04-27 ENCOUNTER — Encounter: Payer: Self-pay | Admitting: Orthopedic Surgery

## 2023-04-27 DIAGNOSIS — Z89512 Acquired absence of left leg below knee: Secondary | ICD-10-CM | POA: Diagnosis not present

## 2023-04-27 DIAGNOSIS — G3109 Other frontotemporal dementia: Secondary | ICD-10-CM | POA: Diagnosis not present

## 2023-04-27 DIAGNOSIS — E039 Hypothyroidism, unspecified: Secondary | ICD-10-CM

## 2023-04-27 DIAGNOSIS — F02818 Dementia in other diseases classified elsewhere, unspecified severity, with other behavioral disturbance: Secondary | ICD-10-CM

## 2023-04-27 DIAGNOSIS — F419 Anxiety disorder, unspecified: Secondary | ICD-10-CM

## 2023-04-27 DIAGNOSIS — E871 Hypo-osmolality and hyponatremia: Secondary | ICD-10-CM

## 2023-04-27 DIAGNOSIS — F339 Major depressive disorder, recurrent, unspecified: Secondary | ICD-10-CM

## 2023-04-27 DIAGNOSIS — I10 Essential (primary) hypertension: Secondary | ICD-10-CM

## 2023-04-27 DIAGNOSIS — E1151 Type 2 diabetes mellitus with diabetic peripheral angiopathy without gangrene: Secondary | ICD-10-CM

## 2023-04-27 NOTE — Progress Notes (Signed)
 Location:  [facility] Room Number: 29/A Place of Service:  SNF [phone]) Provider:  Octavia Heir, NP   Mahlon Gammon, MD  Patient Care Team: Mahlon Gammon, MD as PCP - General (Internal Medicine)  Extended Emergency Contact Information Primary Emergency Contact: [facility] Phone: [phone] Relation: Sister Secondary Emergency Contact: [facility] Phone: [phone] Mobile Phone: [phone] Relation: Brother  Code Status:  DNR Goals of care: Advanced Directive information    02/22/2023    1:30 PM  Advanced Directives  Does Patient Have a Medical Advance Directive? Yes  Type of Estate agent of [place];Living will;Out of facility DNR (pink MOST or yellow form)  Does patient want to make changes to medical advance directive? No - Patient declined  Copy of Healthcare Power of Attorney in Chart? Yes - validated most recent copy scanned in chart (See row information)  Pre-existing out of facility DNR order (yellow form or pink MOST form) Yellow form placed in chart (order not valid for inpatient use)     Chief Complaint  Patient presents with   Medical Management of Chronic Issues    HPI:  Pt is a 73 y.o. female seen today for medical management of chronic diseases.    She currently resides on the skilled nursing unit at [facility] due to frontotemporal dementia. PMH: HTN, hypothyroidism, T2DM, alcohol abuse, psychosis, wernicke-korsakoff syndrome, thrombocytopenia.      T2DM- A1c 7.0 (01/03)> was 7.4 (08/15), sugars 120-170's, no  hypoglycemia, diet controlled, eye exam 05/29/2022, not on medication Frontotemporal dementia- followed by Hospice, MRI 2019 remote infarcts right anterior temporal lobe and right periventricular basal ganglia, marked severe cerebral atrophy temporal parietal lobes,dependent with all ADLs including feeding, nonverbal, tics of fake sneezing occur when she wants staff per nursing,  remains on Seroquel and Depakote Left BKA- h/o  acute limb ischemia, ambulates with wheelchair/hoyer HTN- BUN/creat 14/0.54 03/22/2023, not on medication Hypothyroidism- TSH 1.55 02/16/2023, remains on levothyroxine Hyponatremia- Na 136 03/22/2023, remains on sodium tablets Depression and anxiety- remains on Celexa and lorazepam prn  Recent blood pressures:  03/11- 134/78  03/04- 133/82  02/25- 138/74  Recent weights:  03/02- 148 lbs  02/03- 150 lbs  01/02- 150.2 lbs    Past Medical History:  Diagnosis Date   Dementia (HCC)    Dementia in other diseases classified elsewhere, unspecified severity, with other behavioral disturbance (HCC)    Diabetes mellitus without complication (HCC)    Essential (primary) hypertension    per matrix   Hx of BKA, left (HCC) 10/24/2020   Hypo-osmolality and hyponatremia    Hypothyroidism, unspecified    Other frontotemporal neurocognitive disorder (CODE) (HCC)    Pure hypercholesterolemia, unspecified    Thyroid disease    Unspecified dementia, unspecified severity, without behavioral disturbance, psychotic disturbance, mood disturbance, and anxiety (HCC)    per matrix   No past surgical history on file.  No Known Allergies  Outpatient Encounter Medications as of 04/27/2023  Medication Sig   acetaminophen (TYLENOL) 500 MG tablet Take 1,000 mg by mouth 2 (two) times daily as needed for fever (pain).   acetaminophen (TYLENOL) 500 MG tablet Take 500 mg by mouth 2 (two) times daily.   Camphor-Menthol-Methyl Sal (SALONPAS) 3.02-19-08 % PTCH Place 1 patch onto the skin See admin instructions. Apply one patch transdermally to left aka stump daily as needed for pain   citalopram (CELEXA) 10 MG tablet Take 15 mg by mouth every morning.   divalproex (DEPAKOTE  SPRINKLE) 125 MG capsule Take 250 mg by mouth 3 (three) times daily.   Docusate Sodium 100 MG capsule Take 100 mg by mouth 2 (two) times daily.   guaiFENesin (ROBITUSSIN) 100 MG/5ML liquid Take 10  mLs by mouth every 4 (four) hours as needed for cough or congestion.   hydrocortisone (AQUANIL HC) 1 % lotion Apply 1 Application topically as needed. Forehead/Neck/Jawline for lesions and dryness.   KETOCONAZOLE, TOPICAL, 1 % SHAM Apply 1 Application topically once a week. Thursday.   lactose free nutrition (BOOST) LIQD Take 237 mLs by mouth 3 (three) times daily. After or between meals for diet supplement   levothyroxine (SYNTHROID) 50 MCG tablet Take 50 mcg by mouth daily before breakfast.   LORazepam (ATIVAN) 0.5 MG tablet Take 1 tablet (0.5 mg total) by mouth every 12 (twelve) hours as needed for anxiety.   melatonin 3 MG TABS tablet Take 3 mg by mouth daily.   NYAMYC powder Apply 1 application  topically as needed (groin rash).   nystatin (MYCOSTATIN/NYSTOP) powder Apply 1 Application topically as needed.   polyethylene glycol (MIRALAX / GLYCOLAX) 17 g packet Take 17 g by mouth as needed for mild constipation.   QUEtiapine (SEROQUEL) 25 MG tablet Take 25 mg by mouth every morning.   QUEtiapine (SEROQUEL) 50 MG tablet Take 50 mg by mouth at bedtime.   sodium chloride 1 g tablet Take 1 g by mouth once.   Sodium Fluoride (PREVIDENT 5000 BOOSTER PLUS) 1.1 % PSTE Place onto teeth at bedtime. Brush on teeth with a toothbrush after PM mouth care. Spit out excess and do not rinse   zinc oxide 20 % ointment Apply 1 application  topically as needed (apply to buttocks for redness).   No facility-administered encounter medications on file as of 04/27/2023.    Review of Systems  Unable to perform ROS: Dementia    Immunization History  Administered Date(s) Administered   Fluad Quad(high Dose 65+) 12/07/2021   Influenza, High Dose Seasonal PF 12/13/2022   Influenza-Unspecified 11/11/2019, 11/11/2020   Janssen (J&J) SARS-COV-2 Vaccination 01/13/2022   Moderna Covid-19 Vaccine Bivalent Booster 62yrs & up 12/20/2022   Moderna SARS-COV2 Booster Vaccination 02/02/2021   Moderna Sars-Covid-2  Vaccination 03/06/2019, 04/03/2019, 12/18/2019, 11/11/2020   PPD Test 05/05/2019   Pfizer Covid-19 Vaccine Bivalent Booster 34yrs & up 07/27/2021   Pneumococcal Conjugate-13 12/09/2016   Pneumococcal Polysaccharide-23 12/09/2013   Tdap 03/12/2012   Zoster Recombinant(Shingrix) 11/26/2012, 01/13/2022   Zoster, Live 11/26/2012   Pertinent  Health Maintenance Due  Topic Date Due   OPHTHALMOLOGY EXAM  05/29/2023   HEMOGLOBIN A1C  08/15/2023   FOOT EXAM  12/22/2023   INFLUENZA VACCINE  Completed   MAMMOGRAM  Discontinued   DEXA SCAN  Discontinued   Colonoscopy  Discontinued      01/19/2022   11:42 AM 02/17/2022    1:22 PM 05/22/2022    1:02 PM 12/22/2022    3:01 PM 01/19/2023   12:46 PM  Fall Risk  Falls in the past year? 0 0 0 0 0  Was there an injury with Fall? 0 0 0 0 0  Fall Risk Category Calculator 0 0 0 0 0  Fall Risk Category (Retired) Low Low     (RETIRED) Patient Fall Risk Level Low fall risk Low fall risk     Patient at Risk for Falls Due to No Fall Risks No Fall Risks History of fall(s);Impaired balance/gait;Impaired mobility;Mental status change History of fall(s);Impaired balance/gait;Impaired mobility History of fall(s);Impaired balance/gait;Impaired  mobility  Fall risk Follow up Falls evaluation completed Falls evaluation completed Falls evaluation completed;Education provided;Falls prevention discussed Falls evaluation completed;Education provided;Falls prevention discussed Falls evaluation completed;Education provided;Falls prevention discussed   Functional Status Survey:    Vitals:   04/27/23 1321  BP: 134/78  Pulse: 72  Resp: 18  Temp: 97.6 F (36.4 C)  SpO2: 98%  Weight: 148 lb (67.1 kg)  Height: 5\' 5"  (1.651 m)   Body mass index is 24.63 kg/m. Physical Exam Vitals reviewed.  Constitutional:      General: She is not in acute distress. HENT:     Head: Normocephalic.     Right Ear: There is no impacted cerumen.     Left Ear: There is no impacted  cerumen.     Nose: Nose normal.     Mouth/Throat:     Mouth: Mucous membranes are moist.  Eyes:     General:        Right eye: No discharge.        Left eye: No discharge.  Cardiovascular:     Rate and Rhythm: Normal rate and regular rhythm.     Pulses: Normal pulses.     Heart sounds: Normal heart sounds.  Pulmonary:     Effort: Pulmonary effort is normal. No respiratory distress.     Breath sounds: Normal breath sounds. No wheezing.  Abdominal:     General: Bowel sounds are normal.     Palpations: Abdomen is soft.  Musculoskeletal:     Cervical back: Neck supple.     Right lower leg: No edema.     Comments: Left BKA  Skin:    General: Skin is warm.     Capillary Refill: Capillary refill takes less than 2 seconds.  Neurological:     General: No focal deficit present.     Mental Status: She is alert and oriented to person, place, and time.  Psychiatric:        Mood and Affect: Mood normal.     Labs reviewed: Recent Labs    09/28/22 0000  NA 139  K 4.0  CL 104  CO2 25*  BUN 30*  CREATININE 0.5  CALCIUM 8.8   No results for input(s): "AST", "ALT", "ALKPHOS", "BILITOT", "PROT", "ALBUMIN" in the last 8760 hours. No results for input(s): "WBC", "NEUTROABS", "HGB", "HCT", "MCV", "PLT" in the last 8760 hours. Lab Results  Component Value Date   TSH 1.55 02/15/2023   Lab Results  Component Value Date   HGBA1C 7.0 02/15/2023   Lab Results  Component Value Date   CHOL 188 12/26/2021   HDL 60 12/26/2021   LDLCALC 106 12/26/2021   TRIG 119 12/26/2021    Significant Diagnostic Results in last 30 days:  No results found.  Assessment/Plan 1. Type 2 diabetes mellitus with peripheral vascular disease (HCC) (Primary) - Stable A1c - no hypoglycemia - weight stable - eye exam 05/2022 - not on medication  2. Frontotemporal dementia with behavioral disturbance (HCC) - followed by hospice - dependent with ADLs including feeding - cont Depakote and Seroquel  3.  Hx of BKA, left (HCC) - cont skilled nursing   4. Essential hypertension - controlled without medication  5. Acquired hypothyroidism - TSH stable - cont levothyroxine  6. Hyponatremia - Na+ stable - cont sodium tablets  7. Depression, recurrent (HCC) - no changes in mood - cont Celexa   8. Anxiety - continues to have tics of fake sneezing occasionally - cont ativan prn  Family/ staff Communication: plan discussed with patient and nurse  Labs/tests ordered:  none

## 2023-05-24 ENCOUNTER — Non-Acute Institutional Stay (SKILLED_NURSING_FACILITY): Payer: Self-pay | Admitting: Internal Medicine

## 2023-05-24 DIAGNOSIS — I10 Essential (primary) hypertension: Secondary | ICD-10-CM | POA: Diagnosis not present

## 2023-05-24 DIAGNOSIS — E1151 Type 2 diabetes mellitus with diabetic peripheral angiopathy without gangrene: Secondary | ICD-10-CM

## 2023-05-24 DIAGNOSIS — E039 Hypothyroidism, unspecified: Secondary | ICD-10-CM | POA: Diagnosis not present

## 2023-05-24 DIAGNOSIS — E871 Hypo-osmolality and hyponatremia: Secondary | ICD-10-CM

## 2023-05-24 DIAGNOSIS — G3109 Other frontotemporal dementia: Secondary | ICD-10-CM

## 2023-05-24 DIAGNOSIS — Z89612 Acquired absence of left leg above knee: Secondary | ICD-10-CM

## 2023-05-24 DIAGNOSIS — F339 Major depressive disorder, recurrent, unspecified: Secondary | ICD-10-CM

## 2023-05-24 DIAGNOSIS — F02818 Dementia in other diseases classified elsewhere, unspecified severity, with other behavioral disturbance: Secondary | ICD-10-CM

## 2023-05-27 ENCOUNTER — Encounter: Payer: Self-pay | Admitting: Internal Medicine

## 2023-05-27 NOTE — Progress Notes (Signed)
 [facility]  SNF ([id number])  Provider:   Code Status: DNR/hospice Goals of Care:     02/22/2023    1:30 PM  Advanced Directives  Does Patient Have a Medical Advance Directive? Yes  Type of Estate agent of [place];Living will;Out of facility DNR (pink MOST or yellow form)  Does patient want to make changes to medical advance directive? No - Patient declined  Copy of Healthcare Power of Attorney in Chart? Yes - validated most recent copy scanned in chart (See row information)  Pre-existing out of facility DNR order (yellow form or pink MOST form) Yellow form placed in chart (order not valid for inpatient use)     Chief Complaint  Patient presents with   Care Management    HPI: Patient is a 73 y.o. female seen today for medical management of chronic diseases.    Lives in SNF Enrolled in Hospice   Patient was diagnosed with frontotemporal dementia in 2019.  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 She is retired Engineer, civil (consulting) Her sister is the POA  She is stable No New issues Today She does have some behaviors but mostly controlled Does not respond or Follow any Commands Enrolled in Hospice Needs help with her ALS and Feeding  Wt Readings from Last 3 Encounters:  05/24/23 152 lb (68.9 kg)  04/27/23 148 lb (67.1 kg)  03/23/23 150 lb (68 kg)    Past Medical History:  Diagnosis Date   Dementia (HCC)    Dementia in other diseases classified elsewhere, unspecified severity, with other behavioral disturbance (HCC)    Diabetes mellitus without complication (HCC)    Essential (primary) hypertension    per matrix   Hx of BKA, left (HCC) 10/24/2020   Hypo-osmolality and hyponatremia    Hypothyroidism, unspecified    Other frontotemporal  neurocognitive disorder (CODE) (HCC)    Pure hypercholesterolemia, unspecified    Thyroid disease    Unspecified dementia, unspecified severity, without behavioral disturbance, psychotic disturbance, mood disturbance, and anxiety (HCC)    per matrix    No past surgical history on file.  No Known Allergies  Outpatient Encounter Medications as of 05/24/2023  Medication Sig   acetaminophen (TYLENOL) 500 MG tablet Take 1,000 mg by mouth 2 (two) times daily as needed for fever (pain).   acetaminophen (TYLENOL) 500 MG tablet Take 500 mg by mouth 2 (two) times daily.   Camphor-Menthol-Methyl Sal (SALONPAS) 3.02-19-08 % PTCH Place 1 patch onto the skin See admin instructions. Apply one patch transdermally to left aka stump daily as needed for pain   citalopram (CELEXA) 10 MG tablet Take 15 mg by mouth every morning.   divalproex (DEPAKOTE SPRINKLE) 125 MG capsule Take 250 mg by mouth 3 (three) times daily.   Docusate Sodium 100 MG capsule Take 100 mg by mouth 2 (two) times daily.   guaiFENesin (ROBITUSSIN) 100 MG/5ML liquid Take 10 mLs by mouth every 4 (four) hours as needed for cough or congestion.   hydrocortisone (AQUANIL HC) 1 % lotion Apply 1 Application topically as needed. Forehead/Neck/Jawline for lesions and dryness.   KETOCONAZOLE, TOPICAL, 1 % SHAM Apply 1 Application topically once a week. Thursday.   lactose free nutrition (BOOST) LIQD Take 237 mLs by mouth 3 (three) times daily. After or between  meals for diet supplement   levothyroxine (SYNTHROID) 50 MCG tablet Take 50 mcg by mouth daily before breakfast.   LORazepam (ATIVAN) 0.5 MG tablet Take 1 tablet (0.5 mg total) by mouth every 12 (twelve) hours as needed for anxiety.   melatonin 3 MG TABS tablet Take 3 mg by mouth daily.   NYAMYC powder Apply 1 application  topically as needed (groin rash).   nystatin (MYCOSTATIN/NYSTOP) powder Apply 1 Application topically as needed.   polyethylene glycol (MIRALAX / GLYCOLAX) 17 g packet Take  17 g by mouth as needed for mild constipation.   QUEtiapine (SEROQUEL) 25 MG tablet Take 25 mg by mouth every morning.   QUEtiapine (SEROQUEL) 50 MG tablet Take 50 mg by mouth at bedtime.   sodium chloride 1 g tablet Take 1 g by mouth once.   Sodium Fluoride (PREVIDENT 5000 BOOSTER PLUS) 1.1 % PSTE Place onto teeth at bedtime. Brush on teeth with a toothbrush after PM mouth care. Spit out excess and do not rinse   zinc oxide 20 % ointment Apply 1 application  topically as needed (apply to buttocks for redness).   No facility-administered encounter medications on file as of 05/24/2023.    Review of Systems:  Review of Systems  Unable to perform ROS: Dementia    Health Maintenance  Topic Date Due   Pneumonia Vaccine 53+ Years old (3 of 3 - PCV20 or PCV21) 12/09/2021   COVID-19 Vaccine (8 - 2024-25 season) 02/14/2023   OPHTHALMOLOGY EXAM  05/29/2023   HEMOGLOBIN A1C  08/15/2023   INFLUENZA VACCINE  09/14/2023   FOOT EXAM  12/22/2023   Hepatitis C Screening  Completed   Zoster Vaccines- Shingrix  Completed   HPV VACCINES  Aged Out   Meningococcal B Vaccine  Aged Out   DTaP/Tdap/Td  Discontinued   MAMMOGRAM  Discontinued   DEXA SCAN  Discontinued   Colonoscopy  Discontinued    Physical Exam: There were no vitals filed for this visit. There is no height or weight on file to calculate BMI. Physical Exam Vitals reviewed.  Constitutional:      Appearance: Normal appearance.  HENT:     Head: Normocephalic.     Nose: Nose normal.     Mouth/Throat:     Mouth: Mucous membranes are moist.     Pharynx: Oropharynx is clear.  Eyes:     Pupils: Pupils are equal, round, and reactive to light.  Cardiovascular:     Rate and Rhythm: Normal rate and regular rhythm.     Pulses: Normal pulses.     Heart sounds: Normal heart sounds. No murmur heard. Pulmonary:     Effort: Pulmonary effort is normal.     Breath sounds: Normal breath sounds.  Abdominal:     General: Abdomen is flat. Bowel  sounds are normal.     Palpations: Abdomen is soft.  Musculoskeletal:        General: No swelling.     Cervical back: Neck supple.     Comments: S/p Left AKA  Skin:    General: Skin is warm.  Neurological:     General: No focal deficit present.     Mental Status: She is alert.  Psychiatric:        Mood and Affect: Mood normal.        Thought Content: Thought content normal.     Labs reviewed: Basic Metabolic Panel: Recent Labs    09/28/22 0000 02/15/23 0000  NA 139  --  K 4.0  --   CL 104  --   CO2 25*  --   BUN 30*  --   CREATININE 0.5  --   CALCIUM 8.8  --   TSH  --  1.55   Liver Function Tests: No results for input(s): "AST", "ALT", "ALKPHOS", "BILITOT", "PROT", "ALBUMIN" in the last 8760 hours. No results for input(s): "LIPASE", "AMYLASE" in the last 8760 hours. No results for input(s): "AMMONIA" in the last 8760 hours. CBC: No results for input(s): "WBC", "NEUTROABS", "HGB", "HCT", "MCV", "PLT" in the last 8760 hours. Lipid Panel: No results for input(s): "CHOL", "HDL", "LDLCALC", "TRIG", "CHOLHDL", "LDLDIRECT" in the last 8760 hours. Lab Results  Component Value Date   HGBA1C 7.0 02/15/2023    Procedures since last visit: No results found.  Assessment/Plan 1. Type 2 diabetes mellitus with peripheral vascular disease (HCC) (Primary) Off All meds A1C 7 CBG 120-160  2. Frontotemporal dementia with behavioral disturbance (HCC) Total Care Enrolled in Hospice Depakote and Seroquel 3. Essential hypertension Off all meds  4. Acquired hypothyroidism TSH normal in 01/25  5. Hyponatremia On Sodium Tabs  6. Depression, recurrent (HCC) On Celexa   7. S/P AKA (above knee amputation), left (HCC) Stays in Her Room    Labs/tests ordered:  * No order type specified * Next appt:  Visit date not found

## 2023-06-18 LAB — HM DIABETES EYE EXAM

## 2023-06-20 ENCOUNTER — Encounter: Payer: Self-pay | Admitting: Orthopedic Surgery

## 2023-06-20 ENCOUNTER — Non-Acute Institutional Stay (SKILLED_NURSING_FACILITY): Payer: Self-pay | Admitting: Orthopedic Surgery

## 2023-06-20 DIAGNOSIS — I10 Essential (primary) hypertension: Secondary | ICD-10-CM | POA: Diagnosis not present

## 2023-06-20 DIAGNOSIS — F339 Major depressive disorder, recurrent, unspecified: Secondary | ICD-10-CM

## 2023-06-20 DIAGNOSIS — Z89512 Acquired absence of left leg below knee: Secondary | ICD-10-CM | POA: Diagnosis not present

## 2023-06-20 DIAGNOSIS — E871 Hypo-osmolality and hyponatremia: Secondary | ICD-10-CM

## 2023-06-20 DIAGNOSIS — E1151 Type 2 diabetes mellitus with diabetic peripheral angiopathy without gangrene: Secondary | ICD-10-CM | POA: Diagnosis not present

## 2023-06-20 DIAGNOSIS — F02818 Dementia in other diseases classified elsewhere, unspecified severity, with other behavioral disturbance: Secondary | ICD-10-CM

## 2023-06-20 DIAGNOSIS — G3109 Other frontotemporal dementia: Secondary | ICD-10-CM

## 2023-06-20 DIAGNOSIS — L219 Seborrheic dermatitis, unspecified: Secondary | ICD-10-CM

## 2023-06-20 DIAGNOSIS — E039 Hypothyroidism, unspecified: Secondary | ICD-10-CM

## 2023-06-20 MED ORDER — HYDROCORTISONE 0.5 % EX CREA
1.0000 | TOPICAL_CREAM | Freq: Two times a day (BID) | CUTANEOUS | Status: AC
Start: 2023-06-20 — End: 2023-06-27

## 2023-06-20 NOTE — Progress Notes (Signed)
 Location:   [facility]  Nursing Home Room Number: 29-A Place of Service:  SNF [phone]) Provider:  Ulyses Gandy, NP  PCP: Marguerite Shiley, MD  Patient Care Team: Marguerite Shiley, MD as PCP - General (Internal Medicine)  Extended Emergency Contact Information Primary Emergency Contact: [facility] Phone: [phone] Relation: Sister Secondary Emergency Contact: [facility] Phone: [phone] Mobile Phone: [phone] Relation: Brother  Code Status:  DNR Goals of care: Advanced Directive information    06/20/2023   12:09 PM  Advanced Directives  Does Patient Have a Medical Advance Directive? Yes  Type of Estate agent of [place];Living will;Out of facility DNR (pink MOST or yellow form)  Does patient want to make changes to medical advance directive? No - Patient declined  Copy of Healthcare Power of Attorney in Chart? Yes - validated most recent copy scanned in chart (See row information)     Chief Complaint  Patient presents with   Medical Management of Chronic Issues    Routine visit. Discuss the need for Pne vaccine, Covid Booster, and Eye exam.    HPI:  Pt is a 73 y.o. female seen today for medical management of chronic diseases.    She currently resides on the skilled nursing unit at [facility] due to frontotemporal dementia. PMH: HTN, hypothyroidism, T2DM, alcohol abuse, psychosis, wernicke-korsakoff syndrome, thrombocytopenia.      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   Recent weights:  05/01- 150 lbs  04/01- 152 lbs  03/02- 148 lbs  Recent blood pressures:  05/06- 109/64  04/29- 123/80  04/22- 116/73     Past Medical History:  Diagnosis Date   Dementia (HCC)    Dementia in other diseases classified elsewhere, unspecified severity, with other behavioral disturbance (HCC)    Diabetes mellitus without complication (HCC)    Essential (primary) hypertension    per matrix   Hx of BKA, left (HCC) 10/24/2020   Hypo-osmolality and hyponatremia    Hypothyroidism, unspecified    Other frontotemporal neurocognitive disorder (CODE) (HCC)    Pure hypercholesterolemia, unspecified    Thyroid  disease    Unspecified dementia, unspecified severity, without behavioral disturbance, psychotic disturbance, mood disturbance, and anxiety (HCC)    per matrix   No past surgical history on file.  No Known Allergies  Allergies as of 06/20/2023   No Known Allergies      Medication List        Accurate as of Jun 20, 2023 12:13 PM. If you have any questions, ask your nurse or doctor.          acetaminophen  500 MG tablet Commonly known as: TYLENOL  Take 1,000 mg by mouth 2 (two) times daily as needed for fever (pain).   acetaminophen  500 MG tablet Commonly known as:  TYLENOL  Take 1,000 mg by mouth 2 (two) times daily.   Aquanil HC 1 % lotion Generic drug: hydrocortisone  Apply 1 Application topically as needed. Forehead/Neck/Jawline for lesions and dryness.   citalopram  10 MG tablet Commonly known as: CELEXA  Take 15 mg by mouth  every morning.   CITALOPRAM  HYDROBROMIDE PO Take 15 mg by mouth in the morning.   divalproex 125 MG capsule Commonly known as: DEPAKOTE SPRINKLE Take 250 mg by mouth 3 (three) times daily.   Docusate Sodium  100 MG capsule Take 100 mg by mouth 2 (two) times daily.   guaiFENesin 100 MG/5ML liquid Commonly known as: ROBITUSSIN Take 10 mLs by mouth every 4 (four) hours as needed for cough or congestion.   KETOCONAZOLE  (TOPICAL) 1 % Sham Apply 1 Application topically once a week. Thursday.   lactose free nutrition Liqd Take 237 mLs by mouth 3 (three) times daily. After or between meals for diet supplement   levothyroxine  50 MCG tablet Commonly known as: SYNTHROID  Take 50 mcg by mouth daily before breakfast.   LORazepam  0.5 MG tablet Commonly known as: ATIVAN  Take 1 tablet (0.5 mg total) by mouth every 12 (twelve) hours as needed for anxiety.   melatonin 3 MG Tabs tablet Take 3 mg by mouth daily.   nystatin  powder Commonly known as: MYCOSTATIN /NYSTOP  Apply 1 Application topically as needed.   Nyamyc  powder Generic drug: nystatin  Apply 1 application  topically as needed (groin rash).   polyethylene glycol 17 g packet Commonly known as: MIRALAX  / GLYCOLAX  Take 17 g by mouth as needed for mild constipation.   PreviDent 5000 Booster Plus 1.1 % Pste Generic drug: Sodium Fluoride Place onto teeth at bedtime. Brush on teeth with a toothbrush after PM mouth care. Spit out excess and do not rinse   QUEtiapine  50 MG tablet Commonly known as: SEROQUEL  Take 50 mg by mouth at bedtime.   QUEtiapine  25 MG tablet Commonly known as: SEROQUEL  Take 25 mg by mouth every morning.   Salonpas 3.02-19-08 % Ptch Generic drug: Camphor-Menthol-Methyl Sal Place 1 patch onto the skin See admin instructions. Apply one patch transdermally to left aka stump daily as needed for pain   sodium chloride  1 g tablet Take 1 g by mouth once.   zinc  oxide 20 % ointment Apply 1 application  topically  as needed (apply to buttocks for redness).        Review of Systems  Unable to perform ROS: Dementia    Immunization History  Administered Date(s) Administered   Fluad Quad(high Dose 65+) 12/07/2021   Influenza, High Dose Seasonal PF 12/13/2022   Influenza-Unspecified 11/11/2019, 11/11/2020   Janssen (J&J) SARS-COV-2 Vaccination 01/13/2022   Moderna Covid-19 Vaccine Bivalent Booster 45yrs & up 12/20/2022   Moderna SARS-COV2 Booster Vaccination 02/02/2021   Moderna Sars-Covid-2 Vaccination 03/06/2019, 04/03/2019, 12/18/2019, 11/11/2020   PPD Test 05/05/2019   Pfizer Covid-19 Vaccine Bivalent Booster 22yrs & up 07/27/2021   Pneumococcal Conjugate-13 12/09/2016   Pneumococcal Polysaccharide-23 12/09/2013   Tdap 03/12/2012   Zoster Recombinant(Shingrix) 11/26/2012, 01/13/2022   Zoster, Live 11/26/2012   Pertinent  Health Maintenance Due  Topic Date Due   OPHTHALMOLOGY EXAM  05/29/2023   HEMOGLOBIN A1C  08/15/2023   INFLUENZA VACCINE  09/14/2023   FOOT EXAM  12/22/2023   MAMMOGRAM  Discontinued   DEXA SCAN  Discontinued   Colonoscopy  Discontinued      01/19/2022   11:42 AM 02/17/2022    1:22 PM 05/22/2022    1:02 PM 12/22/2022  3:01 PM 01/19/2023   12:46 PM  Fall Risk  Falls in the past year? 0 0 0 0 0  Was there an injury with Fall? 0 0 0 0 0  Fall Risk Category Calculator 0 0 0 0 0  Fall Risk Category (Retired) Low Low     (RETIRED) Patient Fall Risk Level Low fall risk Low fall risk     Patient at Risk for Falls Due to No Fall Risks No Fall Risks History of fall(s);Impaired balance/gait;Impaired mobility;Mental status change History of fall(s);Impaired balance/gait;Impaired mobility History of fall(s);Impaired balance/gait;Impaired mobility  Fall risk Follow up Falls evaluation completed Falls evaluation completed Falls evaluation completed;Education provided;Falls prevention discussed Falls evaluation completed;Education provided;Falls prevention discussed Falls  evaluation completed;Education provided;Falls prevention discussed   Functional Status Survey:    Vitals:   06/20/23 1158  BP: 109/64  Pulse: 77  Resp: 19  Temp: 98.3 F (36.8 C)  SpO2: 99%  Weight: 150 lb (68 kg)  Height: 5\' 5"  (1.651 m)   Body mass index is 24.96 kg/m. Physical Exam Vitals reviewed.  Constitutional:      General: She is not in acute distress. HENT:     Head: Normocephalic.     Nose: Nose normal.     Mouth/Throat:     Mouth: Mucous membranes are moist.  Eyes:     General:        Right eye: No discharge.        Left eye: No discharge.  Cardiovascular:     Rate and Rhythm: Normal rate and regular rhythm.     Pulses: Normal pulses.     Heart sounds: Normal heart sounds.  Pulmonary:     Effort: Pulmonary effort is normal.     Breath sounds: Normal breath sounds.  Abdominal:     General: Bowel sounds are normal.     Palpations: Abdomen is soft.  Musculoskeletal:     Cervical back: Neck supple.     Comments: Left BKA  Skin:    General: Skin is warm.     Capillary Refill: Capillary refill takes less than 2 seconds.     Comments: Flaking skin with redness to scalp, hairline and brows  Neurological:     General: No focal deficit present.     Mental Status: She is alert. Mental status is at baseline.     Motor: Weakness present.     Gait: Gait abnormal.  Psychiatric:     Comments: Alert to self, nonverbal, does not follow commands     Labs reviewed: Recent Labs    09/28/22 0000  NA 139  K 4.0  CL 104  CO2 25*  BUN 30*  CREATININE 0.5  CALCIUM 8.8   No results for input(s): "AST", "ALT", "ALKPHOS", "BILITOT", "PROT", "ALBUMIN" in the last 8760 hours. No results for input(s): "WBC", "NEUTROABS", "HGB", "HCT", "MCV", "PLT" in the last 8760 hours. Lab Results  Component Value Date   TSH 1.55 02/15/2023   Lab Results  Component Value Date   HGBA1C 7.0 02/15/2023   Lab Results  Component Value Date   CHOL 188 12/26/2021   HDL 60  12/26/2021   LDLCALC 106 12/26/2021   TRIG 119 12/26/2021    Significant Diagnostic Results in last 30 days:  No results found.  Assessment/Plan: 1. Seborrheic dermatitis (Primary) - ongoing - dry flaking skin with redness to scalp, hairline, brows - will schedule hydrocortisone  BID x 7 days, then daily  - cont ketoconazole  shampoo - hydrocortisone  cream  0.5 %; Apply 1 Application topically 2 (two) times daily for 7 days.  2. 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 Frontotemporal dementia with behavioral disturbance (HCC) - no agitation - sleeping more  - dependent with ADLs - cont Depakote, Seroquel  and ativan   4. Hx of BKA, left (HCC) - cont skilled nursing  5. Essential hypertension - controlled without medication  6. Acquired hypothyroidism - cont levothyroxine   7. Hyponatremia - Na+ stable - cont sodium tablets  8. Depression, recurrent (HCC) - no changes to mood - cont citalopram     Family/ staff Communication: plan discussed with HPOA and nursing  Labs/tests ordered:  A1c, cmp, TSH 07/19/2023

## 2023-07-12 ENCOUNTER — Other Ambulatory Visit: Payer: Self-pay | Admitting: Internal Medicine

## 2023-07-12 DIAGNOSIS — F419 Anxiety disorder, unspecified: Secondary | ICD-10-CM

## 2023-07-12 DIAGNOSIS — G3109 Other frontotemporal dementia: Secondary | ICD-10-CM

## 2023-07-12 MED ORDER — LORAZEPAM 0.5 MG PO TABS
0.5000 mg | ORAL_TABLET | Freq: Two times a day (BID) | ORAL | 0 refills | Status: DC | PRN
Start: 2023-07-12 — End: 2023-10-19

## 2023-07-19 DIAGNOSIS — E039 Hypothyroidism, unspecified: Secondary | ICD-10-CM | POA: Diagnosis not present

## 2023-07-19 DIAGNOSIS — E119 Type 2 diabetes mellitus without complications: Secondary | ICD-10-CM | POA: Diagnosis not present

## 2023-07-19 LAB — HEPATIC FUNCTION PANEL
ALT: 8 U/L (ref 7–35)
AST: 9 — AB (ref 13–35)
Alkaline Phosphatase: 33 (ref 25–125)
Bilirubin, Total: 0.5

## 2023-07-19 LAB — COMPREHENSIVE METABOLIC PANEL WITH GFR
Albumin: 3.8 (ref 3.5–5.0)
Calcium: 9 (ref 8.7–10.7)
Globulin: 2.3
eGFR: 94

## 2023-07-19 LAB — BASIC METABOLIC PANEL WITH GFR
BUN: 18 (ref 4–21)
CO2: 23 — AB (ref 13–22)
Chloride: 107 (ref 99–108)
Creatinine: 0.6 (ref 0.5–1.1)
Glucose: 86
Potassium: 4 meq/L (ref 3.5–5.1)
Sodium: 139 (ref 137–147)

## 2023-07-19 LAB — TSH: TSH: 0.97 (ref 0.41–5.90)

## 2023-07-19 LAB — HEMOGLOBIN A1C: Hemoglobin A1C: 5.8

## 2023-07-23 ENCOUNTER — Non-Acute Institutional Stay (SKILLED_NURSING_FACILITY): Admitting: Orthopedic Surgery

## 2023-07-23 ENCOUNTER — Encounter: Payer: Self-pay | Admitting: Orthopedic Surgery

## 2023-07-23 DIAGNOSIS — F02818 Dementia in other diseases classified elsewhere, unspecified severity, with other behavioral disturbance: Secondary | ICD-10-CM

## 2023-07-23 DIAGNOSIS — L219 Seborrheic dermatitis, unspecified: Secondary | ICD-10-CM

## 2023-07-23 DIAGNOSIS — E1151 Type 2 diabetes mellitus with diabetic peripheral angiopathy without gangrene: Secondary | ICD-10-CM

## 2023-07-23 DIAGNOSIS — F419 Anxiety disorder, unspecified: Secondary | ICD-10-CM

## 2023-07-23 DIAGNOSIS — G3109 Other frontotemporal dementia: Secondary | ICD-10-CM | POA: Diagnosis not present

## 2023-07-23 DIAGNOSIS — I10 Essential (primary) hypertension: Secondary | ICD-10-CM

## 2023-07-23 DIAGNOSIS — E039 Hypothyroidism, unspecified: Secondary | ICD-10-CM

## 2023-07-23 DIAGNOSIS — Z89512 Acquired absence of left leg below knee: Secondary | ICD-10-CM | POA: Diagnosis not present

## 2023-07-23 DIAGNOSIS — F339 Major depressive disorder, recurrent, unspecified: Secondary | ICD-10-CM

## 2023-07-23 NOTE — Progress Notes (Signed)
 Location:  [facility] Room Number: 29/A Place of Service:  SNF [phone]) Provider:  Arnetha Bhat, NP   Marguerite Shiley, MD  Patient Care Team: Marguerite Shiley, MD as PCP - General (Internal Medicine)  Extended Emergency Contact Information Primary Emergency Contact: [facility] Phone: [phone] Relation: Sister Secondary Emergency Contact: [facility] Phone: [phone] Mobile Phone: [phone] Relation: Brother  Code Status:  DNR Goals of care: Advanced Directive information    06/20/2023   12:09 PM  Advanced Directives  Does Patient Have a Medical Advance Directive? Yes  Type of Estate agent of [place];Living will;Out of facility DNR (pink MOST or yellow form)  Does patient want to make changes to medical advance directive? No - Patient declined  Copy of Healthcare Power of Attorney in Chart? Yes - validated most recent copy scanned in chart (See row information)     Chief Complaint  Patient presents with   Medical Management of Chronic Issues    HPI:  Pt is a 73 y.o. female seen today for medical management of chronic diseases.    She currently resides on the skilled nursing unit at [facility] due to frontotemporal dementia. PMH: HTN, hypothyroidism, T2DM, alcohol abuse, psychosis, wernicke-korsakoff syndrome, thrombocytopenia.     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 and right periventricular basal ganglia, marked severe cerebral atrophy temporal parietal lobes,dependent with all ADLs including feeding, nonverbal, tics of fake sneezing occur when she wants staff per nursing, remains on Seroquel , Depakote ( AST/ALT 9/8 07/19/2023) and ativan  Left BKA- h/o  acute limb ischemia, ambulates with  wheelchair/hoyer HTN- BUN/creat 18/0.64 07/19/2023, not on medication Hypothyroidism- TSH 0.97 07/19/2023, remains on levothyroxine  Hyponatremia- Na 136 03/22/2023, remains on sodium tablets Depression and anxiety- remains on Celexa  and lorazepam  prn Seborrheic dermatitis- improved with hydrocortisone  cream to hairline daily, remains on ketoconazole  shampoo  No recent falls or injuries.   Recent blood pressures:  06/03- 138/87  05/27- 113/68  05/20- 136/82  Recent weights:  06/01- 148.9 lbs  05/01- 150 lbs  04/01- 152 lbs    Past Medical History:  Diagnosis Date   Dementia (HCC)    Dementia in other diseases classified elsewhere, unspecified severity, with other behavioral disturbance (HCC)    Diabetes mellitus without complication (HCC)    Essential (primary) hypertension    per matrix   Hx of BKA, left (HCC) 10/24/2020   Hypo-osmolality and hyponatremia    Hypothyroidism, unspecified    Other frontotemporal neurocognitive disorder (CODE) (HCC)    Pure hypercholesterolemia, unspecified    Thyroid  disease    Unspecified dementia, unspecified severity, without behavioral disturbance, psychotic disturbance, mood disturbance, and anxiety (HCC)    per matrix   No past surgical history on file.  No Known Allergies  Outpatient Encounter Medications as of 07/23/2023  Medication Sig   acetaminophen  (TYLENOL ) 500 MG tablet Take 1,000 mg by mouth 2 (two) times daily as needed for fever (pain).   acetaminophen  (TYLENOL ) 500 MG tablet Take 1,000 mg by mouth 2 (two) times daily.   Camphor-Menthol-Methyl Sal (SALONPAS) 3.02-19-08 % PTCH Place 1 patch onto the skin See admin instructions. Apply one patch transdermally to left aka stump daily as needed for pain   CITALOPRAM  HYDROBROMIDE PO Take 15 mg by mouth in the morning.  divalproex (DEPAKOTE SPRINKLE) 125 MG capsule Take 250 mg by mouth 3 (three) times daily.   Docusate Sodium  100 MG capsule Take 100 mg by mouth 2 (two) times daily.    guaiFENesin (ROBITUSSIN) 100 MG/5ML liquid Take 10 mLs by mouth every 4 (four) hours as needed for cough or congestion.   KETOCONAZOLE , TOPICAL, 1 % SHAM Apply 1 Application topically once a week. Thursday.   lactose free nutrition (BOOST) LIQD Take 237 mLs by mouth 3 (three) times daily. After or between meals for diet supplement   levothyroxine  (SYNTHROID ) 50 MCG tablet Take 50 mcg by mouth daily before breakfast.   LORazepam  (ATIVAN ) 0.5 MG tablet Take 1 tablet (0.5 mg total) by mouth every 12 (twelve) hours as needed for anxiety.   melatonin 3 MG TABS tablet Take 3 mg by mouth daily.   NYAMYC  powder Apply 1 application  topically as needed (groin rash).   nystatin  (MYCOSTATIN /NYSTOP ) powder Apply 1 Application topically as needed.   polyethylene glycol (MIRALAX  / GLYCOLAX ) 17 g packet Take 17 g by mouth as needed for mild constipation.   QUEtiapine  (SEROQUEL ) 25 MG tablet Take 25 mg by mouth every morning.   QUEtiapine  (SEROQUEL ) 50 MG tablet Take 50 mg by mouth at bedtime.   sodium chloride  1 g tablet Take 1 g by mouth once.   Sodium Fluoride (PREVIDENT 5000 BOOSTER PLUS) 1.1 % PSTE Place onto teeth at bedtime. Brush on teeth with a toothbrush after PM mouth care. Spit out excess and do not rinse   zinc  oxide 20 % ointment Apply 1 application  topically as needed (apply to buttocks for redness).   No facility-administered encounter medications on file as of 07/23/2023.    Review of Systems  Unable to perform ROS: Dementia    Immunization History  Administered Date(s) Administered   Fluad Quad(high Dose 65+) 12/07/2021   Influenza, High Dose Seasonal PF 12/13/2022   Influenza-Unspecified 11/11/2019, 11/11/2020   Janssen (J&J) SARS-COV-2 Vaccination 01/13/2022   Moderna Covid-19 Vaccine Bivalent Booster 5yrs & up 12/20/2022   Moderna SARS-COV2 Booster Vaccination 02/02/2021   Moderna Sars-Covid-2 Vaccination 03/06/2019, 04/03/2019, 12/18/2019, 11/11/2020   PPD Test 05/05/2019    Pfizer Covid-19 Vaccine Bivalent Booster 41yrs & up 07/27/2021   Pneumococcal Conjugate-13 12/09/2016   Pneumococcal Polysaccharide-23 12/09/2013   Tdap 03/12/2012   Zoster Recombinant(Shingrix) 11/26/2012, 01/13/2022   Zoster, Live 11/26/2012   Pertinent  Health Maintenance Due  Topic Date Due   HEMOGLOBIN A1C  08/15/2023   INFLUENZA VACCINE  09/14/2023   FOOT EXAM  12/22/2023   OPHTHALMOLOGY EXAM  06/17/2024   MAMMOGRAM  Discontinued   DEXA SCAN  Discontinued   Colonoscopy  Discontinued      01/19/2022   11:42 AM 02/17/2022    1:22 PM 05/22/2022    1:02 PM 12/22/2022    3:01 PM 01/19/2023   12:46 PM  Fall Risk  Falls in the past year? 0 0 0 0 0  Was there an injury with Fall? 0 0 0 0 0  Fall Risk Category Calculator 0 0 0 0 0  Fall Risk Category (Retired) Low Low     (RETIRED) Patient Fall Risk Level Low fall risk Low fall risk     Patient at Risk for Falls Due to No Fall Risks No Fall Risks History of fall(s);Impaired balance/gait;Impaired mobility;Mental status change History of fall(s);Impaired balance/gait;Impaired mobility History of fall(s);Impaired balance/gait;Impaired mobility  Fall risk Follow up Falls evaluation completed Falls evaluation completed Falls evaluation completed;Education provided;Falls prevention  discussed Falls evaluation completed;Education provided;Falls prevention discussed Falls evaluation completed;Education provided;Falls prevention discussed   Functional Status Survey:    Vitals:   07/23/23 0941  BP: 138/87  Resp: 17  Temp: 98 F (36.7 C)  SpO2: 95%  Weight: 148 lb 14.4 oz (67.5 kg)  Height: 5\' 5"  (1.651 m)   Body mass index is 24.78 kg/m. Physical Exam Vitals reviewed.  Constitutional:      General: She is not in acute distress. HENT:     Head: Normocephalic.     Right Ear: There is no impacted cerumen.     Left Ear: There is no impacted cerumen.     Nose: Nose normal.     Mouth/Throat:     Mouth: Mucous membranes are moist.   Eyes:     General:        Right eye: No discharge.        Left eye: No discharge.  Cardiovascular:     Rate and Rhythm: Normal rate and regular rhythm.     Pulses: Normal pulses.     Heart sounds: Normal heart sounds.  Pulmonary:     Effort: Pulmonary effort is normal.     Breath sounds: Normal breath sounds.  Abdominal:     General: Bowel sounds are normal.     Palpations: Abdomen is soft.  Musculoskeletal:     Cervical back: Neck supple.     Right lower leg: No edema.     Comments: Left BKA  Skin:    General: Skin is warm.     Capillary Refill: Capillary refill takes less than 2 seconds.     Comments: Small skin tear to left stump, CDI, surrounding skin balanchable  Neurological:     General: No focal deficit present.     Mental Status: She is alert.     Motor: Weakness present.     Gait: Gait abnormal.     Comments: hoyer  Psychiatric:     Comments: Aphasia, does not follow commands, alert to self     Labs reviewed: Recent Labs    09/28/22 0000  NA 139  K 4.0  CL 104  CO2 25*  BUN 30*  CREATININE 0.5  CALCIUM 8.8   No results for input(s): "AST", "ALT", "ALKPHOS", "BILITOT", "PROT", "ALBUMIN" in the last 8760 hours. No results for input(s): "WBC", "NEUTROABS", "HGB", "HCT", "MCV", "PLT" in the last 8760 hours. Lab Results  Component Value Date   TSH 1.55 02/15/2023   Lab Results  Component Value Date   HGBA1C 7.0 02/15/2023   Lab Results  Component Value Date   CHOL 188 12/26/2021   HDL 60 12/26/2021   LDLCALC 106 12/26/2021   TRIG 119 12/26/2021    Significant Diagnostic Results in last 30 days:  No results found.  Assessment/Plan 1. 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 Frontotemporal dementia with behavioral disturbance (HCC) - followed by hospice - aphasia, does not follow commands - dependent with ADLs including  feeding - weight stable - cont Seroquel  and Depakote (LFT stable)   3. Hx of BKA, left (HCC) - cont skilled nursing  4. Essential hypertension - controlled without medication  5. Acquired hypothyroidism - TSH stable - cont levothyroxine    6. Depression, recurrent (HCC) - no mood changes - cont Celexa   7. Anxiety - cont ativan  prn  8. Seborrheic dermatitis - improved with  hydrocortisone  cream  - cont ketoconazole  shampoo    Family/ staff Communication: plan discussed with patient and nurse  Labs/tests ordered:  none

## 2023-08-22 ENCOUNTER — Encounter: Payer: Self-pay | Admitting: Internal Medicine

## 2023-08-22 ENCOUNTER — Non-Acute Institutional Stay (SKILLED_NURSING_FACILITY): Admitting: Internal Medicine

## 2023-08-22 DIAGNOSIS — E039 Hypothyroidism, unspecified: Secondary | ICD-10-CM

## 2023-08-22 DIAGNOSIS — E1151 Type 2 diabetes mellitus with diabetic peripheral angiopathy without gangrene: Secondary | ICD-10-CM | POA: Diagnosis not present

## 2023-08-22 DIAGNOSIS — F339 Major depressive disorder, recurrent, unspecified: Secondary | ICD-10-CM

## 2023-08-22 DIAGNOSIS — G3109 Other frontotemporal dementia: Secondary | ICD-10-CM

## 2023-08-22 DIAGNOSIS — E871 Hypo-osmolality and hyponatremia: Secondary | ICD-10-CM | POA: Diagnosis not present

## 2023-08-22 DIAGNOSIS — I10 Essential (primary) hypertension: Secondary | ICD-10-CM

## 2023-08-22 DIAGNOSIS — Z89512 Acquired absence of left leg below knee: Secondary | ICD-10-CM | POA: Diagnosis not present

## 2023-08-22 DIAGNOSIS — F02818 Dementia in other diseases classified elsewhere, unspecified severity, with other behavioral disturbance: Secondary | ICD-10-CM

## 2023-08-22 NOTE — Progress Notes (Deleted)
 Location:  [facility] Room Number: 29-A Place of Service:  SNF [phone]) Provider:  Charlanne Fredia CROME, MD  Patient Care Team: Charlanne Fredia CROME, MD as PCP - General (Internal Medicine)  Extended Emergency Contact Information Primary Emergency Contact: [facility] Phone: [phone] Relation: Sister Secondary Emergency Contact: [facility] Phone: [phone] Mobile Phone: [phone] Relation: Brother  Code Status:  DNR Goals of care: Advanced Directive information    06/20/2023   12:09 PM  Advanced Directives  Does Patient Have a Medical Advance Directive? Yes  Type of Estate agent of [place];Living will;Out of facility DNR (pink MOST or yellow form)  Does patient want to make changes to medical advance directive? No - Patient declined  Copy of Healthcare Power of Attorney in Chart? Yes - validated most recent copy scanned in chart (See row information)     Chief Complaint  Patient presents with   Medical Management of Chronic Issues    Routine visit    HPI:  Pt is a 73 y.o. female seen today for medical management of chronic diseases.     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 No pertinent surgical history.  No Known Allergies  Outpatient Encounter Medications as of 08/22/2023  Medication Sig    acetaminophen  (TYLENOL ) 500 MG tablet Take 1,000 mg by mouth 2 (two) times daily as needed for fever (pain).   acetaminophen  (TYLENOL ) 500 MG tablet Take 1,000 mg by mouth 2 (two) times daily.   Camphor-Menthol-Methyl Sal (SALONPAS) 3.02-19-08 % PTCH Place 1 patch onto the skin See admin instructions. Apply one patch transdermally to left aka stump daily as needed for pain   CITALOPRAM  HYDROBROMIDE PO Take 15 mg by mouth in the morning.   divalproex (DEPAKOTE SPRINKLE) 125 MG capsule Take 250 mg by mouth 3 (three) times daily.   Docusate Sodium  100 MG capsule Take 100 mg by mouth 2 (two) times daily.   guaiFENesin (ROBITUSSIN) 100 MG/5ML liquid Take 10 mLs by mouth every 4 (four) hours as needed for cough or congestion.   hydrocortisone  1 % lotion Apply 1 Application topically as needed for itching Mervyn radish and jawlines for lesions and dryness).   hydrocortisone  cream 0.5 % Apply 1 Application topically daily. Apply to hairline daily   KETOCONAZOLE , TOPICAL, 1 % SHAM Apply 1 Application topically once a week. Thursday.   lactose free nutrition (BOOST) LIQD Take 237 mLs by mouth 3 (three) times daily. After or between meals for diet supplement   levothyroxine  (SYNTHROID ) 50 MCG tablet Take 50 mcg by mouth daily before breakfast.   LORazepam  (ATIVAN ) 0.5 MG tablet Take 1 tablet (0.5 mg total) by mouth every 12 (twelve) hours as needed for anxiety.   melatonin 3 MG TABS tablet Take 3 mg by mouth daily.   NYAMYC  powder Apply 1 application  topically as needed (groin rash).   nystatin  (MYCOSTATIN /NYSTOP ) powder Apply 1  Application topically as needed.   polyethylene glycol (MIRALAX  / GLYCOLAX ) 17 g packet Take 17 g by mouth as needed for mild constipation.   QUEtiapine  (SEROQUEL ) 25 MG tablet Take 25 mg by mouth every morning.   QUEtiapine  (SEROQUEL ) 50 MG tablet Take 50 mg by mouth at bedtime.   sodium chloride  1 g tablet Take 1 g by mouth once.   Sodium Fluoride (PREVIDENT 5000 BOOSTER PLUS)  1.1 % PSTE Place onto teeth at bedtime. Brush on teeth with a toothbrush after PM mouth care. Spit out excess and do not rinse   zinc  oxide 20 % ointment Apply 1 application  topically as needed (apply to buttocks for redness).   No facility-administered encounter medications on file as of 08/22/2023.    Review of Systems  Immunization History  Administered Date(s) Administered   Fluad Quad(high Dose 65+) 12/07/2021   Influenza, High Dose Seasonal PF 12/13/2022   Influenza-Unspecified 11/11/2019, 11/11/2020   Janssen (J&J) SARS-COV-2 Vaccination 01/13/2022   Moderna Covid-19 Vaccine Bivalent Booster 33yrs & up 12/20/2022   Moderna SARS-COV2 Booster Vaccination 02/02/2021   Moderna Sars-Covid-2 Vaccination 03/06/2019, 04/03/2019, 12/18/2019, 11/11/2020   PPD Test 05/05/2019   Pfizer Covid-19 Vaccine Bivalent Booster 51yrs & up 07/27/2021   Pneumococcal Conjugate-13 12/09/2016   Pneumococcal Polysaccharide-23 12/09/2013   Tdap 03/12/2012   Zoster Recombinant(Shingrix) 11/26/2012, 01/13/2022   Zoster, Live 11/26/2012   Pertinent  Health Maintenance Due  Topic Date Due   HEMOGLOBIN A1C  08/15/2023   INFLUENZA VACCINE  09/14/2023   OPHTHALMOLOGY EXAM  06/17/2024   FOOT EXAM  07/22/2024   MAMMOGRAM  Discontinued   DEXA SCAN  Discontinued   Colonoscopy  Discontinued      02/17/2022    1:22 PM 05/22/2022    1:02 PM 12/22/2022    3:01 PM 01/19/2023   12:46 PM 07/23/2023    1:50 PM  Fall Risk  Falls in the past year? 0 0 0 0 0  Was there an injury with Fall? 0 0 0 0 0  Fall Risk Category Calculator 0 0 0 0 0  Fall Risk Category (Retired) Low       (RETIRED) Patient Fall Risk Level Low fall risk       Patient at Risk for Falls Due to No Fall Risks History of fall(s);Impaired balance/gait;Impaired mobility;Mental status change History of fall(s);Impaired balance/gait;Impaired mobility History of fall(s);Impaired balance/gait;Impaired mobility History of fall(s);Impaired balance/gait  Fall  risk Follow up Falls evaluation completed  Falls evaluation completed;Education provided;Falls prevention discussed Falls evaluation completed;Education provided;Falls prevention discussed Falls evaluation completed;Education provided;Falls prevention discussed Falls evaluation completed;Education provided     Data saved with a previous flowsheet row definition   Functional Status Survey:    Vitals:   08/22/23 1353  BP: 132/79  Pulse: 70  Temp: 97.8 F (36.6 C)  SpO2: 93%  Weight: 143 lb 6.4 oz (65 kg)  Height: 5' 5 (1.651 m)   Body mass index is 23.86 kg/m. Physical Exam  Labs reviewed: Recent Labs    09/28/22 0000  NA 139  K 4.0  CL 104  CO2 25*  BUN 30*  CREATININE 0.5  CALCIUM 8.8   No results for input(s): AST, ALT, ALKPHOS, BILITOT, PROT, ALBUMIN in the last 8760 hours. No results for input(s): WBC, NEUTROABS, HGB, HCT, MCV, PLT in the last 8760 hours. Lab Results  Component Value Date   TSH 1.55 02/15/2023   Lab Results  Component Value Date   HGBA1C 7.0 02/15/2023  Lab Results  Component Value Date   CHOL 188 12/26/2021   HDL 60 12/26/2021   LDLCALC 106 12/26/2021   TRIG 119 12/26/2021    Significant Diagnostic Results in last 30 days:  No results found.  Assessment/Plan There are no diagnoses linked to this encounter.   Family/ staff Communication: ***  Labs/tests ordered:  ***

## 2023-08-22 NOTE — Progress Notes (Unsigned)
 Location:  [facility] Room Number: 29-A Place of Service:  SNF [phone]) Provider:  Charlanne Fredia CROME, MD  Patient Care Team: Charlanne Fredia CROME, MD as PCP - General (Internal Medicine)  Extended Emergency Contact Information Primary Emergency Contact: [facility] Phone: [phone] Relation: Sister Secondary Emergency Contact: [facility] Phone: [phone] Mobile Phone: [phone] Relation: Brother  Code Status:  DNR Goals of care: Advanced Directive information    06/20/2023   12:09 PM  Advanced Directives  Does Patient Have a Medical Advance Directive? Yes  Type of Estate agent of [place];Living will;Out of facility DNR (pink MOST or yellow form)  Does patient want to make changes to medical advance directive? No - Patient declined  Copy of Healthcare Power of Attorney in Chart? Yes - validated most recent copy scanned in chart (See row information)     Chief Complaint  Patient presents with   Medical Management of Chronic Issues    Routine visit    HPI:  Pt is a 73 y.o. female seen today for medical management of chronic diseases.   Lives in SNF Enrolled in Hospice   Patient was diagnosed with frontotemporal dementia in 2019.  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 She is retired Engineer, civil (consulting) Her sister is the POA  She is stable  Did have facial rash Dermatitis  Behaviors Controlled.  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 No pertinent surgical history.  No Known Allergies  Outpatient Encounter Medications as of 08/22/2023  Medication Sig   acetaminophen  (TYLENOL ) 500 MG tablet Take 1,000 mg by mouth 2 (two) times daily as needed for fever (pain).   acetaminophen  (TYLENOL ) 500 MG tablet Take 1,000 mg by mouth 2 (two) times daily.   Camphor-Menthol-Methyl Sal (SALONPAS) 3.02-19-08 % PTCH Place 1 patch onto the skin See admin instructions. Apply one patch transdermally to left aka stump daily as needed for pain   CITALOPRAM  HYDROBROMIDE PO Take 15 mg by mouth in the morning.   divalproex (DEPAKOTE SPRINKLE) 125 MG capsule Take 250 mg by mouth 3 (three) times daily.   Docusate Sodium  100 MG capsule Take 100 mg by mouth 2 (two) times daily.   guaiFENesin (ROBITUSSIN) 100 MG/5ML liquid Take 10 mLs by mouth every 4 (four) hours as needed for cough or congestion.   hydrocortisone  1 % lotion Apply 1 Application topically as needed for itching Mervyn radish and jawlines for lesions and dryness).   hydrocortisone  cream  0.5 % Apply 1 Application topically daily. Apply to hairline daily   KETOCONAZOLE , TOPICAL, 1 % SHAM Apply 1 Application topically once a week. Thursday.   lactose free nutrition (BOOST) LIQD Take 237 mLs by mouth 3 (three) times daily. After or between meals for diet supplement   levothyroxine  (SYNTHROID ) 50 MCG tablet Take 50 mcg by mouth daily before breakfast.    LORazepam  (ATIVAN ) 0.5 MG tablet Take 1 tablet (0.5 mg total) by mouth every 12 (twelve) hours as needed for anxiety.   melatonin 3 MG TABS tablet Take 3 mg by mouth daily.   NYAMYC  powder Apply 1 application  topically as needed (groin rash).   nystatin  (MYCOSTATIN /NYSTOP ) powder Apply 1 Application topically as needed.   polyethylene glycol (MIRALAX  / GLYCOLAX ) 17 g packet Take 17 g by mouth as needed for mild constipation.   QUEtiapine  (SEROQUEL ) 25 MG tablet Take 25 mg by mouth every morning.   QUEtiapine  (SEROQUEL ) 50 MG tablet Take 50 mg by mouth at bedtime.   sodium chloride  1 g tablet Take 1 g by mouth once.   Sodium Fluoride (PREVIDENT 5000 BOOSTER PLUS) 1.1 % PSTE Place onto teeth at bedtime. Brush on teeth with a toothbrush after PM mouth care. Spit out excess and do not rinse   zinc  oxide 20 % ointment Apply 1 application  topically as needed (apply to buttocks for redness).   No facility-administered encounter medications on file as of 08/22/2023.    Review of Systems  Unable to perform ROS: Dementia    Immunization History  Administered Date(s) Administered   Fluad Quad(high Dose 65+) 12/07/2021   Influenza, High Dose Seasonal PF 12/13/2022   Influenza-Unspecified 11/11/2019, 11/11/2020   Janssen (J&J) SARS-COV-2 Vaccination 01/13/2022   Moderna Covid-19 Fall Seasonal Vaccine 12yrs & older 06/06/2023   Moderna Covid-19 Vaccine Bivalent Booster 34yrs & up 12/20/2022   Moderna SARS-COV2 Booster Vaccination 02/02/2021   Moderna Sars-Covid-2 Vaccination 03/06/2019, 04/03/2019, 12/18/2019, 11/11/2020   PPD Test 05/05/2019   Pfizer Covid-19 Vaccine Bivalent Booster 65yrs & up 07/27/2021   Pneumococcal Conjugate-13 12/09/2016   Pneumococcal Polysaccharide-23 12/09/2013   Tdap 03/12/2012   Zoster Recombinant(Shingrix) 11/26/2012, 01/13/2022   Zoster, Live 11/26/2012   Pertinent  Health Maintenance Due  Topic Date Due   INFLUENZA VACCINE  09/14/2023   HEMOGLOBIN A1C   01/18/2024   OPHTHALMOLOGY EXAM  06/17/2024   FOOT EXAM  07/22/2024   MAMMOGRAM  Discontinued   DEXA SCAN  Discontinued   Colonoscopy  Discontinued      02/17/2022    1:22 PM 05/22/2022    1:02 PM 12/22/2022    3:01 PM 01/19/2023   12:46 PM 07/23/2023    1:50 PM  Fall Risk  Falls in the past year? 0 0 0 0 0  Was there an injury with Fall? 0 0 0 0 0  Fall Risk Category Calculator 0 0 0 0 0  Fall Risk Category (Retired) Low       (RETIRED) Patient Fall Risk Level Low fall risk       Patient at Risk for Falls Due to No Fall Risks History of fall(s);Impaired balance/gait;Impaired mobility;Mental status change History of fall(s);Impaired balance/gait;Impaired mobility History of fall(s);Impaired balance/gait;Impaired mobility History of fall(s);Impaired balance/gait  Fall risk Follow up Falls evaluation completed  Falls evaluation completed;Education provided;Falls prevention discussed Falls evaluation completed;Education provided;Falls prevention discussed Falls evaluation completed;Education provided;Falls prevention discussed Falls evaluation completed;Education provided     Data saved with a previous flowsheet row definition  Functional Status Survey:    Vitals:   08/22/23 1353  BP: 132/79  Pulse: 70  Temp: 97.8 F (36.6 C)  SpO2: 93%  Weight: 143 lb 6.4 oz (65 kg)  Height: 5' 5 (1.651 m)   Body mass index is 23.86 kg/m. Physical Exam Vitals reviewed.  Constitutional:      Appearance: Normal appearance.  HENT:     Head: Normocephalic.     Nose: Nose normal.     Mouth/Throat:     Mouth: Mucous membranes are moist.     Pharynx: Oropharynx is clear.     Comments: Some redness on left side of her face Eyes:     Pupils: Pupils are equal, round, and reactive to light.  Cardiovascular:     Rate and Rhythm: Normal rate and regular rhythm.     Pulses: Normal pulses.     Heart sounds: Normal heart sounds. No murmur heard. Pulmonary:     Effort: Pulmonary effort is normal.      Breath sounds: Normal breath sounds.  Abdominal:     General: Abdomen is flat. Bowel sounds are normal.     Palpations: Abdomen is soft.  Musculoskeletal:        General: No swelling.     Cervical back: Neck supple.  Skin:    General: Skin is warm.  Neurological:     General: No focal deficit present.     Mental Status: She is alert.     Comments: Left AKA  Psychiatric:        Mood and Affect: Mood normal.        Thought Content: Thought content normal.     Labs reviewed: Recent Labs    09/28/22 0000 07/19/23 0000  NA 139 139  K 4.0 4.0  CL 104 107  CO2 25* 23*  BUN 30* 18  CREATININE 0.5 0.6  CALCIUM 8.8 9.0   Recent Labs    07/19/23 0000  AST 9*  ALT 8  ALKPHOS 33  ALBUMIN 3.8   No results for input(s): WBC, NEUTROABS, HGB, HCT, MCV, PLT in the last 8760 hours. Lab Results  Component Value Date   TSH 0.97 07/19/2023   Lab Results  Component Value Date   HGBA1C 5.8 07/19/2023   Lab Results  Component Value Date   CHOL 188 12/26/2021   HDL 60 12/26/2021   LDLCALC 106 12/26/2021   TRIG 119 12/26/2021    Significant Diagnostic Results in last 30 days:  No results found.  Assessment/Plan 1. Type 2 diabetes mellitus with peripheral vascular disease (HCC) (Primary) A1 now 5.8 Probably due to Weight loss Off All meds  2. Frontotemporal dementia with behavioral disturbance (HCC) Total care On Depakote and Seroquel  Enrolled in hospice Labs done  were normal  3. Hx of BKA, left (HCC)   4. Essential hypertension Off All meds  5. Acquired hypothyroidism TSH normal  6. Depression, recurrent (HCC) On Celexa   7. Hyponatremia On sodium tabs    Family/ staff Communication:   Labs/tests ordered:

## 2023-09-04 ENCOUNTER — Encounter: Payer: Self-pay | Admitting: Orthopedic Surgery

## 2023-09-04 ENCOUNTER — Non-Acute Institutional Stay (SKILLED_NURSING_FACILITY): Admitting: Orthopedic Surgery

## 2023-09-04 DIAGNOSIS — R634 Abnormal weight loss: Secondary | ICD-10-CM

## 2023-09-04 DIAGNOSIS — F02818 Dementia in other diseases classified elsewhere, unspecified severity, with other behavioral disturbance: Secondary | ICD-10-CM | POA: Diagnosis not present

## 2023-09-04 DIAGNOSIS — F418 Other specified anxiety disorders: Secondary | ICD-10-CM | POA: Diagnosis not present

## 2023-09-04 DIAGNOSIS — G3109 Other frontotemporal dementia: Secondary | ICD-10-CM

## 2023-09-05 NOTE — Progress Notes (Signed)
 Location:  [facility] Room Number: 29-A Place of Service:  SNF [phone]) Provider:  Greig FORBES Cluster, NP   Charlanne Fredia CROME, MD  Patient Care Team: Charlanne Fredia CROME, MD as PCP - General (Internal Medicine)  Extended Emergency Contact Information Primary Emergency Contact: [facility] Phone: [phone] Relation: Sister Secondary Emergency Contact: [facility] Phone: [phone] Mobile Phone: [phone] Relation: Brother  Code Status:   Goals of care: Advanced Directive information    09/04/2023    3:50 PM  Advanced Directives  Does Patient Have a Medical Advance Directive? Yes  Type of Estate agent of [place];Out of facility DNR (pink MOST or yellow form)  Does patient want to make changes to medical advance directive? No - Patient declined  Copy of Healthcare Power of Attorney in Chart? Yes - validated most recent copy scanned in chart (See row information)     Chief Complaint  Patient presents with   Acute Visit    Weight loss and anxiety     HPI:  Pt is a 73 y.o. female seen today for acute visit due to weight loss and increased anxiety.   She currently resides on the skilled nursing unit at [facility] due to frontotemporal dementia. PMH: HTN, hypothyroidism, T2DM, alcohol abuse, psychosis, wernicke-korsakoff syndrome, thrombocytopenia.     HPOA esta reports finding sister with eyes open in panic x 2 weeks. She has also been observed with fake sneezing, but in a lower voice. Goals of care dicussed with HPOA, she does not want her to feel scared or anxious. She is followed by hospice due to frontotemporal dementia. In addition, she is sleeping more during meals. She has recently lost some weight. She is taking Seroquel  twice daily for behaviors associated with dementia. No recent agitation. She is feeding assist at this time. She is also receiving Boost. See weight trends below. Vitals stable.   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 No pertinent surgical history.  No Known Allergies  Outpatient Encounter Medications as of 09/04/2023  Medication Sig   acetaminophen  (TYLENOL ) 500 MG tablet Take 1,000 mg by mouth 2 (two) times daily as needed for fever (pain).   acetaminophen  (TYLENOL ) 500 MG tablet Take 1,000 mg by mouth 2 (two) times daily.   Camphor-Menthol-Methyl Sal (SALONPAS) 3.02-19-08 % PTCH Place 1 patch onto the skin See admin instructions. Apply one patch transdermally to left aka stump daily as needed for pain   CITALOPRAM  HYDROBROMIDE PO Take 15 mg by mouth in the morning.   divalproex (DEPAKOTE SPRINKLE) 125 MG capsule Take 250 mg by mouth 3 (three) times daily.   Docusate Sodium  100 MG capsule Take 100 mg by mouth 2 (two) times daily.   guaiFENesin (ROBITUSSIN) 100 MG/5ML liquid Take 10 mLs by mouth every  4 (four) hours as needed for cough or congestion.   hydrocortisone  1 % lotion Apply 1 Application topically as needed for itching Mervyn radish and jawlines for lesions and dryness).   hydrocortisone  cream 0.5 % Apply 1 Application topically daily. Apply to hairline daily   KETOCONAZOLE , TOPICAL, 1 % SHAM Apply 1 Application topically once a week. Thursday.   levothyroxine  (SYNTHROID )  50 MCG tablet Take 50 mcg by mouth daily before breakfast.   LORazepam  (ATIVAN ) 0.5 MG tablet Take 1 tablet (0.5 mg total) by mouth every 12 (twelve) hours as needed for anxiety.   melatonin 3 MG TABS tablet Take 3 mg by mouth daily.   nystatin  (MYCOSTATIN /NYSTOP ) powder Apply 1 Application topically as needed.   QUEtiapine  (SEROQUEL ) 25 MG tablet Take 25 mg by mouth every morning.   QUEtiapine  (SEROQUEL ) 50 MG tablet Take 50 mg by mouth at bedtime.   sodium chloride  1 g tablet Take 1 g by mouth once.   Sodium Fluoride (PREVIDENT 5000 BOOSTER PLUS) 1.1 % PSTE Place onto teeth at bedtime. Brush on teeth with a toothbrush after PM mouth care. Spit out excess and do not rinse   zinc  oxide 20 % ointment Apply 1 application  topically as needed (apply to buttocks for redness).   lactose free nutrition (BOOST) LIQD Take 237 mLs by mouth 3 (three) times daily. After or between meals for diet supplement (Patient not taking: Reported on 09/04/2023)   NYAMYC  powder Apply 1 application  topically as needed (groin rash). (Patient not taking: Reported on 09/04/2023)   polyethylene glycol (MIRALAX  / GLYCOLAX ) 17 g packet Take 17 g by mouth as needed for mild constipation. (Patient not taking: Reported on 09/04/2023)   No facility-administered encounter medications on file as of 09/04/2023.    Review of Systems  Unable to perform ROS: Dementia    Immunization History  Administered Date(s) Administered   Fluad Quad(high Dose 65+) 12/07/2021   Influenza, High Dose Seasonal PF 12/13/2022   Influenza-Unspecified 11/11/2019, 11/11/2020   Janssen (J&J) SARS-COV-2 Vaccination 01/13/2022   Moderna Covid-19 Fall Seasonal Vaccine 79yrs & older 06/06/2023   Moderna Covid-19 Vaccine Bivalent Booster 79yrs & up 12/20/2022   Moderna SARS-COV2 Booster Vaccination 02/02/2021   Moderna Sars-Covid-2 Vaccination 03/06/2019, 04/03/2019, 12/18/2019, 11/11/2020   PPD Test 05/05/2019   Pfizer Covid-19 Vaccine Bivalent Booster  94yrs & up 07/27/2021   Pneumococcal Conjugate-13 12/09/2016   Pneumococcal Polysaccharide-23 12/09/2013   Tdap 03/12/2012   Zoster Recombinant(Shingrix) 11/26/2012, 01/13/2022   Zoster, Live 11/26/2012   Pertinent  Health Maintenance Due  Topic Date Due   INFLUENZA VACCINE  09/14/2023   HEMOGLOBIN A1C  01/18/2024   OPHTHALMOLOGY EXAM  06/17/2024   FOOT EXAM  07/22/2024   MAMMOGRAM  Discontinued   DEXA SCAN  Discontinued   Colonoscopy  Discontinued      02/17/2022    1:22 PM 05/22/2022    1:02 PM 12/22/2022    3:01 PM 01/19/2023   12:46 PM 07/23/2023    1:50 PM  Fall Risk  Falls in the past year? 0 0 0 0 0  Was there an injury with Fall? 0 0 0 0 0  Fall Risk Category Calculator 0 0 0 0 0  Fall Risk Category (Retired) Low       (RETIRED) Patient Fall Risk Level Low fall risk       Patient at Risk for Falls Due to No Fall Risks History of fall(s);Impaired balance/gait;Impaired mobility;Mental status change History of fall(s);Impaired balance/gait;Impaired mobility History of  fall(s);Impaired balance/gait;Impaired mobility History of fall(s);Impaired balance/gait  Fall risk Follow up Falls evaluation completed  Falls evaluation completed;Education provided;Falls prevention discussed Falls evaluation completed;Education provided;Falls prevention discussed Falls evaluation completed;Education provided;Falls prevention discussed Falls evaluation completed;Education provided     Data saved with a previous flowsheet row definition   Functional Status Survey:    Vitals:   09/04/23 1529  BP: 125/72  Pulse: 81  Resp: 19  Temp: 98.2 F (36.8 C)  SpO2: 92%  Weight: 143 lb 6.4 oz (65 kg)  Height: 5' 5 (1.651 m)   Body mass index is 23.86 kg/m. Physical Exam Vitals reviewed.  Constitutional:      General: She is not in acute distress. HENT:     Head: Normocephalic.  Eyes:     General:        Right eye: No discharge.        Left eye: No discharge.  Cardiovascular:     Rate and  Rhythm: Normal rate and regular rhythm.     Pulses: Normal pulses.     Heart sounds: Normal heart sounds.  Pulmonary:     Effort: Pulmonary effort is normal.     Breath sounds: Normal breath sounds.  Abdominal:     General: Bowel sounds are normal. There is no distension.     Palpations: Abdomen is soft.     Tenderness: There is no abdominal tenderness.  Musculoskeletal:     Cervical back: Neck supple.     Comments: Left BKA  Skin:    General: Skin is warm.     Capillary Refill: Capillary refill takes less than 2 seconds.  Neurological:     General: No focal deficit present.     Mental Status: She is alert.  Psychiatric:        Mood and Affect: Mood normal.     Comments: aphasia     Labs reviewed: Recent Labs    09/28/22 0000 07/19/23 0000  NA 139 139  K 4.0 4.0  CL 104 107  CO2 25* 23*  BUN 30* 18  CREATININE 0.5 0.6  CALCIUM 8.8 9.0   Recent Labs    07/19/23 0000  AST 9*  ALT 8  ALKPHOS 33  ALBUMIN 3.8   No results for input(s): WBC, NEUTROABS, HGB, HCT, MCV, PLT in the last 8760 hours. Lab Results  Component Value Date   TSH 0.97 07/19/2023   Lab Results  Component Value Date   HGBA1C 5.8 07/19/2023   Lab Results  Component Value Date   CHOL 188 12/26/2021   HDL 60 12/26/2021   LDLCALC 106 12/26/2021   TRIG 119 12/26/2021    Significant Diagnostic Results in last 30 days:  No results found.  Assessment/Plan 1. Depression with anxiety (Primary) - per HPOA> appearing more frightful and anxious - will increase Celexa  to 20 mg daily  - cont Ativan  prn - bmp in 1 week  2. Frontotemporal dementia with behavioral disturbance (HCC) - followed by hospice - nonverbal or tics of fake sneezing - dependent with ADLs including feeding - lost 7 lbs in 2 months  - sleeping more during meals> ? Progression of disease - will reduce Seroquel  in AM to 12.5 mg  - cont Seroquel  in evening  3. Weight loss - BMI 23.86 - lost 7 lbs in 2 months   - sleeping more during meals  - see above - cont feeding assist - cont monthly weights - cont Boost after meals  - TSH in 1  week    Family/ staff Communication: plan discussed with patient and nurse  Labs/tests ordered:  bmp 1 week

## 2023-09-10 ENCOUNTER — Non-Acute Institutional Stay (SKILLED_NURSING_FACILITY): Admitting: Orthopedic Surgery

## 2023-09-10 ENCOUNTER — Encounter: Payer: Self-pay | Admitting: Orthopedic Surgery

## 2023-09-10 ENCOUNTER — Other Ambulatory Visit: Payer: Self-pay | Admitting: Orthopedic Surgery

## 2023-09-10 DIAGNOSIS — G3109 Other frontotemporal dementia: Secondary | ICD-10-CM

## 2023-09-10 DIAGNOSIS — Z515 Encounter for palliative care: Secondary | ICD-10-CM

## 2023-09-10 DIAGNOSIS — F02818 Dementia in other diseases classified elsewhere, unspecified severity, with other behavioral disturbance: Secondary | ICD-10-CM | POA: Diagnosis not present

## 2023-09-10 MED ORDER — MORPHINE SULFATE (CONCENTRATE) 20 MG/ML PO SOLN: 5.0000 mg | ORAL | 0 refills | Status: DC | PRN

## 2023-09-10 NOTE — Progress Notes (Signed)
 Location:  [facility] Room Number: 29/A Place of Service:  SNF [phone]) Provider:  Greig FORBES Cluster, NP   Charlanne Fredia CROME, MD  Patient Care Team: Charlanne Fredia CROME, MD as PCP - General (Internal Medicine)  Extended Emergency Contact Information Primary Emergency Contact: [facility] Phone: [phone] Relation: Sister Secondary Emergency Contact: [facility] Phone: [phone] Mobile Phone: [phone] Relation: Brother  Code Status:  DNR Goals of care: Advanced Directive information    09/04/2023    3:50 PM  Advanced Directives  Does Patient Have a Medical Advance Directive? Yes  Type of Estate agent of [place];Out of facility DNR (pink MOST or yellow form)  Does patient want to make changes to medical advance directive? No - Patient declined  Copy of Healthcare Power of Attorney in Chart? Yes - validated most recent copy scanned in chart (See row information)     Chief Complaint  Patient presents with   Acute Visit    Poor po intake    HPI:  Pt is a 73 y.o. female seen today for acute visit due to poor po intake.   She currently resides on the skilled nursing unit at [facility] due to frontotemporal dementia. PMH: HTN, hypothyroidism, T2DM, alcohol abuse, psychosis, wernicke-korsakoff syndrome, thrombocytopenia.   Progressive weight loss x 8 weeks. 07/22 she was sleeping more and eating less. Her Seroquel  in Am was decreased. 07/25 she was only staying awake to have a few sips of Boost. 07/26 and 07/27 she was unable to take po medication and eat/drink meals. She was able to have a few sips of Boost per chart review. Today, she remains asleep. Family and nursing staff unable to awaken her enough to eat or drink. She is followed by hospice. Goals of care discussed. Plan to stop po medications and start morphine  prn for comfort. Vitals stable.     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  No Known Allergies  Outpatient Encounter Medications as of 09/10/2023  Medication Sig   acetaminophen  (TYLENOL ) 500 MG tablet Take 1,000 mg by mouth 2 (two) times daily as needed for fever (pain).   acetaminophen  (TYLENOL ) 500 MG tablet Take 1,000 mg by mouth 2 (two) times daily.   Camphor-Menthol-Methyl Sal (SALONPAS) 3.02-19-08 % PTCH Place 1 patch onto the skin See admin instructions. Apply one patch transdermally to left aka stump daily as needed for pain   CITALOPRAM  HYDROBROMIDE PO Take 20 mg by mouth in the morning.   divalproex (DEPAKOTE SPRINKLE) 125 MG capsule Take 250 mg by mouth 3 (three) times daily.   Docusate Sodium  100 MG capsule Take 100 mg by mouth 2 (two) times daily.   guaiFENesin (ROBITUSSIN) 100 MG/5ML liquid Take 10 mLs by mouth every 4 (four) hours as needed for cough or congestion.   hydrocortisone  1 % lotion Apply 1 Application topically as  needed for itching Mervyn radish and jawlines for lesions and dryness).   hydrocortisone  cream 0.5 % Apply 1 Application topically daily. Apply to hairline daily   KETOCONAZOLE , TOPICAL, 1 % SHAM Apply 1 Application topically once a week. Thursday.   lactose free nutrition (BOOST) LIQD Take 237 mLs by mouth 3 (three) times daily. After or between meals for diet supplement   levothyroxine  (SYNTHROID ) 50 MCG tablet Take  50 mcg by mouth daily before breakfast.   LORazepam  (ATIVAN ) 0.5 MG tablet Take 1 tablet (0.5 mg total) by mouth every 12 (twelve) hours as needed for anxiety.   melatonin 3 MG TABS tablet Take 3 mg by mouth daily.   morphine  (ROXANOL) 20 MG/ML concentrated solution Take 0.25 mLs (5 mg total) by mouth every 4 (four) hours as needed for severe pain (pain score 7-10).   NYAMYC  powder Apply 1 application  topically as needed (groin rash).   nystatin  (MYCOSTATIN /NYSTOP ) powder Apply 1 Application topically as needed.   polyethylene glycol (MIRALAX  / GLYCOLAX ) 17 g packet Take 17 g by mouth as needed for mild constipation.   QUEtiapine  (SEROQUEL ) 25 MG tablet Take 12.5 mg by mouth every morning.   QUEtiapine  (SEROQUEL ) 50 MG tablet Take 50 mg by mouth at bedtime.   sodium chloride  1 g tablet Take 1 g by mouth once.   Sodium Fluoride (PREVIDENT 5000 BOOSTER PLUS) 1.1 % PSTE Place onto teeth at bedtime. Brush on teeth with a toothbrush after PM mouth care. Spit out excess and do not rinse   zinc  oxide 20 % ointment Apply 1 application  topically as needed (apply to buttocks for redness).   No facility-administered encounter medications on file as of 09/10/2023.    Review of Systems  Unable to perform ROS: Dementia    Immunization History  Administered Date(s) Administered   Fluad Quad(high Dose 65+) 12/07/2021   Influenza, High Dose Seasonal PF 12/13/2022   Influenza-Unspecified 11/11/2019, 11/11/2020   Janssen (J&J) SARS-COV-2 Vaccination 01/13/2022   Moderna Covid-19 Fall Seasonal Vaccine 45yrs & older 06/06/2023   Moderna Covid-19 Vaccine Bivalent Booster 60yrs & up 12/20/2022   Moderna SARS-COV2 Booster Vaccination 02/02/2021   Moderna Sars-Covid-2 Vaccination 03/06/2019, 04/03/2019, 12/18/2019, 11/11/2020   PPD Test 05/05/2019   Pfizer Covid-19 Vaccine Bivalent Booster 51yrs & up 07/27/2021   Pneumococcal Conjugate-13 12/09/2016   Pneumococcal Polysaccharide-23 12/09/2013   Tdap  03/12/2012   Zoster Recombinant(Shingrix) 11/26/2012, 01/13/2022   Zoster, Live 11/26/2012   Pertinent  Health Maintenance Due  Topic Date Due   INFLUENZA VACCINE  09/14/2023   HEMOGLOBIN A1C  01/18/2024   OPHTHALMOLOGY EXAM  06/17/2024   FOOT EXAM  07/22/2024   MAMMOGRAM  Discontinued   DEXA SCAN  Discontinued   Colonoscopy  Discontinued      02/17/2022    1:22 PM 05/22/2022    1:02 PM 12/22/2022    3:01 PM 01/19/2023   12:46 PM 07/23/2023    1:50 PM  Fall Risk  Falls in the past year? 0 0 0 0 0  Was there an injury with Fall? 0 0 0 0 0  Fall Risk Category Calculator 0 0 0 0 0  Fall Risk Category (Retired) Low       (RETIRED) Patient Fall Risk Level Low fall risk       Patient at Risk for Falls Due to No Fall Risks History of fall(s);Impaired balance/gait;Impaired mobility;Mental status change History of fall(s);Impaired balance/gait;Impaired mobility History of fall(s);Impaired balance/gait;Impaired mobility History of fall(s);Impaired balance/gait  Fall risk  Follow up Falls evaluation completed  Falls evaluation completed;Education provided;Falls prevention discussed Falls evaluation completed;Education provided;Falls prevention discussed Falls evaluation completed;Education provided;Falls prevention discussed Falls evaluation completed;Education provided     Data saved with a previous flowsheet row definition   Functional Status Survey:    Vitals:   09/10/23 1534  BP: 120/79  Pulse: 73  Resp: 18  Temp: 98.5 F (36.9 C)  SpO2: 98%  Weight: 143 lb 6.4 oz (65 kg)  Height: 5' 5 (1.651 m)   Body mass index is 23.86 kg/m. Physical Exam Vitals reviewed.  Constitutional:      General: She is not in acute distress. HENT:     Head: Normocephalic.  Eyes:     General:        Right eye: No discharge.        Left eye: No discharge.  Cardiovascular:     Rate and Rhythm: Normal rate and regular rhythm.     Pulses: Normal pulses.     Heart sounds: Normal heart sounds.   Pulmonary:     Effort: Pulmonary effort is normal.     Breath sounds: Normal breath sounds.  Musculoskeletal:     Cervical back: Neck supple.     Comments: Left BKA  Skin:    General: Skin is warm.     Capillary Refill: Capillary refill takes less than 2 seconds.  Neurological:     Mental Status: She is lethargic.     Motor: Weakness present.     Gait: Gait abnormal.     Labs reviewed: Recent Labs    09/28/22 0000 07/19/23 0000  NA 139 139  K 4.0 4.0  CL 104 107  CO2 25* 23*  BUN 30* 18  CREATININE 0.5 0.6  CALCIUM 8.8 9.0   Recent Labs    07/19/23 0000  AST 9*  ALT 8  ALKPHOS 33  ALBUMIN 3.8   No results for input(s): WBC, NEUTROABS, HGB, HCT, MCV, PLT in the last 8760 hours. Lab Results  Component Value Date   TSH 0.97 07/19/2023   Lab Results  Component Value Date   HGBA1C 5.8 07/19/2023   Lab Results  Component Value Date   CHOL 188 12/26/2021   HDL 60 12/26/2021   LDLCALC 106 12/26/2021   TRIG 119 12/26/2021    Significant Diagnostic Results in last 30 days:  No results found.  Assessment/Plan 1. End of life care (Primary) - weight decline x 8 weeks - not taking medications x 3 days - only taking a few sips Boost when awake - followed by hospice - discontinue po medications - cont Ativan  prn - start morphine  concentrate prn  2. Frontotemporal dementia with behavioral disturbance (HCC) - see above     Family/ staff Communication: plan discussed with patient and nurse  Labs/tests ordered:  none

## 2023-09-14 DEATH — deceased
